# Patient Record
Sex: Male | Born: 1945 | Race: White
Health system: Southern US, Community
[De-identification: ages and names within clinical notes are randomized; demographics above are authoritative.]

## PROBLEM LIST (undated history)

## (undated) VITALS — BP 199/94 | HR 69 | Temp 97.8°F | Resp 16

## (undated) VITALS — BP 220/136 | HR 77 | Temp 98.0°F | Resp 14

## (undated) DIAGNOSIS — H3552 Pigmentary retinal dystrophy: Secondary | ICD-10-CM

## (undated) DIAGNOSIS — I10 Essential (primary) hypertension: Secondary | ICD-10-CM

## (undated) DIAGNOSIS — E039 Hypothyroidism, unspecified: Secondary | ICD-10-CM

## (undated) DIAGNOSIS — H547 Unspecified visual loss: Secondary | ICD-10-CM

## (undated) DIAGNOSIS — H9190 Unspecified hearing loss, unspecified ear: Secondary | ICD-10-CM

## (undated) DIAGNOSIS — K5792 Diverticulitis of intestine, part unspecified, without perforation or abscess without bleeding: Secondary | ICD-10-CM

## (undated) DIAGNOSIS — G4700 Insomnia, unspecified: Secondary | ICD-10-CM

## (undated) DIAGNOSIS — E785 Hyperlipidemia, unspecified: Secondary | ICD-10-CM

## (undated) DIAGNOSIS — H93233 Hyperacusis, bilateral: Secondary | ICD-10-CM

## (undated) DIAGNOSIS — M545 Low back pain, unspecified: Secondary | ICD-10-CM

## (undated) DIAGNOSIS — I2510 Atherosclerotic heart disease of native coronary artery without angina pectoris: Secondary | ICD-10-CM

## (undated) HISTORY — DX: Insomnia, unspecified: G47.00

## (undated) HISTORY — DX: Atherosclerotic heart disease of native coronary artery without angina pectoris: I25.10

## (undated) HISTORY — DX: Hyperlipidemia, unspecified: E78.5

## (undated) HISTORY — DX: Low back pain, unspecified: M54.50

## (undated) HISTORY — DX: Diverticulitis of intestine, part unspecified, without perforation or abscess without bleeding: K57.92

## (undated) HISTORY — DX: Unspecified visual loss: H54.7

## (undated) HISTORY — DX: Hypothyroidism, unspecified: E03.9

## (undated) HISTORY — PX: OTHER SURGICAL HISTORY: SHX169

## (undated) HISTORY — DX: Essential (primary) hypertension: I10

---

## 1971-07-27 HISTORY — PX: APPENDECTOMY (OPEN): SHX54

## 1977-07-26 HISTORY — PX: EYE SURGERY: SHX253

## 1986-07-26 HISTORY — PX: OTHER SURGICAL HISTORY: SHX169

## 1989-07-26 HISTORY — PX: OTHER SURGICAL HISTORY: SHX169

## 1991-07-27 DIAGNOSIS — H9319 Tinnitus, unspecified ear: Secondary | ICD-10-CM

## 1991-07-27 HISTORY — DX: Tinnitus, unspecified ear: H93.19

## 1998-11-06 ENCOUNTER — Ambulatory Visit (HOSPITAL_COMMUNITY): Admission: RE | Admit: 1998-11-06 | Discharge: 1998-11-06 | Payer: Self-pay | Admitting: Internal Medicine

## 2000-02-23 ENCOUNTER — Encounter: Admission: RE | Admit: 2000-02-23 | Discharge: 2000-02-23 | Payer: Self-pay | Admitting: Internal Medicine

## 2000-02-23 ENCOUNTER — Encounter: Payer: Self-pay | Admitting: Internal Medicine

## 2000-07-27 ENCOUNTER — Ambulatory Visit
Admit: 2000-07-27 | Disposition: A | Discharge status: Home / Self Care | Payer: Self-pay | Source: Ambulatory Visit | Admitting: Internal Medicine

## 2000-09-23 HISTORY — PX: ROTATOR CUFF REPAIR: SHX139

## 2003-04-22 ENCOUNTER — Ambulatory Visit (HOSPITAL_COMMUNITY): Admission: RE | Admit: 2003-04-22 | Discharge: 2003-04-22 | Payer: Self-pay | Admitting: Internal Medicine

## 2003-11-25 ENCOUNTER — Ambulatory Visit: Admission: RE | Admit: 2003-11-25 | Payer: Self-pay | Source: Ambulatory Visit | Admitting: Gastroenterology

## 2004-12-25 ENCOUNTER — Encounter: Admission: RE | Admit: 2004-12-25 | Discharge: 2004-12-25 | Payer: Self-pay | Admitting: Internal Medicine

## 2005-01-25 ENCOUNTER — Encounter: Admission: RE | Admit: 2005-01-25 | Discharge: 2005-01-25 | Payer: Self-pay | Admitting: Internal Medicine

## 2005-01-27 ENCOUNTER — Encounter: Admission: RE | Admit: 2005-01-27 | Discharge: 2005-01-27 | Payer: Self-pay | Admitting: Internal Medicine

## 2005-03-25 ENCOUNTER — Encounter: Admission: RE | Admit: 2005-03-25 | Discharge: 2005-03-25 | Payer: Self-pay | Admitting: Internal Medicine

## 2005-06-03 ENCOUNTER — Encounter: Admission: RE | Admit: 2005-06-03 | Discharge: 2005-06-03 | Payer: Self-pay | Admitting: Internal Medicine

## 2005-07-20 ENCOUNTER — Encounter: Admission: RE | Admit: 2005-07-20 | Discharge: 2005-07-20 | Payer: Self-pay | Admitting: Internal Medicine

## 2005-07-26 HISTORY — PX: HYDROCELE EXCISION: SHX482

## 2005-08-18 ENCOUNTER — Ambulatory Visit (HOSPITAL_BASED_OUTPATIENT_CLINIC_OR_DEPARTMENT_OTHER): Admission: RE | Admit: 2005-08-18 | Discharge: 2005-08-18 | Payer: Self-pay | Admitting: Urology

## 2005-10-21 ENCOUNTER — Encounter: Admission: RE | Admit: 2005-10-21 | Discharge: 2005-10-21 | Payer: Self-pay | Admitting: Internal Medicine

## 2005-11-05 ENCOUNTER — Ambulatory Visit (HOSPITAL_COMMUNITY): Admission: RE | Admit: 2005-11-05 | Discharge: 2005-11-05 | Payer: Self-pay | Admitting: Internal Medicine

## 2006-11-22 ENCOUNTER — Ambulatory Visit: Admission: RE | Admit: 2006-11-22 | Payer: Self-pay | Source: Ambulatory Visit | Admitting: Surgery

## 2006-12-25 HISTORY — PX: EXCISION, SEBACEOUS CYST: SHX5759

## 2006-12-25 HISTORY — PX: EXCISION, CYST: SHX3959

## 2006-12-30 IMAGING — CT CT CHEST W/ CM
1 of 2 series · 15 of 31 positions shown, 19 images · IV contrast (75CC OMNI 300)
Comparison: [REDACTED] chest x-ray 12/25/04.

CLINICAL DATA: Cough.  Chest x-ray subpleural left upper lobe density.
LIMITED CT OF PARANASAL SINUSES:
TECHNIQUE: Limited coronal CT images were obtained through the paranasal sinuses without intravenous contrast.
TECHNIQUE: Multidetector CT imaging of the chest was performed following the standard protocol during bolus administration of intravenous contrast.
Contrast:  75 cc Omnipaque 300

[Series 4: routine chest · axial · 0.70mm/px · z∈[-300,+45]mm · 15 of 77 slices shown, 19 images]
[im 4/77  mediastinal]
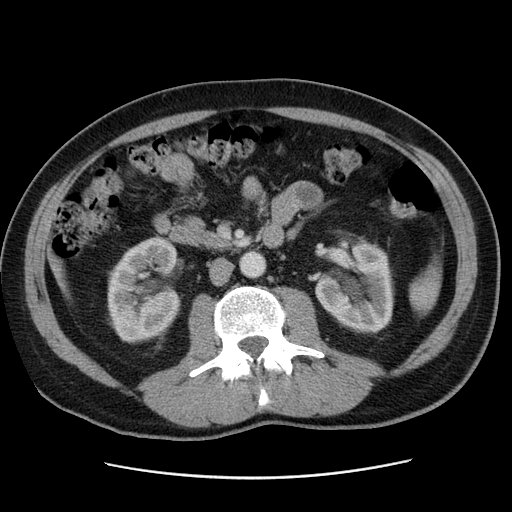
[im 4/77  lung]
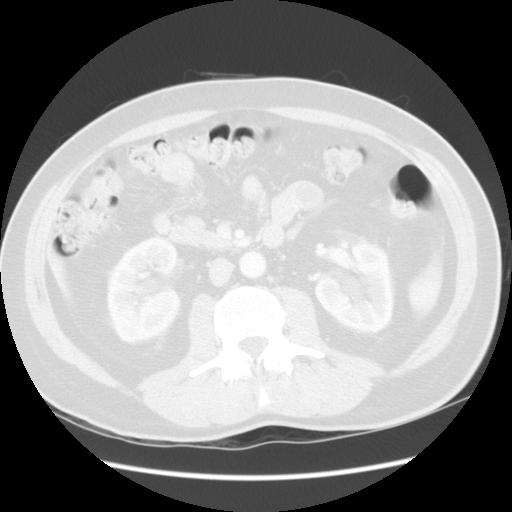
[im 11/77  lung]
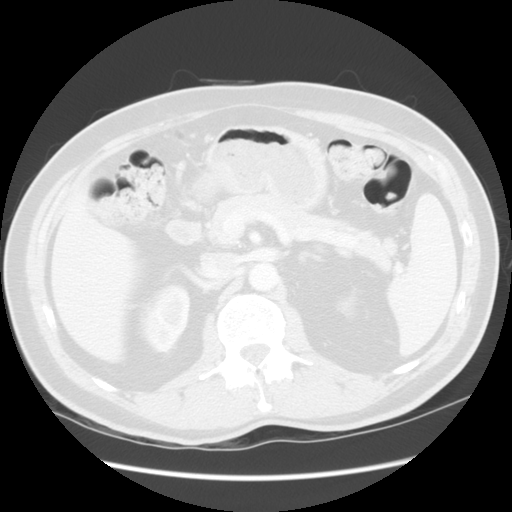
[im 15/77  lung]
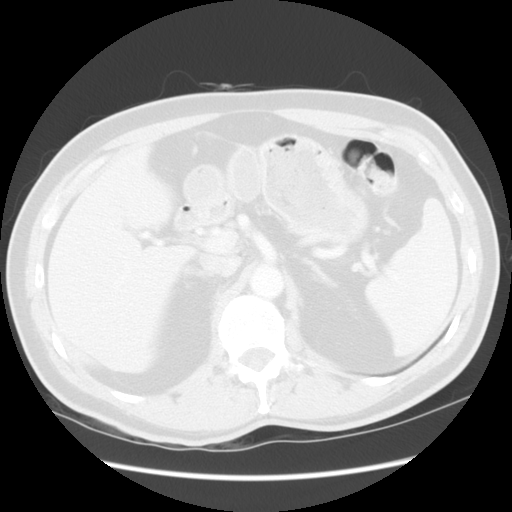
[im 20/77  lung]
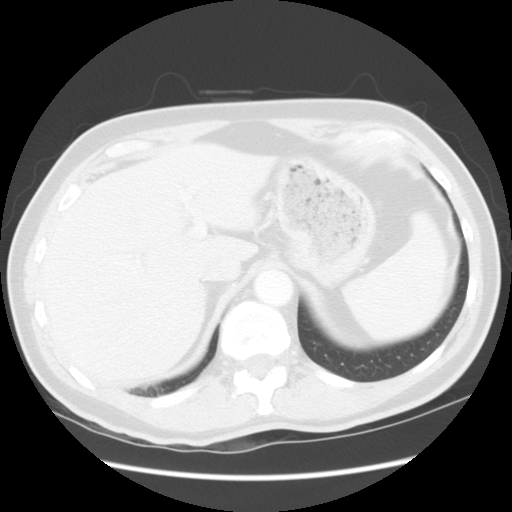
[im 22/77  mediastinal]
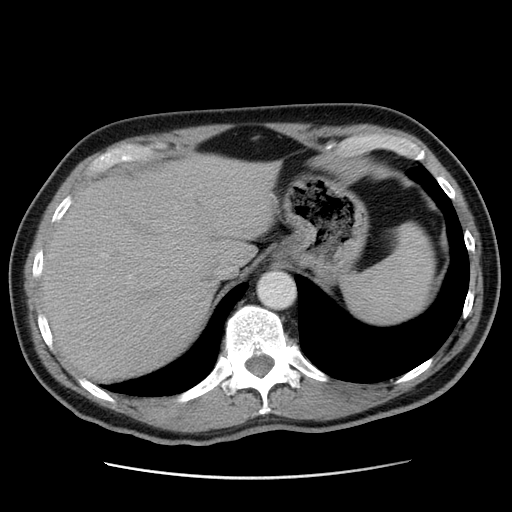
[im 22/77  lung]
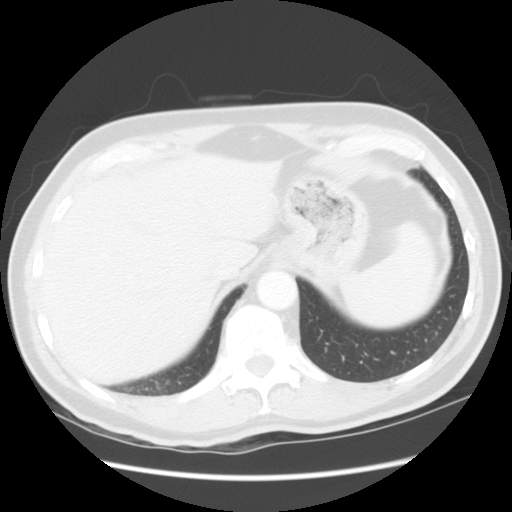
[im 29/77  lung]
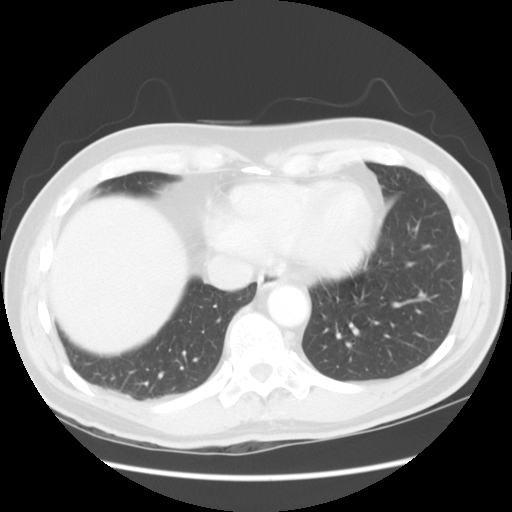
[im 33/77  lung]
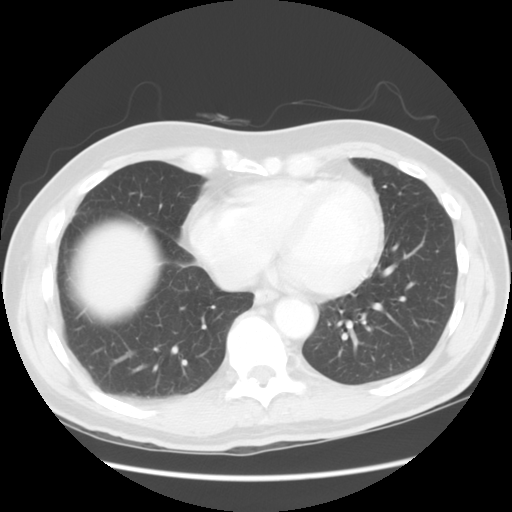
[im 39/77  lung]
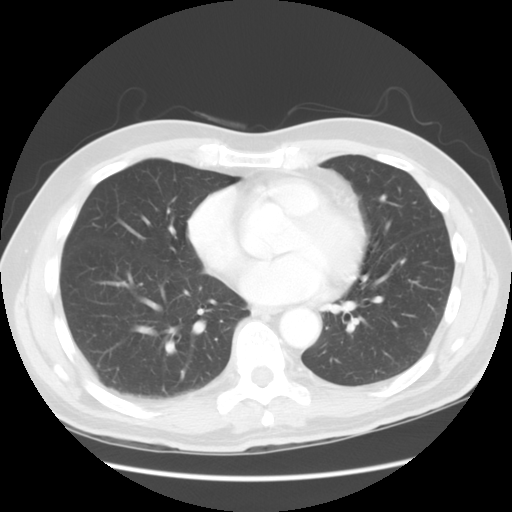
[im 41/77  mediastinal]
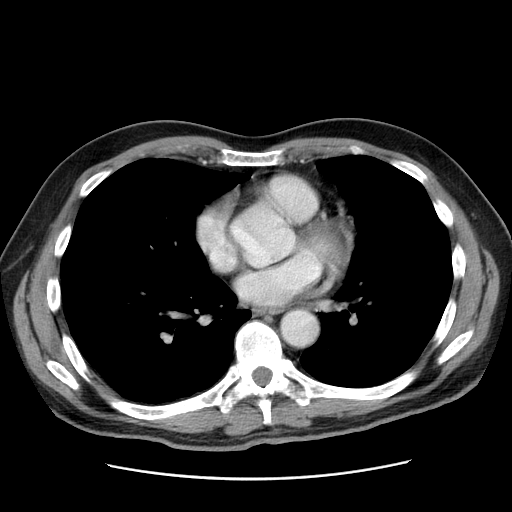
[im 41/77  lung]
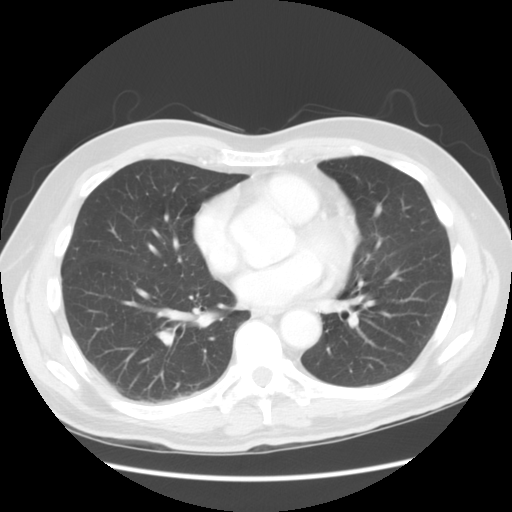
[im 44/77  lung]
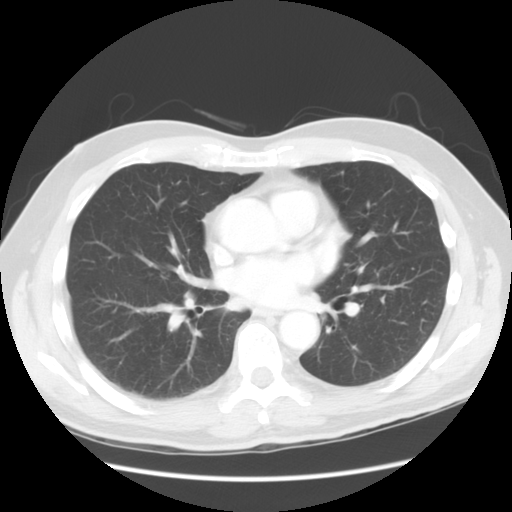
[im 51/77  lung]
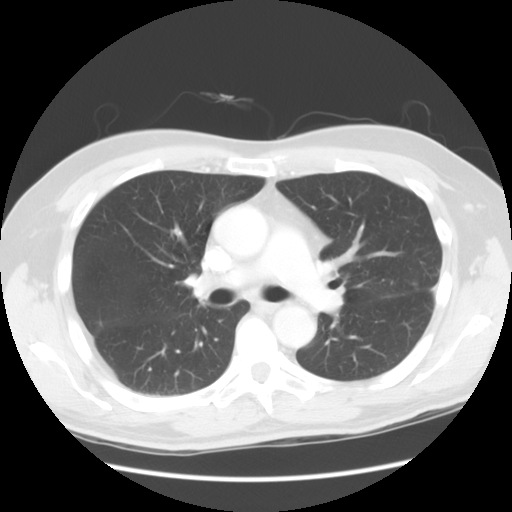
[im 55/77  lung]
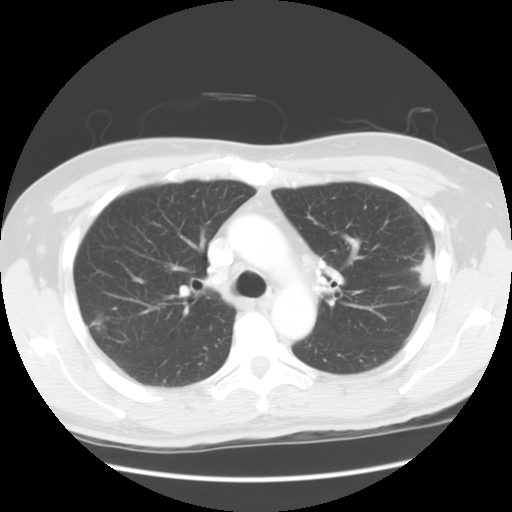
[im 62/77  mediastinal]
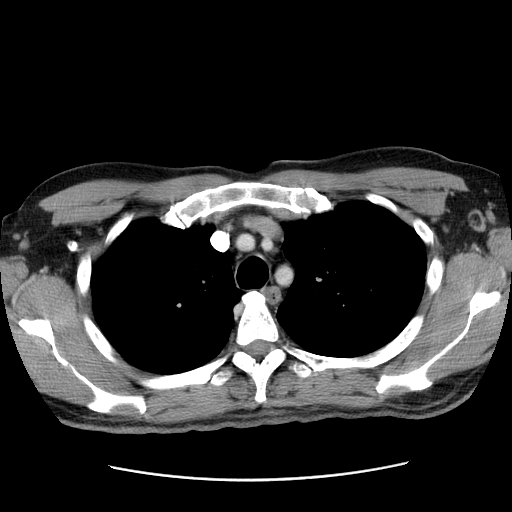
[im 62/77  lung]
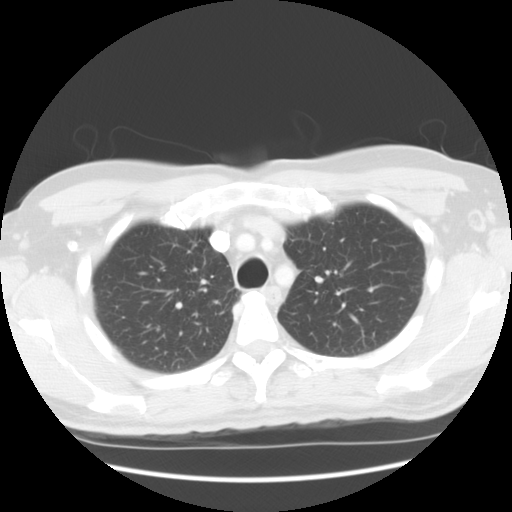
[im 66/77  lung]
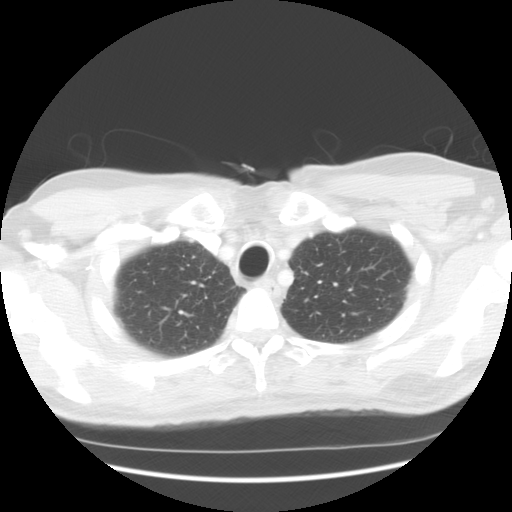
[im 73/77  lung]
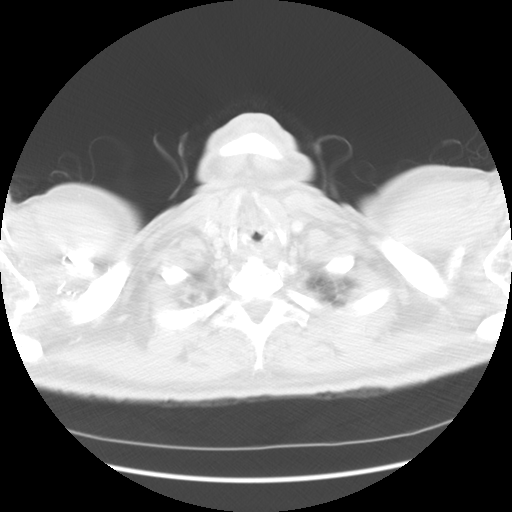

[15 of 31 positions shown; findings below may reference images not displayed]

FINDINGS: The paranasal sinuses are clear.  There is no evidence of mucosal thickening or air-fluid levels.  No significant bone abnormalities are identified.  Small right concha bullosa and slight leftward nasal septal deviation anatomic variants are seen.
IMPRESSION: 1.  Small right concha bullosa and slight leftward nasal septal deviation.
2.  Otherwise negative.
CHEST CT WITH CONTRAST:
FINDINGS: Since prior study there is approximately 2.5 cm long x 2.7 cm AP x 1.5 cm wide subpleural opacity at the left upper lobe (image 23).  Linear interstitial changes or minimal atelectasis are seen at the inferior medial aspect of this density.  Group of tiny fibronodular changes are seen at the subpleural inferior right upper lobe.  The lungs are otherwise clear.  No significant mediastinal, hilar, nor axillary mass/adenopathy is seen.  Heart size is normal.  3 mm benign calcification (probable granuloma) is seen at the right cardiophrenic angle (image 46).  Upper abdominal organs are unremarkable.
IMPRESSION: 1.  Chest x-ray finding corresponds to peripheral subpleural opacity as described.  Differential diagnosis includes both infectious inflammatory and neoplastic etiology.  ollowup chest CT in two to three months is advised as clinically indicated.
2.  Fibronodular changes ? probable postinflammatory at the right upper lobe. 
3.  Otherwise no significant abnormality.

## 2009-09-22 ENCOUNTER — Emergency Department: Admit: 2009-09-22 | Payer: Self-pay | Source: Emergency Department | Admitting: Emergency Medicine

## 2009-09-22 LAB — BASIC METABOLIC PANEL
BUN: 16 mg/dL (ref 8–20)
CO2: 28 mEq/L (ref 21–30)
Calcium: 9.3 mg/dL (ref 8.6–10.2)
Chloride: 101 mEq/L (ref 98–107)
Creatinine: 0.8 mg/dL (ref 0.6–1.5)
Glucose: 109 mg/dL — ABNORMAL HIGH (ref 70–100)
Potassium: 3.6 mEq/L (ref 3.6–5.0)
Sodium: 140 mEq/L (ref 136–146)

## 2009-09-22 LAB — CK: Creatine Kinase (CK): 125 U/L (ref 20–297)

## 2009-09-22 LAB — CBC AND DIFFERENTIAL
Baso(Absolute): 0 10*3/uL (ref 0.00–0.20)
Basophils: 0 % (ref 0–2)
Eosinophils Absolute: 0.2 10*3/uL (ref 0.00–0.70)
Eosinophils: 3 % (ref 0–5)
Hematocrit: 45.9 % (ref 42.0–52.0)
Hgb: 15.2 g/dL (ref 13.0–17.0)
Immature Granulocytes Absolute: 0.01 10*3/uL
Immature Granulocytes: 0 % (ref 0–1)
Lymphocytes Absolute: 1.6 10*3/uL (ref 0.50–4.40)
Lymphocytes: 27 % (ref 15–41)
MCH: 30.2 pg (ref 28.0–32.0)
MCHC: 33.1 g/dL (ref 32.0–36.0)
MCV: 91.3 fL (ref 80.0–100.0)
MPV: 10.3 fL (ref 9.4–12.3)
Monocytes Absolute: 0.4 10*3/uL (ref 0.00–1.20)
Monocytes: 7 % (ref 0–11)
Neutrophils Absolute: 3.7 10*3/uL
Neutrophils: 62 % (ref 52–75)
Platelets: 229 10*3/uL (ref 140–400)
RBC: 5.03 10*6/uL (ref 4.70–6.00)
RDW: 14 % (ref 12–15)
WBC: 5.87 10*3/uL (ref 3.50–10.80)

## 2009-09-22 LAB — I-STAT TROPONIN: i-STAT Troponin: 0.01 ng/mL (ref 0.00–0.09)

## 2009-09-22 LAB — GFR: EGFR: >60

## 2010-08-15 ENCOUNTER — Encounter: Payer: Self-pay | Admitting: Internal Medicine

## 2010-08-16 ENCOUNTER — Encounter: Payer: Self-pay | Admitting: Internal Medicine

## 2010-12-19 ENCOUNTER — Encounter: Payer: Self-pay | Admitting: Gastroenterology

## 2011-02-24 HISTORY — PX: COLONOSCOPY, DIAGNOSTIC (SCREENING): SHX174

## 2011-03-25 ENCOUNTER — Ambulatory Visit
Admission: RE | Admit: 2011-03-25 | Discharge: 2011-03-25 | Payer: Self-pay | Source: Ambulatory Visit | Attending: Gastroenterology | Admitting: Gastroenterology

## 2011-03-25 ENCOUNTER — Ambulatory Visit: Payer: Self-pay

## 2011-04-10 LAB — ECG 12-LEAD
Atrial Rate: 57 {beats}/min
P Axis: 50 degrees
P-R Interval: 142 ms
Q-T Interval: 432 ms
QRS Duration: 78 ms
QTC Calculation (Bezet): 420 ms
R Axis: -8 degrees
T Axis: 53 degrees
Ventricular Rate: 57 {beats}/min

## 2011-05-13 NOTE — Op Note (Signed)
DATE OF BIRTH:                        04/13/1946      ADMISSION DATE:                     11/22/2006            PATIENT LOCATION:                     [id number]            DATE OF PROCEDURE:                   11/22/2006      SURGEON:                            Frederich Chick, MD      ASSISTANT(S):                  PREOPERATIVE DIAGNOSIS:  EPIDERMAL INCLUSION CYSTS OF THE BACK (X4).            POSTOPERATIVE DIAGNOSIS:  EPIDERMAL INCLUSION CYSTS OF THE BACK (X4).            PROCEDURE:  INCISIONAL BIOPSY OF EPIDERMAL INCLUSION CYSTS OF THE BACK X4.            ANESTHESIA:  LMA general anesthesia.            INDICATIONS FOR OPERATION:  The patient is a 65 year old gentleman who for      the past several years has had slowly enlarging lumps on his back.  The      largest has achieved the size of an egg yolk.  The patient presented      himself to me and I developed with him the indications and rational for      proceeding for excisional biopsies.  We discussed its potential      complications, bleeding, infection, seroma formation and the cosmetic      debits associated with the incisions.  We also talked about arrhythmias,      myocardial infarction, stroke, deep venous thrombosis, pulmonary emboli and      so forth.  The patient consented to the operation understanding the      aforementioned.            DESCRIPTION OF PROCEDURE:  The patient was given 2 g of Ancef antibiotic      prophylaxis, brought to the operating room and general LMA anesthesia      induced.  He was rolled to the prone position with banana peels      appropriately placed.  The back was prepped and draped in a standard      fashion.  I used a skin marking pen to outline the intended to elliptical      incisions about each of the lesions including the comedos were evident.  I      began with the upper back lesion and using a #15 blade, made the incision.      Flaps were raise in a cephalad and caudad direction.  When I got beyond the      limits of the  lesion, I deepened the dissection and excised the lesion in      toto.  It was submitted to pathology as a definitive specimen.  It measured  1.3 x 1.1 cm.  It was labeled epidermal inclusion cyst of the upper back      (a).  I then proceeded to excise the largest one in an identical fashion.      It was submitted as epidermal inclusion cyst mid back right (b).  I then      proceeded to excise the third one.  It was submitted as epidermal inclusion      cyst of the right lower back (c) and then did the epidermal inclusion cyst      left mid back specimen (d).  The largest measured about 4.2 x 3.5 cm.  The      other 2 measured 2.3 x 2.1 cm and 1.1 x 1.0 cm.  All wounds were closed      with multiple interrupted subcutaneous 3-0 Vicryl, the component of which      snagged the base to obliterate dead space.  All areas were infiltrated with      about 10 mL of 0.5% Marcaine for purposes of postoperative analgesia.  The      skin was approximated with multiple interrupted 4-0 nylon vertical mattress      sutures.  Bacitracin ointment was applied to the suture line.  Dry sterile      dressings were put in place and the patient returned to the recovery area      in good condition.            ESTIMATED BLOOD LOSS:  Less than 10 mL for all the lesions.            REPLACEMENT:  None.            COMPLICATIONS:  None.            COUNTS:  Sponge and needle counts reported correct x2.                                          Electronic Signing MD: Frederich Chick, MD  ([id number])            D: 11/22/2006 by Frederich Chick, MD      T: 11/22/2006 by [id number] ([id number]) ([id number])      cc:  Frederich Chick, MD          Velna Hatchet, MD

## 2011-06-07 ENCOUNTER — Ambulatory Visit
Admission: RE | Admit: 2011-06-07 | Discharge: 2011-06-07 | Disposition: A | Discharge status: Home / Self Care | Payer: Medicare Other | Source: Ambulatory Visit | Attending: Internal Medicine | Admitting: Internal Medicine

## 2011-06-07 ENCOUNTER — Other Ambulatory Visit: Payer: Self-pay | Admitting: Internal Medicine

## 2011-06-07 DIAGNOSIS — R05 Cough: Secondary | ICD-10-CM

## 2011-07-13 NOTE — Op Note (Signed)
Introduction: [id number] Document ID: [id number] -- 65 year old male      patient presents for an outpatient Colonoscopy on 03/25/2011.            Indications: Average-risk screening for colon cancer and colonic polyps      (V76.51).            Consent: The benefits, risks, and alternatives to the procedure were      discussed and informed consent was obtained from the patient.            Preparation: EKG, pulse, pulse oximetry, and blood pressure were monitored      throughout the procedure.            Medications: Fentanyl 100 mcg IV throughout the procedure. Versed 2 mg IV      throughout the procedure.            Rectal Exam: No masses.            Procedure: The colonoscope was passed through the anus under direct      visualization and was advanced without difficulty to the cecum, confirmed      by landmarks, appendiceal orifice, and ileocecal valve. The scope was      withdrawn and the mucosa was carefully examined. The quality of the      preparation was good. The views were good. The patient's toleration of the      procedure was good.            Estimated Blood Loss: Negligible.            Findings: Two polyps, measuring 4 mm and 4 mm in size, were found in the      transverse colon.   The polyps were removed by cold biopsy polypectomy.      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 Small internal hemorrhoids were      found. There was evidence of moderately severe diverticulosis in the      descending colon and sigmoid colon.  Otherwise, the colon appeared to be      normal.            Unplanned Events: There were no unplanned events.            Summary: Two polyps were found in the transverse colon (211.3); removed by      cold biopsy polypectomy. 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      Internal hemorrhoids found (455.0). Moderately severe  diverticulosis      (562.10) found in the descending colon and sigmoid colon.            Recommendations: Follow-up on the results of the biopsy specimens. Repeat      colonoscopyin 4 years if either polyp is a tubular adenoma. If neither is      adenomatous, can defer nest exam for 7 to 10 years.            Procedure Codes: [45380]Colonoscopy with biopsy            Performed By: The procedure was performed by.      Version 1, electronically signed by Dr. Hortencia Pilar on 03/25/2011 at      12:11.      [id number]

## 2011-09-16 ENCOUNTER — Ambulatory Visit: Payer: Medicare Other | Attending: Orthopaedic Surgery

## 2011-09-16 ENCOUNTER — Other Ambulatory Visit: Payer: Self-pay | Admitting: Orthopaedic Surgery

## 2011-09-16 DIAGNOSIS — IMO0002 Reserved for concepts with insufficient information to code with codable children: Secondary | ICD-10-CM

## 2011-09-16 DIAGNOSIS — M25569 Pain in unspecified knee: Secondary | ICD-10-CM | POA: Insufficient documentation

## 2011-09-16 DIAGNOSIS — M7120 Synovial cyst of popliteal space [Baker], unspecified knee: Secondary | ICD-10-CM | POA: Insufficient documentation

## 2011-09-16 DIAGNOSIS — M25469 Effusion, unspecified knee: Secondary | ICD-10-CM | POA: Insufficient documentation

## 2011-09-24 HISTORY — PX: KNEE ARTHROSCOPY: SUR90

## 2012-06-01 ENCOUNTER — Other Ambulatory Visit: Payer: Self-pay | Admitting: Internal Medicine

## 2012-07-28 NOTE — Op Note (Unsigned)
DATE OF BIRTH:                        11/29/1945      ADMISSION DATE:                     11/25/2003            PATIENT LOCATION:                     [facility] END 03            DATE OF PROCEDURE:                   11/25/2003      SURGEON:                            Homero Fellers, MD      ASSISTANT(S):                  PREOPERATIVE DIAGNOSIS:  ROUTINE COLORECTAL CANCER SCREENING IN A PATIENT      WITH NORMAL AGE-RELATED RISK FOR COLON CANCER.            POSTOPERATIVE DIAGNOSES      1.   DIMINUTIVE DESCENDING COLON POLYP.      2.   MODERATE LEFT-SIDED DIVERTICULOSIS.      3.   MILDLY CONGESTED INTERNAL HEMORRHOIDS.            PROCEDURE:  COLONOSCOPY.            INSTRUMENT:  Olympus video colonoscope.            PREMEDICATIONS:  None.            DESCRIPTION OF PROCEDURE:  Perianal inspection was unrevealing.  Digital      exam revealed no masses.  The scope was passed transanally and further      advanced to the base of the cecum (photo documentation).  The ileocecal      valve was well seen (photo documentation).  Bowel prep was good.  The scope      was slowly withdrawn.  A 3-4 mm very subtle polyp in the distal descending      colon (photo documentation) was excised with cold biopsy forceps (A).      Moderate left-sided diverticulosis was present.  The examination was      otherwise unremarkable except for mildly congested internal hemorrhoids      visualized on withdrawal.  Retroflexion in the anorectum (photo      documentation) was otherwise unrevealing.  The scope was withdrawn.  The      patient was tolerated well without overt complication.            RECOMMENDATION:  A surveillance colonoscopy will be recommended in 5 years      if the excised polyp is a tubular adenoma and in 3 years if it has any      villous component.  If it is nonadenomatous, a next screening colonoscopy      may be deferred for up to 10 years according to current consensus      guidelines.  ___________________________________          Date Signed: __________      Homero Fellers, MD  ([id number])            D: 11/25/2003 by Homero Fellers, MD      T: 11/25/2003 by [id number] ([id number]) ([id number])      cc:  Velna Hatchet, MD          Homero Fellers, MD

## 2012-11-28 ENCOUNTER — Other Ambulatory Visit: Payer: Self-pay | Admitting: Internal Medicine

## 2013-01-15 ENCOUNTER — Other Ambulatory Visit: Payer: Self-pay | Admitting: Internal Medicine

## 2013-01-15 NOTE — Telephone Encounter (Signed)
Contact pt please, he has not established here yet and we cant keep prescribing sleep meds,     He needs to make an appt    TY    Althea Grimmer. [name], M.D.

## 2013-01-16 MED ORDER — ESZOPICLONE 2 MG PO TABS
2.0000 mg | ORAL_TABLET | Freq: Every evening | ORAL | Status: DC
Start: 2013-01-15 — End: 2013-04-19

## 2013-01-17 ENCOUNTER — Encounter: Payer: Self-pay | Admitting: Internal Medicine

## 2013-01-17 NOTE — Progress Notes (Signed)
Eszopiclone 2 mg tablet called into [facility] on file. Pt did not receive mail order rx yet. Verbal order given for 7 tablets with no refill.

## 2013-01-18 ENCOUNTER — Encounter: Payer: Self-pay | Admitting: Internal Medicine

## 2013-01-18 ENCOUNTER — Ambulatory Visit: Payer: Medicare Other | Admitting: Internal Medicine

## 2013-01-18 VITALS — BP 180/90 | HR 76 | Temp 98.0°F | Ht 74.0 in | Wt 188.0 lb

## 2013-01-18 DIAGNOSIS — G4700 Insomnia, unspecified: Secondary | ICD-10-CM

## 2013-01-18 DIAGNOSIS — IMO0001 Reserved for inherently not codable concepts without codable children: Secondary | ICD-10-CM

## 2013-01-18 DIAGNOSIS — E785 Hyperlipidemia, unspecified: Secondary | ICD-10-CM

## 2013-01-18 DIAGNOSIS — H9313 Tinnitus, bilateral: Secondary | ICD-10-CM

## 2013-01-18 NOTE — Progress Notes (Signed)
Subjective:       Patient ID: Frederick [name] is a 67 y.o. male.    HPI    This 66 year old male presents to the office for a refill of medication for sleep. He has a chronic problem with insomnia and has required Lunesta. The patient was last seen in March, 2013. Due to running out of medication, he has not been sleeping and apparently his blood pressure has elevated. The patient has a history of labile pressures but it has never been consistently high and he has not been on antihypertensives.    The following portions of the patient's history were reviewed and updated as appropriate: medical history, allergies, medication list, social history, and problem list.      Review of Systems   Constitutional: Positive for fatigue.   HENT: Positive for hearing loss and tinnitus.    Respiratory: Negative for chest tightness and shortness of breath.    Cardiovascular: Negative for palpitations.   Neurological: Negative for light-headedness and headaches.   All other systems reviewed and are negative.            Objective:    Physical Exam   Vitals reviewed.  Constitutional: He is oriented to person, place, and time. He appears well-developed and well-nourished.   HENT:   Head: Normocephalic and atraumatic.   Right Ear: Tympanic membrane normal. No decreased hearing is noted.   Left Ear: Tympanic membrane normal. No decreased hearing is noted.   Eyes: Pupils are equal, round, and reactive to light.   Cardiovascular: Normal rate, regular rhythm and normal heart sounds.    Pulmonary/Chest: Effort normal and breath sounds normal.   Abdominal: Soft. Bowel sounds are normal.   Musculoskeletal: He exhibits no edema.   Neurological: He is alert and oriented to person, place, and time.   Skin: Skin is warm and dry.   Psychiatric: He has a normal mood and affect.           Assessment:       Insomnia  Labile blood pressure  Chronic tinnitus  Hyperlipidemia      Plan:       Patient has been provided a refill of his sleeping medication  - Lunesta. After getting back to a more normal sleep pattern, he is to keep a log of blood pressure readings. He will return to the office for re-evaluation and updated lab work.    Althea Grimmer. [name], M.D.    [facility]  [address]  [place], [place] [address]    Office [phone]  Fax     [phone]

## 2013-02-22 ENCOUNTER — Ambulatory Visit: Payer: Medicare Other | Admitting: Internal Medicine

## 2013-02-22 ENCOUNTER — Encounter: Payer: Self-pay | Admitting: Internal Medicine

## 2013-02-22 VITALS — BP 150/90 | HR 76 | Ht 74.0 in | Wt 188.0 lb

## 2013-02-22 DIAGNOSIS — G4700 Insomnia, unspecified: Secondary | ICD-10-CM

## 2013-02-22 DIAGNOSIS — H9313 Tinnitus, bilateral: Secondary | ICD-10-CM

## 2013-02-22 DIAGNOSIS — Z125 Encounter for screening for malignant neoplasm of prostate: Secondary | ICD-10-CM

## 2013-02-22 DIAGNOSIS — R0989 Other specified symptoms and signs involving the circulatory and respiratory systems: Secondary | ICD-10-CM

## 2013-02-22 DIAGNOSIS — M720 Palmar fascial fibromatosis [Dupuytren]: Secondary | ICD-10-CM

## 2013-02-22 DIAGNOSIS — E785 Hyperlipidemia, unspecified: Secondary | ICD-10-CM

## 2013-02-22 DIAGNOSIS — R030 Elevated blood-pressure reading, without diagnosis of hypertension: Secondary | ICD-10-CM

## 2013-02-22 DIAGNOSIS — H93233 Hyperacusis, bilateral: Secondary | ICD-10-CM

## 2013-02-22 DIAGNOSIS — H93239 Hyperacusis, unspecified ear: Secondary | ICD-10-CM

## 2013-02-22 DIAGNOSIS — F5104 Psychophysiologic insomnia: Secondary | ICD-10-CM

## 2013-02-22 DIAGNOSIS — H9319 Tinnitus, unspecified ear: Secondary | ICD-10-CM

## 2013-02-22 NOTE — Progress Notes (Signed)
Subjective:       Patient ID: Frederick [name] is a 67 y.o. male.    HPI      This 67 year old male, well known to me, presents to the office for evaluation of multiple medical issues. He does not smoke and consumes alcohol in moderation. He has resumed efforts to work on diet, weight management, and exercise.    Patient Active Problem List   Diagnosis   . Hyperlipidemia       Lipid profile managed with atorvastatin 10 mg and attention to diet. No apparent adverse effects of the statin agent.     . Insomnia       Chronic sleep disorder, most successfully controled with use of Lunesta qhs.     . Tinnitus        Likely related to noise exposure, persistent and very disruptive, contributes to sleep issues     . Labile blood pressure        Patient brings in an extensive log of recent readings which shows a much better average that readings here. Salt and caffeine intake are limited. Denies headaches, dizziness, chest pain, sob, fluid retention     . Dupuytren's contracture of both hands        Has had prior procedures. Problem is recurrent with flexion deformities of 4th and 5th digits of both hands     . Hyperacusis of both ears       Heightened sensitivity at certain frequencies despite general hearing loss         The following portions of the patient's history were reviewed and updated as appropriate: medical history, allergies, medication list, social history, family history, and problem list.      Review of Systems   Constitutional: Positive for fatigue. Negative for fever and unexpected weight change.   HENT: Positive for hearing loss and tinnitus. Negative for ear pain.    Respiratory: Negative for chest tightness and shortness of breath.    Cardiovascular: Negative for chest pain, palpitations and leg swelling.   Genitourinary: Negative for difficulty urinating.   Neurological: Negative for dizziness, light-headedness and headaches.   All other systems reviewed and are negative.            Objective:     Physical Exam   Vitals reviewed.  Constitutional: He is oriented to person, place, and time. He appears well-developed and well-nourished.   HENT:   Head: Normocephalic and atraumatic.   Right Ear: Decreased hearing is noted.   Left Ear: Decreased hearing is noted.   Mouth/Throat: Oropharynx is clear and moist.   Eyes: EOM are normal. Pupils are equal, round, and reactive to light.   Neck: Normal range of motion. Neck supple. No thyromegaly present.   Cardiovascular: Normal rate, regular rhythm and normal heart sounds.    Pulmonary/Chest: Effort normal and breath sounds normal.   Abdominal: Soft. Bowel sounds are normal. He exhibits no mass. There is no tenderness.   Genitourinary: Testes normal and penis normal.   Musculoskeletal: Normal range of motion.        Bilateral dupuytren contractures, most notable - fourth and fifth digits   Lymphadenopathy:     He has no cervical adenopathy.   Neurological: He is alert and oriented to person, place, and time.   Skin: Skin is warm and dry.   Psychiatric: He has a normal mood and affect.           Assessment:  Hyperlipidemia  Labile blood pressure  Chronic insomnia  Tinnitus  Hyperacusis  Recurrent Dupuytren's contractures involving the digits of both hands      Plan:       An update of the patient's database will be obtained, to include: EKG, complete blood count, comprehensive metabolic panel, lipid profile, PSA, and urinalysis.  The patient is provided a list of hand surgeons in the area as it is likely that he will require surgical release of several digits. I have discussed the possibility of medication such as a low dose beta blocker to address his blood pressure readings which did not appear to be quite at target. The patient insists on continuing his efforts on diet, weight, and exercise for a longer period of time since he has had some success in the past with these lifestyle changes. He'll continue to keep a log of readings and I have suggested he return in  3-4 months for a reassessment of his clinical status. Should there be any notable changes or new concerns in the meantime, he will contact this office.    Althea Grimmer. [name], M.D.      [facility]  [address]  [place], [place] [address]    Office [phone]  Fax     [phone]

## 2013-02-24 LAB — URINALYSIS REFLEX TO MICROSCOPIC EXAM - REFLEX TO CULTURE
Bilirubin, UA: NEGATIVE
Blood, UA: NEGATIVE
Glucose Qualitative: NEGATIVE
Hyaline Casts, UA: NONE SEEN
Leukocyte Esterase, UA: NEGATIVE
NITRITE: NEGATIVE
RBC UA: NONE SEEN (ref 0–3)
Specific Gravity, UA: 1.02 (ref 1.001–1.035)
Squam Epithel, UA: NONE SEEN (ref 0–5)
Urine Bacteria: NONE SEEN
WBC: NONE SEEN (ref 0–5)
pH: 6 (ref 5.0–8.0)

## 2013-02-24 LAB — LIPID PANEL
Cholesterol / HDL Ratio: 2.5 (ref 0.0–5.0)
Cholesterol: 158 MG/DL (ref 125–200)
HDL: 62 mg/dL (ref >=40)
LDL Calculated: 82 mg/dL (ref <130)
Non HDL Cholesterol (LDL and VLDL): 96 mg/dL
Triglycerides: 69 MG/DL (ref <150)

## 2013-02-24 LAB — CBC AND DIFFERENTIAL
Atypical Lymphocytes %: 0 %
Baso(Absolute): 18 cells/uL (ref 0–200)
Basophils: 0.4 %
Eosinophils Absolute: 45 cells/uL (ref 15–500)
Eosinophils: 1 %
Hematocrit: 48.1 % (ref 38.5–50.0)
Hemoglobin: 16 g/dL (ref 13.2–17.1)
Lymphocytes Absolute: 729 cells/uL — ABNORMAL LOW (ref 850–3900)
Lymphocytes: 16.2 %
MCH: 31.7 pg (ref 27–33)
MCHC: 33.3 g/dL (ref 32–36)
MCV: 95 fL (ref 80–100)
MPV: 8 fL (ref 7.5–11.5)
Monocytes Absolute: 212 cells/uL (ref 200–950)
Monocytes: 4.7 %
Neutrophils Absolute: 3497 cells/uL (ref 1500–7800)
Neutrophils: 77.7 %
Platelets: 231 10*3/uL (ref 140–400)
RBC: 5.06 10*6/uL (ref 4.20–5.80)
RDW: 14.3 % (ref 11.0–15.0)
WBC: 4.5 10*3/uL (ref 3.8–10.8)

## 2013-02-24 LAB — COMPREHENSIVE METABOLIC PANEL
ALT: 30 U/L (ref 9–46)
AST (SGOT): 27 U/L (ref 10–35)
Albumin/Globulin Ratio: 1.8 (ref 1.0–2.5)
Albumin: 4.8 G/DL (ref 3.6–5.1)
Alkaline Phosphatase: 52 U/L (ref 40–115)
BUN: 13 MG/DL (ref 7–25)
Bilirubin, Total: 2.3 MG/DL — ABNORMAL HIGH (ref 0.2–1.2)
CO2: 22 mmol/L (ref 19–30)
Calcium: 9.8 MG/DL (ref 8.6–10.3)
Chloride: 103 mmol/L (ref 98–110)
Creatinine: 0.84 mg/dL (ref 0.70–1.25)
EGFR African American: 105 mL/min/{1.73_m2} (ref >=60)
EGFR: 91 mL/min/{1.73_m2} (ref >=60)
Globulin: 2.7 G/DL (ref 1.9–3.7)
Glucose: 89 MG/DL (ref 65–99)
Potassium: 4.6 mmol/L (ref 3.5–5.3)
Protein, Total: 7.5 G/DL (ref 6.1–8.1)
Sodium: 140 mmol/L (ref 135–146)

## 2013-02-24 LAB — PSA: Prostate Specific Antigen, Total: 1.3 ng/mL (ref 0.0–4.0)

## 2013-02-25 ENCOUNTER — Encounter: Payer: Self-pay | Admitting: Internal Medicine

## 2013-03-26 HISTORY — PX: RELEASE, DUPUYTREN'S CONTRACTURE: SHX5076

## 2013-04-10 ENCOUNTER — Ambulatory Visit: Payer: Medicare Other | Admitting: Internal Medicine

## 2013-04-10 ENCOUNTER — Encounter: Payer: Self-pay | Admitting: Internal Medicine

## 2013-04-10 VITALS — BP 170/90 | HR 75 | Temp 98.4°F | Wt 189.0 lb

## 2013-04-10 DIAGNOSIS — IMO0001 Reserved for inherently not codable concepts without codable children: Secondary | ICD-10-CM

## 2013-04-10 DIAGNOSIS — M720 Palmar fascial fibromatosis [Dupuytren]: Secondary | ICD-10-CM

## 2013-04-10 DIAGNOSIS — Z01818 Encounter for other preprocedural examination: Secondary | ICD-10-CM

## 2013-04-10 DIAGNOSIS — R030 Elevated blood-pressure reading, without diagnosis of hypertension: Secondary | ICD-10-CM

## 2013-04-10 MED ORDER — ATORVASTATIN CALCIUM 10 MG PO TABS
10.0000 mg | ORAL_TABLET | Freq: Every day | ORAL | Status: DC
Start: 2013-04-10 — End: 2013-11-07

## 2013-04-10 MED ORDER — METOPROLOL SUCCINATE ER 25 MG PO TB24
25.0000 mg | ORAL_TABLET | Freq: Every day | ORAL | Status: DC
Start: 2013-04-10 — End: 2013-05-15

## 2013-04-10 NOTE — Progress Notes (Signed)
Subjective:       Patient ID: Frederick [name] is a 67 y.o. male.    HPI                          PREOPERATIVE MEDICAL CLEARANCE         This 67 year old male, well-known to me, presents to today for evaluation in anticipation of surgical release of Dupuytren's contractures of the right hand, specifically the second, fourth, and fifth digits. This procedure is scheduled for April 20, 2013 by Dr. Patty Sermons.        The patient has no personal history of cardiopulmonary disease or diabetes, does not smoke, and consumes alcohol in moderation. He does have a history of labile blood pressure and seems to be particularly prone to elevation when in the health care environment. In the past, his home monitoring has demonstrated an acceptable average pressure. History is negative for cardiac disease or diabetes.    Patient Active Problem List   Diagnosis   . Hyperlipidemia   . Insomnia   . Tinnitus   . Labile blood pressure   . Dupuytren's contracture of both hands   . Hyperacusis of both ears       Current Outpatient Prescriptions   Medication Sig Dispense Refill   . aspirin EC 81 MG EC tablet Take 81 mg by mouth daily.       Marland Kitchen atorvastatin (LIPITOR) 10 MG tablet Take 1 tablet (10 mg total) by mouth daily.  90 tablet  1   . eszopiclone (LUNESTA) 2 MG tablet Take 1 tablet (2 mg total) by mouth nightly. Take immediately before bedtime  90 tablet  0   . metoprolol XL (TOPROL XL) 25 MG 24 hr tablet Take 1 tablet (25 mg total) by mouth daily.  30 tablet  0       The following portions of the patient's history were reviewed and updated as appropriate: medical history, allergies, medication list, social history, family history, and problem list.      Review of Systems   Constitutional: Negative for fever, activity change, fatigue and unexpected weight change.   HENT: Positive for hearing loss and tinnitus.    Respiratory: Negative for chest tightness and shortness of breath.    Cardiovascular: Negative for chest pain,  palpitations and leg swelling.   Gastrointestinal: Negative for abdominal pain.   Genitourinary: Negative for difficulty urinating.   Musculoskeletal: Positive for arthralgias.   Neurological: Negative for light-headedness and headaches.   Psychiatric/Behavioral: The patient is nervous/anxious.    All other systems reviewed and are negative.        Filed Vitals:    04/10/13 0928   BP: 170/90   Pulse: 75   Temp:  Weight 98.4 F (36.9 C)  189 lb         Objective:    Physical Exam   Vitals reviewed.  Constitutional: He is oriented to person, place, and time. He appears well-developed and well-nourished.   HENT:   Head: Normocephalic and atraumatic.   Mouth/Throat: Oropharynx is clear and moist.   Eyes: EOM are normal. Pupils are equal, round, and reactive to light.   Neck: Normal range of motion. Neck supple. No thyromegaly present.   Cardiovascular: Normal rate, regular rhythm, normal heart sounds and intact distal pulses.    Pulmonary/Chest: Effort normal and breath sounds normal.   Abdominal: Soft. Bowel sounds are normal. He exhibits no mass. There is no tenderness.  Musculoskeletal: He exhibits no edema.        Flexion contractures, fingers   Lymphadenopathy:     He has no cervical adenopathy.   Neurological: He is alert and oriented to person, place, and time.   Skin: Skin is warm and dry.   Psychiatric: He has a normal mood and affect.           Assessment:       Extensive Dupuytren's contractures involving the right hand  Labile blood pressure with "white coat hypertension"      Plan:       The patient has had a recent electrocardiogram so this test is not repeated. A complete blood count and comprehensive metabolic panel is obtained. Due to the patient's labile blood pressure and tendency for elevation in healthcare settings, I am placing him on metoprolol XL 25 mg every morning. The patient will return early next week with a log of his home blood pressure readings for [place] to reevaluate. Assuming that his  pressure is in a reasonable range, I see no obstacles to proceeding with the planned surgery utilizing the anesthetic modality of choice. He should refrain from aspirin use for at least 10 days prior.      Althea Grimmer. [name], M.D.        [facility]  [address]  [place], [place] [address]    Office [phone]  Fax     [phone]

## 2013-04-11 LAB — COMPREHENSIVE METABOLIC PANEL
ALT: 28 U/L (ref 9–46)
AST (SGOT): 26 U/L (ref 10–35)
Albumin/Globulin Ratio: 1.6 (ref 1.0–2.5)
Albumin: 4.6 G/DL (ref 3.6–5.1)
Alkaline Phosphatase: 52 U/L (ref 40–115)
BUN: 13 MG/DL (ref 7–25)
Bilirubin, Total: 1.5 MG/DL — ABNORMAL HIGH (ref 0.2–1.2)
CO2: 25 mmol/L (ref 19–30)
Calcium: 9.6 MG/DL (ref 8.6–10.3)
Chloride: 103 mmol/L (ref 98–110)
Creatinine: 0.84 mg/dL (ref 0.70–1.25)
EGFR African American: 105 mL/min/{1.73_m2} (ref >=60)
EGFR: 91 mL/min/{1.73_m2} (ref >=60)
Globulin: 2.8 G/DL (ref 1.9–3.7)
Glucose: 103 MG/DL — ABNORMAL HIGH (ref 65–99)
Potassium: 4.5 mmol/L (ref 3.5–5.3)
Protein, Total: 7.4 G/DL (ref 6.1–8.1)
Sodium: 142 mmol/L (ref 135–146)

## 2013-04-11 LAB — CBC AND DIFFERENTIAL
Atypical Lymphocytes %: 0 %
Baso(Absolute): 11 cells/uL (ref 0–200)
Basophils: 0.2 %
Eosinophils Absolute: 72 cells/uL (ref 15–500)
Eosinophils: 1.3 %
Hematocrit: 46.8 % (ref 38.5–50.0)
Hemoglobin: 15.8 g/dL (ref 13.2–17.1)
Lymphocytes Absolute: 682 cells/uL — ABNORMAL LOW (ref 850–3900)
Lymphocytes: 12.4 %
MCH: 31.9 pg (ref 27–33)
MCHC: 33.8 g/dL (ref 32–36)
MCV: 94 fL (ref 80–100)
MPV: 8 fL (ref 7.5–11.5)
Monocytes Absolute: 319 cells/uL (ref 200–950)
Monocytes: 5.8 %
Neutrophils Absolute: 4417 cells/uL (ref 1500–7800)
Neutrophils: 80.3 %
Platelets: 246 10*3/uL (ref 140–400)
RBC: 4.96 10*6/uL (ref 4.20–5.80)
RDW: 13.9 % (ref 11.0–15.0)
WBC: 5.5 10*3/uL (ref 3.8–10.8)

## 2013-04-16 ENCOUNTER — Encounter: Payer: Self-pay | Admitting: Internal Medicine

## 2013-04-16 ENCOUNTER — Ambulatory Visit: Payer: Medicare Other | Admitting: Internal Medicine

## 2013-04-16 VITALS — BP 160/94 | HR 74 | Temp 97.0°F | Ht 74.0 in | Wt 189.0 lb

## 2013-04-16 DIAGNOSIS — I10 Essential (primary) hypertension: Secondary | ICD-10-CM

## 2013-04-16 NOTE — Progress Notes (Addendum)
Subjective:       Patient ID: Frederick [name] is a 67 y.o. male.    HPI   This 67 year old male presents in follow up of elevated blood pressure. He was recently seen for a pre op evaluation for hand surgery and was noted to have a significant element of "white coat hypertension". In anticipation of the procedure, he was started on metoprolol 25 mg. His log of self check readings shows a favorable trend. No side effects.    The following portions of the patient's history were reviewed and updated as appropriate: medical history, allergies, medication list, social history, family history, and problem list.      Review of Systems   Respiratory: Negative for chest tightness and shortness of breath.    Cardiovascular: Negative for chest pain and palpitations.   Neurological: Negative for dizziness, light-headedness and headaches.   All other systems reviewed and are negative.      Filed Vitals:    04/16/13 1053   BP: 160/94         Objective:    Physical Exam   Vitals reviewed.  Constitutional: He is oriented to person, place, and time. He appears well-developed and well-nourished.   Cardiovascular: Normal rate, regular rhythm and normal heart sounds.    Pulmonary/Chest: Effort normal and breath sounds normal.   Neurological: He is alert and oriented to person, place, and time.   Skin: Skin is warm and dry.           Assessment:       Hypertension      Plan:       Patient's self check readings have been very good on 25 mg of metoprolol but he is still having a bump in pressure by coming to our office, I have suggested he take 50 mg at bedtime and he should be ok for hand surgery.    Althea Grimmer. [name], M.D.      [facility]  [address]  [place], [place] [address]    Office [phone]  Fax     [phone]

## 2013-04-19 ENCOUNTER — Other Ambulatory Visit: Payer: Self-pay

## 2013-04-19 MED ORDER — ESZOPICLONE 2 MG PO TABS
2.0000 mg | ORAL_TABLET | Freq: Every evening | ORAL | Status: DC
Start: 2013-04-19 — End: 2013-05-10

## 2013-04-19 NOTE — Telephone Encounter (Signed)
Patient appeared in waiting room and hard copy was given to patient by PF.

## 2013-05-10 ENCOUNTER — Telehealth: Payer: Self-pay | Admitting: Internal Medicine

## 2013-05-10 MED ORDER — LUNESTA 2 MG PO TABS
2.0000 mg | ORAL_TABLET | Freq: Every evening | ORAL | Status: DC
Start: 2013-05-10 — End: 2013-11-06

## 2013-05-10 NOTE — Telephone Encounter (Signed)
Pt came into the office today. He states that he will most likely have surgery on his Left hand. He is asking if he should continue the metoprolol or not.     [phone] (H)  [phone] (W)

## 2013-05-10 NOTE — Telephone Encounter (Signed)
[facility], fax # [phone]

## 2013-05-15 ENCOUNTER — Other Ambulatory Visit: Payer: Self-pay | Admitting: Internal Medicine

## 2013-05-15 MED ORDER — METOPROLOL SUCCINATE ER 25 MG PO TB24
25.0000 mg | ORAL_TABLET | Freq: Every day | ORAL | Status: DC
Start: 2013-05-15 — End: 2015-08-28

## 2013-07-11 ENCOUNTER — Encounter: Payer: Self-pay | Admitting: Internal Medicine

## 2013-08-09 ENCOUNTER — Encounter: Payer: Self-pay | Admitting: Internal Medicine

## 2013-08-09 ENCOUNTER — Ambulatory Visit: Payer: Medicare Other | Admitting: Internal Medicine

## 2013-08-09 VITALS — BP 174/101 | HR 62 | Temp 97.7°F | Ht 74.0 in | Wt 192.0 lb

## 2013-08-09 DIAGNOSIS — M720 Palmar fascial fibromatosis [Dupuytren]: Secondary | ICD-10-CM

## 2013-08-09 DIAGNOSIS — Z01818 Encounter for other preprocedural examination: Secondary | ICD-10-CM | POA: Insufficient documentation

## 2013-08-09 NOTE — Progress Notes (Signed)
I am seeing this patient today at the request of Dr. Renelda Loma for pre-operative evaluation    Procedure:Dupuytrens Excision Left little ring and middle finger.  Diagnosis:Dupuytrens contracture in left little ring and middle finger.  Date:08/20/2013    Pt has no personal history of cardiac or pulmonary disease.  Pt has no personal or family history of bleeding or VTE.  Pt is able to climb a flight of stairs without stopping or having symptoms.      DPOA/Advanced Directive: on file.    Patient Active Problem List   Diagnosis   . Hyperlipidemia   . Insomnia   . Tinnitus   . Labile blood pressure   . Dupuytren's contracture of both hands   . Hyperacusis of both ears   . Dupuytren's contracture of left hand   . Preop examination       Past Surgical History   Procedure Date   . Appendectomy 1973   . Rotator cuff repair    . Release, dupuytren's contracture      right hand       Current Outpatient Prescriptions   Medication Sig Dispense Refill   . aspirin EC 81 MG EC tablet Take 81 mg by mouth daily.       Marland Kitchen atorvastatin (LIPITOR) 10 MG tablet Take 1 tablet (10 mg total) by mouth daily.  90 tablet  1   . LUNESTA 2 MG tablet Take 1 tablet (2 mg total) by mouth nightly. Take immediately before bedtime  90 tablet  1   . metoprolol XL (TOPROL XL) 25 MG 24 hr tablet Take 1 tablet (25 mg total) by mouth daily.  90 tablet  1       No Known Allergies    History   Substance Use Topics   . Smoking status: Never Smoker    . Smokeless tobacco: Never Used   . Alcohol Use: Yes       No family history on file.    Review of Systems  CONST: No wt change, no f/c/s  EYES:  No blurry vision, double vision  HENT:  No hearing loss, tinnitus, allergies, teeth problems  CV:   No CP, palpitations, leg swelling  RESP:  No SOB, cough, wheeze  GI:   No n/v, diarrhea, constipation, abd pain, heartburn, blood in stool  GU:   No dysuria, frequency, incontinence  MSK:  No joint or muscle pain  SKIN:   No rashes or concerning moles  ENDO:  No heat/cold  intolerance, polyuria, polydipsia  HEME:  No bruising/bleeding, swollen glands  NEURO:  No HA, LH, dizziness, numbness/tingling, weakness  PSYCH:  No depressed mood, anhedonia, anxiety    Physical Exam:  BP 174/101  Pulse 62  Temp 97.7 F (36.5 C)  Ht 1.88 m (6\' 2" )  Wt 87.091 kg (192 lb)  BMI 24.64 kg/m2  Wt Readings from Last 3 Encounters:   08/09/13 87.091 kg (192 lb)   04/16/13 85.73 kg (189 lb)   04/10/13 85.73 kg (189 lb)     CONST: NAD, normal weight  HENT: B TMs normal, OP clear, normal dentition  EYES: PERRL, no pallor or scleral icterus, conjunctiva not injected  NECK: supple, no TM or nodules  LYMPH: no cervical or supraclavicular LAD  CV: RRR, no m/r/g.  No LE edema.  Pedal pulses present and equal.  RESP: CTAB, normal effort  GI: soft, NT/ND, NABS, no HSM  SKIN: warm, dry.  no visible rashes or concerning moles  MSK:  Grossly normal ROM and strength in all 4 extremities,Dupuytrens contracture on left hand involving little and ring finger.  NEURO: No CN deficits, normal gait  PSYCH: A&O x3, normal mood and affect    Assessment/Recommendations:  1. Dupuytren's contracture of left hand    2. Preop examination      Check EKG,CBC,CMP.    3.Hyperlipidemia, well controlled.    4.Labile HTN ,he will take Toprol XL 50 mg instead of 25 mg the night prior to his surgery, his BP is usually well controlled in 130s/80 s.PR in 50-60 range.    There is no evidence of unstable angina, decompensated heart failure, or severe arrythmia.  There are no medical contraindications to surgery, and I recommend proceeding as planned.    The patient may continue all of his/her medications, except: hold ASA,NSAIDs 1 week prior to surgery.    Thank you for the opportunity to assist in the care of this patient.  Please do not hesitate to contact me with any questions or concerns    Letitia Libra, MD  Internist  [facility] [place]  [address] [place], [place]  [address]  T [phone]  F  [phone]

## 2013-08-10 LAB — CBC AND DIFFERENTIAL
Atypical Lymphocytes %: 0 %
Baso(Absolute): 10 cells/uL (ref 0–200)
Basophils: 0.2 %
Eosinophils Absolute: 88 cells/uL (ref 15–500)
Eosinophils: 1.8 %
Hematocrit: 46.9 % (ref 38.5–50.0)
Hemoglobin: 15.7 g/dL (ref 13.2–17.1)
Lymphocytes Absolute: 956 cells/uL (ref 850–3900)
Lymphocytes: 19.5 %
MCH: 31.6 pg (ref 27–33)
MCHC: 33.5 g/dL (ref 32–36)
MCV: 95 fL (ref 80–100)
MPV: 9 fL (ref 7.5–11.5)
Monocytes Absolute: 294 cells/uL (ref 200–950)
Monocytes: 6 %
Neutrophils Absolute: 3553 cells/uL (ref 1500–7800)
Neutrophils: 72.5 %
Platelets: 223 10*3/uL (ref 140–400)
RBC: 4.96 10*6/uL (ref 4.20–5.80)
RDW: 14.1 % (ref 11.0–15.0)
WBC: 4.9 10*3/uL (ref 3.8–10.8)

## 2013-08-10 LAB — COMPREHENSIVE METABOLIC PANEL
ALT: 22 U/L (ref 9–46)
AST (SGOT): 25 U/L (ref 10–35)
Albumin/Globulin Ratio: 1.7 (ref 1.0–2.5)
Albumin: 4.5 G/DL (ref 3.6–5.1)
Alkaline Phosphatase: 47 U/L (ref 40–115)
BUN: 12 MG/DL (ref 7–25)
Bilirubin, Total: 1.9 MG/DL — ABNORMAL HIGH (ref 0.2–1.2)
CO2: 27 mmol/L (ref 19–30)
Calcium: 9.7 MG/DL (ref 8.6–10.3)
Chloride: 104 mmol/L (ref 98–110)
Creatinine: 0.89 mg/dL (ref 0.70–1.25)
EGFR African American: 102 mL/min/{1.73_m2} (ref >=60)
EGFR: 88 mL/min/{1.73_m2} (ref >=60)
Globulin: 2.6 G/DL (ref 1.9–3.7)
Glucose: 106 MG/DL — ABNORMAL HIGH (ref 65–99)
Potassium: 4.5 mmol/L (ref 3.5–5.3)
Protein, Total: 7.1 G/DL (ref 6.1–8.1)
Sodium: 141 mmol/L (ref 135–146)

## 2013-08-20 HISTORY — PX: RELEASE, DUPUYTREN'S CONTRACTURE: SHX5076

## 2013-09-11 ENCOUNTER — Encounter: Payer: Self-pay | Admitting: Gastroenterology

## 2013-11-06 ENCOUNTER — Other Ambulatory Visit: Payer: Self-pay | Admitting: Internal Medicine

## 2013-11-06 MED ORDER — LUNESTA 2 MG PO TABS
2.0000 mg | ORAL_TABLET | Freq: Every evening | ORAL | Status: DC
Start: 2013-11-06 — End: 2014-04-26

## 2013-11-06 NOTE — Telephone Encounter (Signed)
Did you get this ?    Frederick [name]. [name], M.D.

## 2013-11-06 NOTE — Telephone Encounter (Signed)
Patient needs refills on LUNESTA 2 MG tablet only has 7 days left. Wants it to be send to [facility]. Needs this ASAP. Patient # [phone]

## 2013-11-06 NOTE — Telephone Encounter (Signed)
Ok to do this      TY      Fred Hammes J. Larenzo Caples, M.D.

## 2013-11-07 ENCOUNTER — Other Ambulatory Visit: Payer: Self-pay | Admitting: Internal Medicine

## 2013-11-07 MED ORDER — ATORVASTATIN CALCIUM 10 MG PO TABS
10.0000 mg | ORAL_TABLET | Freq: Every day | ORAL | Status: DC
Start: 2013-11-07 — End: 2014-11-23

## 2013-11-09 ENCOUNTER — Telehealth: Payer: Self-pay | Admitting: Internal Medicine

## 2013-11-09 NOTE — Telephone Encounter (Signed)
Informed patient refill lunesta [facility] received faxed order on 11/06/13 will send it out to patient home of choice on4/17/15. Patient stated, will contact pharmacy to send over night express with confirmation # [id number].

## 2014-01-24 ENCOUNTER — Encounter: Payer: Self-pay | Admitting: Gastroenterology

## 2014-03-14 HISTORY — PX: NASAL SINUS SURGERY: SHX719

## 2014-03-20 ENCOUNTER — Ambulatory Visit (AMBULATORY_SURGERY_CENTER): Payer: Self-pay

## 2014-03-20 VITALS — Ht 69.0 in | Wt 175.0 lb

## 2014-03-20 DIAGNOSIS — Z1211 Encounter for screening for malignant neoplasm of colon: Secondary | ICD-10-CM

## 2014-03-20 MED ORDER — SUPREP BOWEL PREP KIT 17.5-3.13-1.6 GM/177ML PO SOLN: 1.0000 | Freq: Once | ORAL | Status: DC

## 2014-03-20 NOTE — Progress Notes (Signed)
No allergies to eggs or soy No diett/weight loss meds No home oxygen No past problem with anesthesia  Has email  Emmi instructions given for colonoscopy

## 2014-04-03 ENCOUNTER — Encounter: Payer: Self-pay | Admitting: Gastroenterology

## 2014-04-03 ENCOUNTER — Ambulatory Visit (AMBULATORY_SURGERY_CENTER): Payer: Medicare Other | Admitting: Gastroenterology

## 2014-04-03 VITALS — BP 124/69 | HR 66 | Temp 98.1°F | Resp 22 | Ht 69.0 in | Wt 175.0 lb

## 2014-04-03 DIAGNOSIS — K5730 Diverticulosis of large intestine without perforation or abscess without bleeding: Secondary | ICD-10-CM

## 2014-04-03 DIAGNOSIS — Z1211 Encounter for screening for malignant neoplasm of colon: Secondary | ICD-10-CM

## 2014-04-03 MED ORDER — SODIUM CHLORIDE 0.9 % IV SOLN: 500.0000 mL | INTRAVENOUS | Status: DC

## 2014-04-03 NOTE — Patient Instructions (Signed)
Repeat colonoscopy in 10 years. Try to follow high fiber diet, see handout given. Resume current medications. Thank you!  YOU HAD AN ENDOSCOPIC PROCEDURE TODAY AT THE [facility]: Refer to the procedure report that was given to you for any specific questions about what was found during the examination.  If the procedure report does not answer your questions, please call your gastroenterologist to clarify.  If you requested that your care partner not be given the details of your procedure findings, then the procedure report has been included in a sealed envelope for you to review at your convenience later.  YOU SHOULD EXPECT: Some feelings of bloating in the abdomen. Passage of more gas than usual.  Walking can help get rid of the air that was put into your GI tract during the procedure and reduce the bloating. If you had a lower endoscopy (such as a colonoscopy or flexible sigmoidoscopy) you may notice spotting of blood in your stool or on the toilet paper. If you underwent a bowel prep for your procedure, then you may not have a normal bowel movement for a few days.  DIET: Your first meal following the procedure should be a light meal and then it is ok to progress to your normal diet.  A half-sandwich or bowl of soup is an example of a good first meal.  Heavy or fried foods are harder to digest and may make you feel nauseous or bloated.  Likewise meals heavy in dairy and vegetables can cause extra gas to form and this can also increase the bloating.  Drink plenty of fluids but you should avoid alcoholic beverages for 24 hours.  ACTIVITY: Your care partner should take you home directly after the procedure.  You should plan to take it easy, moving slowly for the rest of the day.  You can resume normal activity the day after the procedure however you should NOT DRIVE or use heavy machinery for 24 hours (because of the sedation medicines used during the test).    SYMPTOMS TO REPORT  IMMEDIATELY: A gastroenterologist can be reached at any hour.  During normal business hours, 8:30 AM to 5:00 PM Monday through Friday, call [phone].  After hours and on weekends, please call the GI answering service at [phone] who will take a message and have the physician on call contact you.   Following lower endoscopy (colonoscopy or flexible sigmoidoscopy):  Excessive amounts of blood in the stool  Significant tenderness or worsening of abdominal pains  Swelling of the abdomen that is new, acute  Fever of 100F or higher  Following upper endoscopy (EGD)  Vomiting of blood or coffee ground material  New chest pain or pain under the shoulder blades  Painful or persistently difficult swallowing  New shortness of breath  Fever of 100F or higher  Black, tarry-looking stools  FOLLOW UP: If any biopsies were taken you will be contacted by phone or by letter within the next 1-3 weeks.  Call your gastroenterologist if you have not heard about the biopsies in 3 weeks.  Our staff will call the home number listed on your records the next business day following your procedure to check on you and address any questions or concerns that you may have at that time regarding the information given to you following your procedure. This is a courtesy call and so if there is no answer at the home number and we have not heard from you through the emergency physician on call,  we will assume that you have returned to your regular daily activities without incident.  SIGNATURES/CONFIDENTIALITY: You and/or your care partner have signed paperwork which will be entered into your electronic medical record.  These signatures attest to the fact that that the information above on your After Visit Summary has been reviewed and is understood.  Full responsibility of the confidentiality of this discharge information lies with you and/or your care-partner.  Diverticulosis Diverticulosis is the condition that  develops when small pouches (diverticula) form in the wall of your colon. Your colon, or large intestine, is where water is absorbed and stool is formed. The pouches form when the inside layer of your colon pushes through weak spots in the outer layers of your colon. CAUSES  No one knows exactly what causes diverticulosis. RISK FACTORS  Being older than 50. Your risk for this condition increases with age. Diverticulosis is rare in people younger than 40 years. By age 58, almost everyone has it.  Eating a low-fiber diet.  Being frequently constipated.  Being overweight.  Not getting enough exercise.  Smoking.  Taking over-the-counter pain medicines, like aspirin and ibuprofen. SYMPTOMS  Most people with diverticulosis do not have symptoms. DIAGNOSIS  Because diverticulosis often has no symptoms, health care providers often discover the condition during an exam for other colon problems. In many cases, a health care provider will diagnose diverticulosis while using a flexible scope to examine the colon (colonoscopy). TREATMENT  If you have never developed an infection related to diverticulosis, you may not need treatment. If you have had an infection before, treatment may include:  Eating more fruits, vegetables, and grains.  Taking a fiber supplement.  Taking a live bacteria supplement (probiotic).  Taking medicine to relax your colon. HOME CARE INSTRUCTIONS   Drink at least 6-8 glasses of water each day to prevent constipation.  Try not to strain when you have a bowel movement.  Keep all follow-up appointments. If you have had an infection before:  Increase the fiber in your diet as directed by your health care provider or dietitian.  Take a dietary fiber supplement if your health care provider approves.  Only take medicines as directed by your health care provider. SEEK MEDICAL CARE IF:   You have abdominal pain.  You have bloating.  You have cramps.  You have  not gone to the bathroom in 3 days. SEEK IMMEDIATE MEDICAL CARE IF:   Your pain gets worse.  Yourbloating becomes very bad.  You have a fever or chills, and your symptoms suddenly get worse.  You begin vomiting.  You have bowel movements that are bloody or black. MAKE SURE YOU:  Understand these instructions.  Will watch your condition.  Will get help right away if you are not doing well or get worse. Document Released: 04/08/2004 Document Revised: 07/17/2013 Document Reviewed: 06/06/2013 [facility] Patient Information 2015 [place], [place]. This information is not intended to replace advice given to you by your health care provider. Make sure you discuss any questions you have with your health care provider.

## 2014-04-03 NOTE — Progress Notes (Signed)
Patient denies any allergies to eggs or soy. Patient denies any problems with anesthesia/sedation. Patient denies any oxygen use at home and does not take any diet/weight loss medications.  

## 2014-04-03 NOTE — Op Note (Signed)
[facility] [address] [place], [address]   COLONOSCOPY PROCEDURE REPORT  PATIENT: Austin, Carter  MR#: [id number] BIRTHDATE: 11/15/1945 , 67  yrs. old GENDER: Male ENDOSCOPIST: Louis Meckel, MD REFERRED AO:ZHYQM Chilton Si, M.D. PROCEDURE DATE:  04/03/2014 PROCEDURE:   Colonoscopy, diagnostic First Screening Colonoscopy - Avg.  risk and is 50 yrs.  old or older - No.  Prior Negative Screening - Now for repeat screening. 10 or more years since last screening  History of Adenoma - Now for follow-up colonoscopy & has been > or = to 3 yrs.  N/A  Polyps Removed Today? No.  Recommend repeat exam, <10 yrs? No. ASA CLASS:   Class II INDICATIONS:Average risk patient for colon cancer. MEDICATIONS: MAC sedation, administered by CRNA and Propofol (Diprivan) 240 mg IV  DESCRIPTION OF PROCEDURE:   After the risks benefits and alternatives of the procedure were thoroughly explained, informed consent was obtained.  A digital rectal exam revealed no abnormalities of the rectum.   The LB [id number]  endoscope was introduced through the anus and advanced to the cecum, which was identified by both the appendix and ileocecal valve. No adverse events experienced.   The quality of the prep was excellent using Suprep  The instrument was then slowly withdrawn as the colon was fully examined.      COLON FINDINGS: There was severe diverticulosis noted in the sigmoid colon with associated muscular hypertrophy and luminal narrowing. The colon was otherwise normal.  There was no diverticulosis, inflammation, polyps or cancers unless previously stated. Retroflexed views revealed no abnormalities. The time to cecum=9 minutes 42 seconds.  Withdrawal time=6 minutes 12 seconds.  The scope was withdrawn and the procedure completed. COMPLICATIONS: There were no complications.  ENDOSCOPIC IMPRESSION: 1.   There was severe diverticulosis noted in the sigmoid colon 2.   The  colon was otherwise normal  RECOMMENDATIONS: Continue current colorectal screening recommendations for "routine risk" patients with a repeat colonoscopy in 10 years.   eSigned:  Louis Meckel, MD 04/03/2014 12:01 PM   cc:   PATIENT NAME:  Austin, Carter MR#: [id number]

## 2014-04-03 NOTE — Progress Notes (Signed)
Report to PACU, RN, vss, BBS= Clear.  

## 2014-04-04 ENCOUNTER — Telehealth: Payer: Self-pay | Admitting: *Deleted

## 2014-04-04 NOTE — Telephone Encounter (Signed)
  Follow up Call-  Call back number 04/03/2014  Post procedure Call Back phone  # [phone]  Permission to leave phone message Yes     Patient questions:  Do you have a fever, pain , or abdominal swelling? No. Pain Score  0 *  Have you tolerated food without any problems? Yes.    Have you been able to return to your normal activities? Yes.    Do you have any questions about your discharge instructions: Diet   No. Medications  No. Follow up visit  No.  Do you have questions or concerns about your Care? No.  Actions: * If pain score is 4 or above: No action needed, pain <4.

## 2014-04-18 ENCOUNTER — Ambulatory Visit: Payer: Medicare Other | Admitting: Internal Medicine

## 2014-04-18 ENCOUNTER — Encounter: Payer: Self-pay | Admitting: Internal Medicine

## 2014-04-18 ENCOUNTER — Other Ambulatory Visit (FREE_STANDING_LABORATORY_FACILITY): Payer: Medicare Other

## 2014-04-18 VITALS — BP 140/88 | HR 75 | Temp 97.8°F | Ht 74.0 in | Wt 182.0 lb

## 2014-04-18 DIAGNOSIS — Z0000 Encounter for general adult medical examination without abnormal findings: Secondary | ICD-10-CM

## 2014-04-18 DIAGNOSIS — Z23 Encounter for immunization: Secondary | ICD-10-CM

## 2014-04-18 DIAGNOSIS — Z125 Encounter for screening for malignant neoplasm of prostate: Secondary | ICD-10-CM

## 2014-04-18 DIAGNOSIS — I10 Essential (primary) hypertension: Secondary | ICD-10-CM

## 2014-04-18 LAB — COMPREHENSIVE METABOLIC PANEL
ALT: 29 U/L (ref 0–55)
AST (SGOT): 26 U/L (ref 5–34)
Albumin/Globulin Ratio: 1.4 (ref 0.9–2.2)
Albumin: 4.4 g/dL (ref 3.5–5.0)
Alkaline Phosphatase: 53 U/L (ref 38–106)
BUN: 15 mg/dL (ref 9.0–28.0)
Bilirubin, Total: 2.1 mg/dL — ABNORMAL HIGH (ref 0.1–1.2)
CO2: 27 mEq/L (ref 21–30)
Calcium: 9.6 mg/dL (ref 8.5–10.5)
Chloride: 106 mEq/L (ref 100–111)
Creatinine: 0.9 mg/dL (ref 0.5–1.5)
Globulin: 3.1 g/dL (ref 2.0–3.7)
Glucose: 94 mg/dL (ref 70–100)
Potassium: 4.3 mEq/L (ref 3.5–5.3)
Protein, Total: 7.5 g/dL (ref 6.0–8.3)
Sodium: 143 mEq/L (ref 135–146)

## 2014-04-18 LAB — URINE MICROSCOPIC

## 2014-04-18 LAB — LIPID PANEL
Cholesterol / HDL Ratio: 3
Cholesterol: 166 mg/dL (ref 0–199)
HDL: 55 mg/dL (ref >40)
LDL Calculated: 96 mg/dL (ref 0–99)
Triglycerides: 75 mg/dL (ref 34–149)
VLDL Calculated: 15 mg/dL (ref 10–40)

## 2014-04-18 LAB — CBC
Hematocrit: 50.6 % (ref 42.0–52.0)
Hgb: 16.3 g/dL (ref 13.0–17.0)
MCH: 31.5 pg (ref 28.0–32.0)
MCHC: 32.2 g/dL (ref 32.0–36.0)
MCV: 97.7 fL (ref 80.0–100.0)
MPV: 10.8 fL (ref 9.4–12.3)
Nucleated RBC: 0 /100 WBC (ref 0–1)
Platelets: 220 10*3/uL (ref 140–400)
RBC: 5.18 10*6/uL (ref 4.70–6.00)
RDW: 13 % (ref 12–15)
WBC: 5.33 10*3/uL (ref 3.50–10.80)

## 2014-04-18 LAB — URINALYSIS
Bilirubin, UA: NEGATIVE
Blood, UA: NEGATIVE
Glucose, UA: NEGATIVE
Ketones UA: 15 — AB
Leukocyte Esterase, UA: NEGATIVE
Nitrite, UA: NEGATIVE
Protein, UR: 30 — AB
Specific Gravity UA: 1.018 (ref 1.001–1.035)
Urine pH: 6 (ref 5.0–8.0)
Urobilinogen, UA: 0.2 (ref 0.2–2.0)

## 2014-04-18 LAB — GFR: EGFR: >60

## 2014-04-18 LAB — HEMOLYSIS INDEX: Hemolysis Index: 11 (ref 0–18)

## 2014-04-18 LAB — PSA: Prostate Specific Antigen, Total: 1.159 ng/mL (ref 0.000–4.000)

## 2014-04-18 NOTE — Progress Notes (Signed)
nSUBJECTIVE:  Frederick [name] is a 68 y.o. male, who presents today for a Medicare Annual Wellness visit.  Hx of Dupetrens with surgery  Hx of Tinnitus and hearing loss.   He uses lunesta for sleep. Hyperacusis.   He has significant white caot hypertension. He reports BP pf 12- tp 140 at home.   Past Medical History   Diagnosis Date   . Diverticulitis    . Tinnitus    . Insomnia    . Hyperlipidemia      Past Surgical History   Procedure Laterality Date   . Appendectomy  1973   . Rotator cuff repair     . Release, dupuytren's contracture       right hand     History reviewed. No pertinent family history.  History     Social History   . Marital Status: Divorced     Spouse Name: N/A     Number of Children: N/A   . Years of Education: N/A     Occupational History   . Not on file.     Social History Main Topics   . Smoking status: Never Smoker    . Smokeless tobacco: Never Used   . Alcohol Use: Yes   . Drug Use: Not on file   . Sexual Activity: Not on file     Other Topics Concern   . Not on file     Social History Narrative       Current Outpatient Prescriptions on File Prior to Visit   Medication Sig Dispense Refill   . aspirin EC 81 MG EC tablet Take 81 mg by mouth daily.     Marland Kitchen atorvastatin (LIPITOR) 10 MG tablet Take 1 tablet (10 mg total) by mouth daily. 90 tablet 3   . LUNESTA 2 MG tablet Take 1 tablet (2 mg total) by mouth nightly. Take immediately before bedtime 90 tablet 1   . metoprolol XL (TOPROL XL) 25 MG 24 hr tablet Take 1 tablet (25 mg total) by mouth daily. 90 tablet 1     No current facility-administered medications on file prior to visit.       Current Physicians:    Patient Care Team:  Cassell Clement, MD as PCP - General (Internal Medicine)    Depression risk factors: (Please complete the PHQ-9 Screening Flowsheet)    Depression Screening Summary:     PHQ-2 Questions  Question 1 - Little interest: Not at All  Question 2 - Feeling down:  Not at All  PHQ-2 Total Score:  0     PHQ9 Questions       Question 2 - Feeling down:     Question 3 - Trouble sleeping:      Question 4 - Feeling tired:     Question 5 - Poor appetite:     Question 6 - Feeling bad:     Question 7 - Trouble concentration:      Question 8 - Moving slowly:     Question 9 - Thought of hurting:      Total Score:     Question 10 - Difficulty functioning:      Question 11 - Threatening/attempting suicide:       Interpretation of Total Score   Total Score Depression Severity   1-4 Minimal depression   5-9 Mild depression   10-14 Moderate depression   15-19 Moderately severe depression   20-27 Severe depression     Fall Risk Screening:  Do  you have trouble keeping your balance:   n  Any falls in past year:   n  Have you fallen two or more times or one time that resulted in an injury in the last year:   n  Any falls with and injury:   nn  Time Up and Go (TUG) Test .12 seconds:   n  Chair Stand Test - Below Average Score:   n  4-Stage Balance Test Full tandem stance <10 seconds:   n  Lightheadedness or dizziness from lying to standing:   n    Functional Ability:  Do you have trouble dressing, bathing, eating, using the toilet or grooming:            Do you have trouble making food, doing housework, using phone or transportation:     Do you have trouble using your checkbook, paying bills or taking medicine:     Do you have serious difficulty walking or climbing stairs:       Are you deaf or do you have serious trouble hearing:       Are you legally blind or do you have serious trouble seeing even if you wear glasses:      Do you use seat belt when riding in a vehicle:                                 Cognitive Function:  Impaired cognitive function is not felt to be present.    OBJECTIVE:  BP 140/88 mmHg  Pulse 75  Temp(Src) 97.8 F (36.6 C) (Oral)  Ht 1.88 m (6\' 2" )  Wt 82.555 kg (182 lb)  BMI 23.36 kg/m2  Body mass index is 23.36 kg/(m^2).     Physical Exam:  BP 140/88 mmHg  Pulse 75  Temp(Src) 97.8 F (36.6 C) (Oral)  Ht 1.88 m (6\' 2" )   Wt 82.555 kg (182 lb)  BMI 23.36 kg/m2  Wt Readings from Last 3 Encounters:   04/18/14 82.555 kg (182 lb)   08/09/13 87.091 kg (192 lb)   04/16/13 85.73 kg (189 lb)     GEN: WD WNalert and appropriate in NAD  HENT: NCAT, TMs normal bilaterally, no nasal congestion, OP normal w no exudate or erythema  EYES: PERRL, EOMI, no pallor or scleral icterus, normal conjunctiva  NECK: supple, no thyromegaly or nodules appreciated  LYMPH: no cervical or supraclavicular LAD appreciated  CV: RRR, no m/r/g.  No LE edema.  2+ radial pulses present and equal.  RESP: CTAB, normal effort, no rhonchi or rales  GI: soft, nontender/ nondistended, NABS, no rebound or guarding  SKIN: warm, dry.  no visible rashes or concerning moles  MSK: Normal bulk and tone, normal strength 5/5 and sensation UE / LE  NEURO: No CN deficits noted (II-XII) PERRLA, EOMI, face symmetric, hearing intact to voice, palate raise, shoulder shrug and neck flexion intact, tongue midline. MAE.  PSYCH: normal mood and affect        Patient reports eye exam from within last 12 months.    ASSESSMENT & PLAN:    We went over appropriate screening and preventative services and referrals were made as indicated.  See patient instructions for written assessment and plan.    Recommended Preventative Services:    Cardiovascular Disease Screening Tests  Diabetes Screening  Prostate Cancer Screening  Monitor BP and return in 3 to 4 manths.    Carlota Raspberry, M.D.

## 2014-04-18 NOTE — Addendum Note (Signed)
Addended by: Domingo Cocking on: 04/18/2014 08:23 PM     Modules accepted: Orders

## 2014-04-26 ENCOUNTER — Other Ambulatory Visit: Payer: Self-pay | Admitting: Internal Medicine

## 2014-04-26 ENCOUNTER — Other Ambulatory Visit: Payer: Self-pay

## 2014-04-26 DIAGNOSIS — I10 Essential (primary) hypertension: Secondary | ICD-10-CM

## 2014-04-26 MED ORDER — LUNESTA 2 MG PO TABS
2.0000 mg | ORAL_TABLET | Freq: Every evening | ORAL | Status: DC
Start: 2014-04-26 — End: 2014-05-01

## 2014-05-01 ENCOUNTER — Other Ambulatory Visit: Payer: Self-pay | Admitting: Internal Medicine

## 2014-05-01 ENCOUNTER — Telehealth: Payer: Self-pay | Admitting: Internal Medicine

## 2014-05-01 DIAGNOSIS — G479 Sleep disorder, unspecified: Secondary | ICD-10-CM

## 2014-05-01 MED ORDER — LUNESTA 2 MG PO TABS
2.0000 mg | ORAL_TABLET | Freq: Every evening | ORAL | Status: DC
Start: 2014-05-01 — End: 2014-08-01

## 2014-05-01 NOTE — Telephone Encounter (Signed)
Called to inform patient [facility]ill [facility]

## 2014-07-26 DIAGNOSIS — S060X9A Concussion with loss of consciousness of unspecified duration, initial encounter: Secondary | ICD-10-CM

## 2014-07-26 DIAGNOSIS — S060XAA Concussion with loss of consciousness status unknown, initial encounter: Secondary | ICD-10-CM

## 2014-07-26 HISTORY — DX: Concussion with loss of consciousness status unknown, initial encounter: S06.0XAA

## 2014-07-26 HISTORY — DX: Concussion with loss of consciousness of unspecified duration, initial encounter: S06.0X9A

## 2014-08-01 ENCOUNTER — Other Ambulatory Visit: Payer: Self-pay | Admitting: Internal Medicine

## 2014-08-01 DIAGNOSIS — G479 Sleep disorder, unspecified: Secondary | ICD-10-CM

## 2014-08-01 MED ORDER — LUNESTA 2 MG PO TABS
2.0000 mg | ORAL_TABLET | Freq: Every evening | ORAL | Status: DC
Start: 2014-08-01 — End: 2014-08-09

## 2014-08-01 NOTE — Telephone Encounter (Signed)
lunesta was faxed to pharmacy

## 2014-08-05 ENCOUNTER — Telehealth (INDEPENDENT_AMBULATORY_CARE_PROVIDER_SITE_OTHER): Payer: Self-pay | Admitting: Internal Medicine

## 2014-08-05 NOTE — Telephone Encounter (Signed)
Patient called that [facility] only gave him 30tablets, need a verbal approval to get the remaining 60tablets to equal 90 day supply. [facility] # is [phone]. Patient would like a call back # on [phone]

## 2014-08-09 ENCOUNTER — Other Ambulatory Visit: Payer: Self-pay | Admitting: Internal Medicine

## 2014-08-09 DIAGNOSIS — G479 Sleep disorder, unspecified: Secondary | ICD-10-CM

## 2014-08-10 MED ORDER — LUNESTA 2 MG PO TABS
2.0000 mg | ORAL_TABLET | Freq: Every evening | ORAL | Status: DC
Start: 2014-08-10 — End: 2014-08-27

## 2014-08-27 ENCOUNTER — Encounter (INDEPENDENT_AMBULATORY_CARE_PROVIDER_SITE_OTHER): Payer: Self-pay | Admitting: Internal Medicine

## 2014-08-27 ENCOUNTER — Ambulatory Visit (INDEPENDENT_AMBULATORY_CARE_PROVIDER_SITE_OTHER): Payer: Medicare Other | Admitting: Internal Medicine

## 2014-08-27 VITALS — BP 160/88 | HR 65 | Temp 97.2°F | Ht 74.0 in

## 2014-08-27 DIAGNOSIS — G479 Sleep disorder, unspecified: Secondary | ICD-10-CM

## 2014-08-27 DIAGNOSIS — I10 Essential (primary) hypertension: Secondary | ICD-10-CM

## 2014-08-27 MED ORDER — LOSARTAN POTASSIUM 50 MG PO TABS
50.0000 mg | ORAL_TABLET | Freq: Every day | ORAL | Status: DC
Start: 2014-08-27 — End: 2015-08-20

## 2014-08-27 MED ORDER — ESZOPICLONE 2 MG PO TABS
2.0000 mg | ORAL_TABLET | Freq: Every evening | ORAL | Status: DC
Start: 2014-08-27 — End: 2014-08-27

## 2014-08-27 MED ORDER — ESZOPICLONE 2 MG PO TABS
2.0000 mg | ORAL_TABLET | Freq: Every evening | ORAL | Status: DC
Start: 2014-08-27 — End: 2014-11-23

## 2014-08-27 NOTE — Progress Notes (Signed)
Chief Complaint   Patient presents with   . Blood Pressure Check   . Medication Refill       HPI    Frederick [name] is a 69 y.o. male here for evaluation and treatment of the following concerns. The pt has hypertension and is accepting the need for treatment. He has been trying to keep his bp down woth lifestyle measures.     He has had trouble getting genric lunesta and is concerned he wooo not sleep.     Active Problems  Patient Active Problem List    Diagnosis Date Noted   . Dupuytren's contracture of left hand 08/09/2013   . Preop examination 08/09/2013   . Labile blood pressure 02/22/2013   . Dupuytren's contracture of both hands 02/22/2013   . Hyperacusis of both ears 02/22/2013   . Hyperlipidemia 01/18/2013   . Insomnia 01/18/2013   . Tinnitus 01/18/2013       The patient's past medical history, surgical history, medications and allergies were reviewed.    No Known Allergies    Medications  Current Outpatient Prescriptions   Medication Sig Dispense Refill   . aspirin EC 81 MG EC tablet Take 81 mg by mouth daily.     Marland Kitchen atorvastatin (LIPITOR) 10 MG tablet Take 1 tablet (10 mg total) by mouth daily. 90 tablet 3   . eszopiclone (LUNESTA) 2 MG tablet Take 1 tablet (2 mg total) by mouth nightly. Take immediately before bedtime 30 tablet 1   . losartan (COZAAR) 50 MG tablet Take 1 tablet (50 mg total) by mouth daily. 30 tablet 11   . metoprolol XL (TOPROL XL) 25 MG 24 hr tablet Take 1 tablet (25 mg total) by mouth daily. 90 tablet 1     No current facility-administered medications for this visit.       Review of Systems  CONST: No weight change, no fevers/chills/sweats, fatigue, muscle aches, appetite loss  EYES: No blurry vision, double vision, loss of peripheral vision  EARS: No hearing loss, tinnitus, pain or discharge.   NOSE: No congestion, runny nose or bloody nose  MOUTH: No sore throat, bleeding gums, difficulty swallowing  NECK: No swollen glands, stiffness or pain  CV: No CP, palpitations, leg swelling,  changes in exercise tolerance, PND, orthopnea  RESP: No SOB, cough, wheeze, asthma  GI: No n/v, diarrhea, constipation, abd pain, heartburn, blood in stool, early satiety  MALE GU: No dysuria, frequency, nocturia, testicular changes, penile discharge, erectile dysfunction  MSK:  No joint or muscle pain, swelling or weakness  SKIN:   No rashes lumps, sores, concerning moles, or changes in hair or nails.  ENDO:  No heat/cold intolerance, polyuria, polydipsia, polyphagia  HEME:  No bruising/bleeding, swollen glands  NEURO:  No HA, LH, dizziness, LOC, numbness/tingling, weakness  PSYCH:  No depressed mood, anhedonia, anxiety    Physical Exam  BP 160/88 mmHg  Pulse 65  Temp(Src) 97.2 F (36.2 C) (Tympanic)  Ht 1.88 m (6\' 2" )  Wt Readings from Last 3 Encounters:   04/18/14 82.555 kg (182 lb)   08/09/13 87.091 kg (192 lb)   04/16/13 85.73 kg (189 lb)     GEN: WD WN male alert and appropriate in NAD  HENT: NCAT, TMs nl bilat, no nasal congestion, OP normal w no exudate or erythema  EYES: PERRL, EOMI, no pallor or scleral icterus, normal conjunctiva  NECK: supple, no thyromegaly or nodules appreciated  LYMPH: no cervical or supraclavicular LAD appreciated  CV: RRR,  no m/r/g.  No LE edema.  2+ radial pulses present and equal.  RESP: CTAB, normal effort, no rhonchi or rales  GI: soft, nontender/ nondistended, NABS, no rebound or guarding  SKIN: warm, dry.  no visible rashes or concerning moles  MSK: Normal bulk and tone, normal strength 5/5 and sensation UE / LE  NEURO: PERRLA, EOMI, face symmetric, hearing intact to voice, palate raise, shoulder shrug and neck flexion intact, tongue midline. MAE.  PSYCH: normal mood and affect    Assessment/Plan    1. Essential hypertension  Start medication risks discussed  - losartan (COZAAR) 50 MG tablet; Take 1 tablet (50 mg total) by mouth daily.  Dispense: 30 tablet; Refill: 11    2. Sleep disturbance  Will expidite meds and give a short term supply  - eszopiclone (LUNESTA) 2 MG  tablet; Take 1 tablet (2 mg total) by mouth nightly. Take immediately before bedtime  Dispense: 30 tablet; Refill: 1            Carlota Raspberry, M.D.

## 2014-11-08 ENCOUNTER — Encounter (INDEPENDENT_AMBULATORY_CARE_PROVIDER_SITE_OTHER): Payer: Self-pay | Admitting: Internal Medicine

## 2014-11-23 ENCOUNTER — Other Ambulatory Visit (INDEPENDENT_AMBULATORY_CARE_PROVIDER_SITE_OTHER): Payer: Self-pay | Admitting: Internal Medicine

## 2014-11-23 DIAGNOSIS — E785 Hyperlipidemia, unspecified: Secondary | ICD-10-CM

## 2014-11-23 DIAGNOSIS — G479 Sleep disorder, unspecified: Secondary | ICD-10-CM

## 2014-11-24 MED ORDER — ESZOPICLONE 2 MG PO TABS
2.0000 mg | ORAL_TABLET | Freq: Every evening | ORAL | Status: DC
Start: 2014-11-24 — End: 2015-05-14

## 2014-11-24 MED ORDER — ATORVASTATIN CALCIUM 10 MG PO TABS
10.0000 mg | ORAL_TABLET | Freq: Every day | ORAL | Status: DC
Start: 2014-11-24 — End: 2015-11-21

## 2015-02-06 ENCOUNTER — Emergency Department (HOSPITAL_COMMUNITY)
Admission: EM | Admit: 2015-02-06 | Discharge: 2015-02-06 | Disposition: A | Discharge status: Home / Self Care | Payer: No Typology Code available for payment source | Attending: Emergency Medicine | Admitting: Emergency Medicine

## 2015-02-06 ENCOUNTER — Encounter (HOSPITAL_COMMUNITY): Payer: Self-pay | Admitting: Emergency Medicine

## 2015-02-06 ENCOUNTER — Emergency Department (HOSPITAL_COMMUNITY): Payer: No Typology Code available for payment source

## 2015-02-06 DIAGNOSIS — Z79899 Other long term (current) drug therapy: Secondary | ICD-10-CM | POA: Diagnosis not present

## 2015-02-06 DIAGNOSIS — S0990XA Unspecified injury of head, initial encounter: Secondary | ICD-10-CM | POA: Insufficient documentation

## 2015-02-06 DIAGNOSIS — E039 Hypothyroidism, unspecified: Secondary | ICD-10-CM | POA: Diagnosis not present

## 2015-02-06 DIAGNOSIS — S2232XA Fracture of one rib, left side, initial encounter for closed fracture: Secondary | ICD-10-CM

## 2015-02-06 DIAGNOSIS — H540 Blindness, both eyes: Secondary | ICD-10-CM | POA: Insufficient documentation

## 2015-02-06 DIAGNOSIS — I10 Essential (primary) hypertension: Secondary | ICD-10-CM | POA: Insufficient documentation

## 2015-02-06 DIAGNOSIS — S20212A Contusion of left front wall of thorax, initial encounter: Secondary | ICD-10-CM | POA: Insufficient documentation

## 2015-02-06 DIAGNOSIS — S2242XA Multiple fractures of ribs, left side, initial encounter for closed fracture: Secondary | ICD-10-CM | POA: Diagnosis not present

## 2015-02-06 DIAGNOSIS — Y9389 Activity, other specified: Secondary | ICD-10-CM | POA: Insufficient documentation

## 2015-02-06 DIAGNOSIS — Y998 Other external cause status: Secondary | ICD-10-CM | POA: Insufficient documentation

## 2015-02-06 DIAGNOSIS — S299XXA Unspecified injury of thorax, initial encounter: Secondary | ICD-10-CM | POA: Diagnosis present

## 2015-02-06 DIAGNOSIS — Y92410 Unspecified street and highway as the place of occurrence of the external cause: Secondary | ICD-10-CM | POA: Diagnosis not present

## 2015-02-06 DIAGNOSIS — S199XXA Unspecified injury of neck, initial encounter: Secondary | ICD-10-CM | POA: Insufficient documentation

## 2015-02-06 MED ORDER — HYDROCODONE-ACETAMINOPHEN 5-325 MG PO TABS: 2.0000 | ORAL_TABLET | ORAL | Status: DC | PRN

## 2015-02-06 MED ORDER — ACETAMINOPHEN 500 MG PO TABS
1000.0000 mg | ORAL_TABLET | Freq: Once | ORAL | Status: AC
  Administered 2015-02-06: 1000 mg via ORAL
  Filled 2015-02-06: qty 2

## 2015-02-06 MED ORDER — NAPROXEN 500 MG PO TABS: 500.0000 mg | ORAL_TABLET | Freq: Two times a day (BID) | ORAL | Status: DC

## 2015-02-06 NOTE — Discharge Instructions (Signed)
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
## 2015-04-09 ENCOUNTER — Ambulatory Visit
Admission: RE | Admit: 2015-04-09 | Discharge: 2015-04-09 | Disposition: A | Discharge status: Home / Self Care | Payer: Medicare Other | Source: Ambulatory Visit | Attending: Internal Medicine | Admitting: Internal Medicine

## 2015-04-09 ENCOUNTER — Other Ambulatory Visit: Payer: Self-pay | Admitting: Internal Medicine

## 2015-04-09 DIAGNOSIS — S2239XD Fracture of one rib, unspecified side, subsequent encounter for fracture with routine healing: Secondary | ICD-10-CM

## 2015-04-09 DIAGNOSIS — R52 Pain, unspecified: Secondary | ICD-10-CM

## 2015-04-30 ENCOUNTER — Other Ambulatory Visit: Payer: Self-pay | Admitting: Internal Medicine

## 2015-04-30 DIAGNOSIS — J019 Acute sinusitis, unspecified: Secondary | ICD-10-CM

## 2015-05-05 ENCOUNTER — Ambulatory Visit
Admission: RE | Admit: 2015-05-05 | Discharge: 2015-05-05 | Disposition: A | Discharge status: Home / Self Care | Payer: Medicare Other | Source: Ambulatory Visit | Attending: Internal Medicine | Admitting: Internal Medicine

## 2015-05-05 DIAGNOSIS — J019 Acute sinusitis, unspecified: Secondary | ICD-10-CM

## 2015-05-14 ENCOUNTER — Other Ambulatory Visit (INDEPENDENT_AMBULATORY_CARE_PROVIDER_SITE_OTHER): Payer: Self-pay | Admitting: Internal Medicine

## 2015-05-14 DIAGNOSIS — G479 Sleep disorder, unspecified: Secondary | ICD-10-CM

## 2015-05-15 MED ORDER — ESZOPICLONE 2 MG PO TABS
2.0000 mg | ORAL_TABLET | Freq: Every evening | ORAL | Status: DC
Start: 2015-05-15 — End: 2015-08-25

## 2015-08-20 ENCOUNTER — Other Ambulatory Visit (INDEPENDENT_AMBULATORY_CARE_PROVIDER_SITE_OTHER): Payer: Self-pay | Admitting: Internal Medicine

## 2015-08-20 DIAGNOSIS — I10 Essential (primary) hypertension: Secondary | ICD-10-CM

## 2015-08-20 MED ORDER — LOSARTAN POTASSIUM 50 MG PO TABS
50.0000 mg | ORAL_TABLET | Freq: Every day | ORAL | Status: AC
Start: 2015-08-20 — End: 2016-08-19

## 2015-08-25 ENCOUNTER — Other Ambulatory Visit (INDEPENDENT_AMBULATORY_CARE_PROVIDER_SITE_OTHER): Payer: Self-pay | Admitting: Internal Medicine

## 2015-08-25 DIAGNOSIS — G479 Sleep disorder, unspecified: Secondary | ICD-10-CM

## 2015-08-25 MED ORDER — ESZOPICLONE 2 MG PO TABS
2.0000 mg | ORAL_TABLET | Freq: Every evening | ORAL | Status: DC
Start: 2015-08-25 — End: 2015-11-21

## 2015-08-25 NOTE — Telephone Encounter (Signed)
Patient aware, need to set up a appt in order to  Continue with Therapy.

## 2015-08-27 ENCOUNTER — Encounter (INDEPENDENT_AMBULATORY_CARE_PROVIDER_SITE_OTHER): Payer: Self-pay

## 2015-08-28 ENCOUNTER — Encounter (INDEPENDENT_AMBULATORY_CARE_PROVIDER_SITE_OTHER): Payer: Self-pay | Admitting: Internal Medicine

## 2015-08-28 ENCOUNTER — Ambulatory Visit (INDEPENDENT_AMBULATORY_CARE_PROVIDER_SITE_OTHER): Payer: Medicare Other | Admitting: Internal Medicine

## 2015-08-28 VITALS — BP 167/94 | HR 73 | Temp 97.5°F | Ht 73.0 in | Wt 187.0 lb

## 2015-08-28 DIAGNOSIS — N401 Enlarged prostate with lower urinary tract symptoms: Secondary | ICD-10-CM

## 2015-08-28 DIAGNOSIS — Z23 Encounter for immunization: Secondary | ICD-10-CM

## 2015-08-28 DIAGNOSIS — E7800 Pure hypercholesterolemia, unspecified: Secondary | ICD-10-CM

## 2015-08-28 DIAGNOSIS — Z0000 Encounter for general adult medical examination without abnormal findings: Secondary | ICD-10-CM

## 2015-08-28 DIAGNOSIS — I10 Essential (primary) hypertension: Secondary | ICD-10-CM

## 2015-08-28 LAB — CBC AND DIFFERENTIAL
Basophils Absolute Automated: 0.01 10*3/uL (ref 0.00–0.20)
Basophils Automated: 0 %
Eosinophils Absolute Automated: 0.07 10*3/uL (ref 0.00–0.70)
Eosinophils Automated: 1 %
Hematocrit: 49.5 % (ref 42.0–52.0)
Hgb: 16.1 g/dL (ref 13.0–17.0)
Immature Granulocytes Absolute: 0.01 10*3/uL
Immature Granulocytes: 0 %
Lymphocytes Absolute Automated: 1.22 10*3/uL (ref 0.50–4.40)
Lymphocytes Automated: 21 %
MCH: 32.3 pg — ABNORMAL HIGH (ref 28.0–32.0)
MCHC: 32.5 g/dL (ref 32.0–36.0)
MCV: 99.2 fL (ref 80.0–100.0)
MPV: 10.8 fL (ref 9.4–12.3)
Monocytes Absolute Automated: 0.29 10*3/uL (ref 0.00–1.20)
Monocytes: 5 %
Neutrophils Absolute: 4.32 10*3/uL (ref 1.80–8.10)
Neutrophils: 73 %
Nucleated RBC: 0 /100 WBC (ref 0–1)
Platelets: 235 10*3/uL (ref 140–400)
RBC: 4.99 10*6/uL (ref 4.70–6.00)
RDW: 14 % (ref 12–15)
WBC: 5.92 10*3/uL (ref 3.50–10.80)

## 2015-08-28 LAB — COMPREHENSIVE METABOLIC PANEL
ALT: 31 U/L (ref 0–55)
AST (SGOT): 30 U/L (ref 5–34)
Albumin/Globulin Ratio: 1.5 (ref 0.9–2.2)
Albumin: 4.3 g/dL (ref 3.5–5.0)
Alkaline Phosphatase: 51 U/L (ref 38–106)
BUN: 15 mg/dL (ref 9.0–28.0)
Bilirubin, Total: 1.8 mg/dL — ABNORMAL HIGH (ref 0.1–1.2)
CO2: 31 mEq/L — ABNORMAL HIGH (ref 21–30)
Calcium: 10 mg/dL (ref 7.9–10.2)
Chloride: 106 mEq/L (ref 100–111)
Creatinine: 0.8 mg/dL (ref 0.5–1.5)
Globulin: 2.8 g/dL (ref 2.0–3.7)
Glucose: 106 mg/dL — ABNORMAL HIGH (ref 70–100)
Potassium: 4.6 mEq/L (ref 3.5–5.3)
Protein, Total: 7.1 g/dL (ref 6.0–8.3)
Sodium: 141 mEq/L (ref 135–146)

## 2015-08-28 LAB — URINALYSIS
Bilirubin, UA: NEGATIVE
Glucose, UA: NEGATIVE
Ketones UA: NEGATIVE
Leukocyte Esterase, UA: NEGATIVE
Nitrite, UA: NEGATIVE
Protein, UR: 30 — AB
Specific Gravity UA: 1.02 (ref 1.001–1.035)
Urine pH: 5.5 (ref 5.0–8.0)
Urobilinogen, UA: 0.2 (ref 0.2–2.0)

## 2015-08-28 LAB — PSA: Prostate Specific Antigen, Total: 1.059 ng/mL (ref 0.000–4.000)

## 2015-08-28 LAB — LIPID PANEL
Cholesterol / HDL Ratio: 3.1
Cholesterol: 168 mg/dL (ref 0–199)
HDL: 54 mg/dL (ref 40–9999)
LDL Calculated: 97 mg/dL (ref 0–99)
Triglycerides: 85 mg/dL (ref 34–149)
VLDL Calculated: 17 mg/dL (ref 10–40)

## 2015-08-28 LAB — GFR: EGFR: >60

## 2015-08-28 LAB — HEMOLYSIS INDEX: Hemolysis Index: 6 (ref 0–18)

## 2015-08-28 LAB — URINE MICROSCOPIC

## 2015-08-28 NOTE — Progress Notes (Signed)
Have you seen any new specialists/physicians since you were last here?  NO    Limb alert protocol reviewed?  Yes or No      YES

## 2015-08-28 NOTE — Progress Notes (Signed)
Frederick [name] is a 70 y.o. male who presents today for a Medicare Annual Wellness Visit.   Borderline BP  Rt shoulder.had an acute injury minor   Exercise 3 to 4  Health Risk Assessment     During the past month, how would you rate your general health?: Good    Which of the following tasks can you do without assistance - drive or take the bus alone; shop for groceries or clothes; prepare your own meals; do your own housework/laundry; handle your own finances/pay bills; eat, bathe or get around your home?: All    Which of the following problems have you been bothered by in the past month - dizzy when standing up; problems using the phone; feeling tired or fatigued; moderate or severe body pain?:     None  Do you exercise for about 20 minutes 3 or more days per week?: yes    During the past month was someone available to help if you needed and wanted help?  For example, if you felt nervous, lonely, got sick and had to stay in bed, needed someone to talk to, needed help with daily chores or needed help just taking care of yourself.:   Yes  Do you always wear a seat belt?:    Yes  Do you have any trouble taking medications the way you have been told to take them?:    No  Have you been given any information that can help you with keeping track of your medications?:    None needed  Do you have trouble paying for your medications?:    No  Have you been given any information that can help you with hazards in your house, such as scatter rugs, furniture, etc?:    None needed  Do you feel unsteady when standing or walking?:    No  Do you worry about falling?:    No  Have you fallen two or more times in the past year?:    No  Did you suffer any injuries from your falls in the past year?:    No    Patient Care Team:  Cassell Clement, MD as PCP - General (Internal Medicine)    Past Medical History   Diagnosis Date   . Diverticulitis    . Tinnitus    . Insomnia    . Hyperlipidemia      Past Surgical History   Procedure  Laterality Date   . Appendectomy  1973   . Rotator cuff repair     . Release, dupuytren's contracture       right hand     No Known Allergies   Current Outpatient Prescriptions   Medication Sig Dispense Refill   . aspirin EC 81 MG EC tablet Take 81 mg by mouth daily.     Marland Kitchen atorvastatin (LIPITOR) 10 MG tablet Take 1 tablet (10 mg total) by mouth daily. 90 tablet 3   . eszopiclone (LUNESTA) 2 MG tablet Take 1 tablet (2 mg total) by mouth nightly. Take immediately before bedtime 90 tablet 0   . losartan (COZAAR) 50 MG tablet Take 1 tablet (50 mg total) by mouth daily. 30 tablet 11     No current facility-administered medications for this visit.      Social History   Substance Use Topics   . Smoking status: Never Smoker    . Smokeless tobacco: Never Used   . Alcohol Use: Yes      History reviewed. No  pertinent family history.     Physical Exam:  BP 167/94 mmHg  Pulse 73  Temp(Src) 97.5 F (36.4 C) (Oral)  Ht 1.854 m (6\' 1" )  Wt 84.823 kg (187 lb)  BMI 24.68 kg/m2  Wt Readings from Last 3 Encounters:   08/28/15 84.823 kg (187 lb)   04/18/14 82.555 kg (182 lb)   08/09/13 87.091 kg (192 lb)     GEN: WD WNalert and appropriate in NAD  HENT: NCAT, TMs normal bilaterally, no nasal congestion, OP normal w no exudate or erythema  EYES: PERRL, EOMI, no pallor or scleral icterus, normal conjunctiva  NECK: supple, no thyromegaly or nodules appreciated  LYMPH: no cervical or supraclavicular LAD appreciated  CV: RRR, no m/r/g.  No LE edema.  2+ radial pulses present and equal.  RESP: CTAB, normal effort, no rhonchi or rales  GI: soft, nontender/ nondistended, NABS, no rebound or guarding  SKIN: warm, dry.  no visible rashes or concerning moles  MSK: Normal bulk and tone, normal strength 5/5 and sensation UE / LE  NEURO: No CN deficits noted (II-XII) PERRLA, EOMI, face symmetric, hearing intact to voice, palate raise, shoulder shrug and neck flexion intact, tongue midline. MAE.  PSYCH: normal mood and affect        Depression  Screening  negative  See related Activity or Flowsheet    Functional Ability/Level of Safety    1. Was the patient's timed Up & Go test unsteady or longer than 30 seconds? No    2. Does the patient need help with the phone, transportation, shopping, preparing meals, housework, laundry, medications or managing money?  No    3. Does the patient's home have rugs in the hallway, lack grab bars in the bathroom, lack handrails on the stairs or have poor lighting?  No    4. Have you noticed any hearing difficulties?  No     Assessment    BP 167/94 mmHg  Pulse 73  Temp(Src) 97.5 F (36.4 C) (Oral)  Ht 1.854 m (6\' 1" )  Wt 84.823 kg (187 lb)  BMI 24.68 kg/m2     Vision Screening (required for IPPE only): Patient states eye exam performed elsewhere within past 12 months  Screening EKG (IPPE only): normal EKG, normal sinus rhythm, unchanged from previous tracings    Evaluation of Cognitive Function    Mood/affect: Appropriate  Appearance: neatly groomed, appropriately and adequately nourished  Family member/caregiver input: Not present    Counseling and Referral of Preventive Services    Cardiovascular Disease Screening Tests  Colorectal Cancer Screening  Diabetes Screening    List any risk factors and conditions for which interventions are recommended or underway (based on history, exam and screening): None.    Appropriate personalized health advice furnished: Community-based lifestyle interventions to reduce health risks and promote self-management and wellness    Discussion of Advance Directives: Has an Advanced Directive. A copy has not been provided. Requested to provide.    1. Pure hypercholesterolemia  Continue to treat lioiids  - Comprehensive metabolic panel  - CBC and differential  - Lipid panel  - Urinalysis  - PSA    2. Essential hypertension  Continue losartan  - Comprehensive metabolic panel  - CBC and differential  - Lipid panel  - Urinalysis  - PSA    3. Benign non-nodular prostatic hyperplasia with lower  urinary tract symptoms  Risk and benefit of PSA testing discuswed.   - Urinalysis  - PSA    4. Need  for pneumococcal vaccination  done  - Pneumococcal polysaccharide vaccine 23-valent greater than or equal to 2yo subcutaneous/IM          Cassell Clement, MD

## 2015-09-03 ENCOUNTER — Encounter (INDEPENDENT_AMBULATORY_CARE_PROVIDER_SITE_OTHER): Payer: Self-pay

## 2015-11-21 ENCOUNTER — Other Ambulatory Visit (INDEPENDENT_AMBULATORY_CARE_PROVIDER_SITE_OTHER): Payer: Self-pay | Admitting: Internal Medicine

## 2015-11-21 DIAGNOSIS — G479 Sleep disorder, unspecified: Secondary | ICD-10-CM

## 2015-11-21 MED ORDER — ATORVASTATIN CALCIUM 10 MG PO TABS
10.0000 mg | ORAL_TABLET | Freq: Every day | ORAL | Status: DC
Start: 2015-11-21 — End: 2016-11-25

## 2015-11-21 MED ORDER — ESZOPICLONE 2 MG PO TABS
2.0000 mg | ORAL_TABLET | Freq: Every evening | ORAL | Status: DC
Start: 2015-11-21 — End: 2015-12-05

## 2015-12-05 ENCOUNTER — Encounter (INDEPENDENT_AMBULATORY_CARE_PROVIDER_SITE_OTHER): Payer: Self-pay | Admitting: Internal Medicine

## 2015-12-05 ENCOUNTER — Other Ambulatory Visit (INDEPENDENT_AMBULATORY_CARE_PROVIDER_SITE_OTHER): Payer: Self-pay | Admitting: Internal Medicine

## 2015-12-05 DIAGNOSIS — G479 Sleep disorder, unspecified: Secondary | ICD-10-CM

## 2015-12-05 MED ORDER — ESZOPICLONE 2 MG PO TABS
2.0000 mg | ORAL_TABLET | Freq: Every evening | ORAL | Status: DC
Start: 2015-12-05 — End: 2016-04-07

## 2015-12-05 NOTE — Telephone Encounter (Signed)
Patient received only 30 tabs from 90 tabs ordered because insurance purpose. Pre Authorization done, so pharmacy request a new prescription for 90 tabs.patient aware.

## 2015-12-05 NOTE — Telephone Encounter (Signed)
Faxed Lunesta to [facility].

## 2016-04-07 ENCOUNTER — Encounter (INDEPENDENT_AMBULATORY_CARE_PROVIDER_SITE_OTHER): Payer: Self-pay | Admitting: Internal Medicine

## 2016-04-07 ENCOUNTER — Ambulatory Visit (INDEPENDENT_AMBULATORY_CARE_PROVIDER_SITE_OTHER): Payer: Medicare Other | Admitting: Internal Medicine

## 2016-04-07 VITALS — BP 214/89 | HR 67 | Temp 97.6°F | Ht 73.0 in | Wt 188.0 lb

## 2016-04-07 DIAGNOSIS — Z1211 Encounter for screening for malignant neoplasm of colon: Secondary | ICD-10-CM

## 2016-04-07 DIAGNOSIS — G479 Sleep disorder, unspecified: Secondary | ICD-10-CM

## 2016-04-07 MED ORDER — ESZOPICLONE 3 MG PO TABS
3.0000 mg | ORAL_TABLET | Freq: Every evening | ORAL | 0 refills | Status: DC
Start: 2016-04-07 — End: 2016-05-17
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
## 2016-05-17 ENCOUNTER — Other Ambulatory Visit (INDEPENDENT_AMBULATORY_CARE_PROVIDER_SITE_OTHER): Payer: Self-pay | Admitting: Internal Medicine

## 2016-05-17 DIAGNOSIS — G479 Sleep disorder, unspecified: Secondary | ICD-10-CM

## 2016-05-17 MED ORDER — ESZOPICLONE 3 MG PO TABS
3.0000 mg | ORAL_TABLET | Freq: Every evening | ORAL | 1 refills | Status: DC
Start: 2016-05-17 — End: 2016-06-22

## 2016-06-16 ENCOUNTER — Telehealth (INDEPENDENT_AMBULATORY_CARE_PROVIDER_SITE_OTHER): Payer: Self-pay | Admitting: Internal Medicine

## 2016-06-16 NOTE — Telephone Encounter (Signed)
Rx refill Eszopiclone 3 MG tablet, however, pt is requesting for 2 MG instead of 3 MG. Please send to:      [id number] - [place], [place] - [address] [phone] (Phone)  [phone] (Fax)

## 2016-06-22 ENCOUNTER — Other Ambulatory Visit (INDEPENDENT_AMBULATORY_CARE_PROVIDER_SITE_OTHER): Payer: Self-pay | Admitting: Internal Medicine

## 2016-06-22 DIAGNOSIS — G479 Sleep disorder, unspecified: Secondary | ICD-10-CM

## 2016-06-22 MED ORDER — ESZOPICLONE 3 MG PO TABS
3.0000 mg | ORAL_TABLET | Freq: Every evening | ORAL | 1 refills | Status: DC
Start: 2016-06-22 — End: 2016-09-21

## 2016-06-22 NOTE — Telephone Encounter (Signed)
Spoken with Pharmacist to give a verbally order on Eszopiclone 2mg  po daily at bed time  As per patient request with 90 days supply and one refill. Patient aware is ready to pick up.

## 2016-06-22 NOTE — Telephone Encounter (Signed)
Called patient regarding changes medication Es zopiclone for 2mg  po instead 3mg  . Left an voice mail message.

## 2016-08-17 ENCOUNTER — Ambulatory Visit: Payer: Medicare Other

## 2016-08-17 NOTE — Pre-Procedure Instructions (Signed)
No testing/clearances requested by surgeon or reqd by guidelines

## 2016-08-18 ENCOUNTER — Ambulatory Visit: Admission: RE | Admit: 2016-08-18 | Payer: Medicare Other | Source: Ambulatory Visit | Admitting: Gastroenterology

## 2016-08-18 ENCOUNTER — Encounter: Admission: RE | Payer: Self-pay | Source: Ambulatory Visit

## 2016-08-18 SURGERY — DONT USE, USE 1094-COLONOSCOPY, DIAGNOSTIC (SCREENING)
Anesthesia: Monitor Anesthesia Care | Site: Abdomen

## 2016-08-24 ENCOUNTER — Ambulatory Visit: Payer: Medicare Other | Admitting: Gastroenterology

## 2016-08-24 ENCOUNTER — Ambulatory Visit
Admission: RE | Admit: 2016-08-24 | Discharge: 2016-08-24 | Disposition: A | Discharge status: Home / Self Care | Payer: Medicare Other | Source: Ambulatory Visit | Attending: Gastroenterology | Admitting: Gastroenterology

## 2016-08-24 ENCOUNTER — Encounter
Admission: RE | Disposition: A | Discharge status: Home / Self Care | Payer: Self-pay | Source: Ambulatory Visit | Attending: Gastroenterology

## 2016-08-24 ENCOUNTER — Ambulatory Visit: Payer: Medicare Other | Admitting: Anesthesiology

## 2016-08-24 DIAGNOSIS — E785 Hyperlipidemia, unspecified: Secondary | ICD-10-CM | POA: Insufficient documentation

## 2016-08-24 DIAGNOSIS — Z1211 Encounter for screening for malignant neoplasm of colon: Secondary | ICD-10-CM | POA: Insufficient documentation

## 2016-08-24 DIAGNOSIS — K649 Unspecified hemorrhoids: Secondary | ICD-10-CM | POA: Insufficient documentation

## 2016-08-24 DIAGNOSIS — I10 Essential (primary) hypertension: Secondary | ICD-10-CM | POA: Insufficient documentation

## 2016-08-24 DIAGNOSIS — K5730 Diverticulosis of large intestine without perforation or abscess without bleeding: Secondary | ICD-10-CM | POA: Insufficient documentation

## 2016-08-24 DIAGNOSIS — Z86010 Personal history of colonic polyps: Secondary | ICD-10-CM | POA: Insufficient documentation

## 2016-08-24 DIAGNOSIS — Z7982 Long term (current) use of aspirin: Secondary | ICD-10-CM | POA: Insufficient documentation

## 2016-08-24 HISTORY — DX: Hyperacusis, bilateral: H93.233

## 2016-08-24 HISTORY — DX: Essential (primary) hypertension: I10

## 2016-08-24 HISTORY — PX: COLONOSCOPY, DIAGNOSTIC (SCREENING): SHX174

## 2016-08-24 HISTORY — DX: Unspecified hearing loss, unspecified ear: H91.90

## 2016-08-24 SURGERY — DONT USE, USE 1094-COLONOSCOPY, DIAGNOSTIC (SCREENING)
Anesthesia: Anesthesia General | Site: Anus

## 2016-08-24 MED ORDER — LACTATED RINGERS IV SOLN
INTRAVENOUS | Status: DC
Start: 2016-08-24 — End: 2016-08-24

## 2016-08-24 MED ORDER — PROPOFOL INFUSION 10 MG/ML
INTRAVENOUS | Status: DC | PRN
Start: 2016-08-24 — End: 2016-08-24
  Administered 2016-08-24: 200 ug/kg/min via INTRAVENOUS

## 2016-08-24 MED ORDER — PROPOFOL 10 MG/ML IV EMUL (WRAP)
INTRAVENOUS | Status: DC | PRN
Start: 2016-08-24 — End: 2016-08-24
  Administered 2016-08-24: 100 mg via INTRAVENOUS

## 2016-08-24 SURGICAL SUPPLY — 33 items
BASIN EMESIS ROSE (Patient Supply) ×3 IMPLANT
CANISTER SCT 1500CC SAFELINER LF NS SMRG (Suction) ×2
CANISTER SUCTION 1500 CC LINE (Suction) ×1
CANISTER SUCTION 1500 CC SEMIRIGID 1 (Suction) ×1 IMPLANT
CANISTER SUCTION 1500 CC SEMIRIGID 1 ELBOW FILTER SELF ALIGN LID (Suction) ×1 IMPLANT
CAP BIOPSY (Procedure Accessories) ×1
DEVICE ENDOSCOPIC RAPID EXCHANGE BIOPSY (Procedure Accessories) ×1 IMPLANT
DEVICE ENDOSCOPIC RAPID EXCHANGE BIOPSY CAP OLYMPUS (Procedure Accessories) ×1 IMPLANT
DEVICE ESCP STRL RX BX CAP DISP OLMPS (Procedure Accessories) ×2
GAUZE SPONGE 4X4 NS (Dressing) ×1
GLOVE BIOGEL ULTTOUCH M SZ 7.5 (Glove) ×1
GLOVE SRG PLISPRN 7.5 BGL PI ULTRATOUCH (Glove) ×2
GLOVE SURGICAL 7 1/2 BIOGEL PI (Glove) ×1 IMPLANT
GLOVE SURGICAL 7 1/2 BIOGEL PI ULTRATOUCH M POWDER FREE BEAD CUFF (Glove) ×1 IMPLANT
GOWN CP ELSTC WRIST REG/LG BL (Gown) ×1
GOWN ISL PP PE REG LG LF FULL BCK NK TIE (Gown) ×2 IMPLANT
GOWN ISOLATION REGULAR LARGE FULL BACK NECK TIE ELASTIC CUFF (Gown) ×1 IMPLANT
JELLY LUB EZ LF STRL H2O SOL NGRS TRNLU (Irrigation Solutions) ×2 IMPLANT
JELLY LUBE STRL FLPTOP 4OZ (Irrigation Solutions) ×1
SPONGE GAUZE L4 IN X W4 IN 16 PLY (Dressing) ×1 IMPLANT
SPONGE GAUZE L4 IN X W4 IN 16 PLY MAXIMUM ABSORBENT USP TYPE VII (Dressing) ×1 IMPLANT
SPONGE GAUZE L4 IN X W4 IN 4 PLY HIGH (Sponge) ×1 IMPLANT
SPONGE GAUZE L4 IN X W4 IN 4 PLY NONWOVEN LINT FREE CURITY RAYON (Sponge) ×1 IMPLANT
SPONGE GAUZE VERSA 4PLY 4X4 (Sponge) ×1
SPONGE GZE CTTN CRTY 4X4IN LF NS 16 PLY (Dressing) ×2
SPONGE GZE RYN PLSTR CRTY 4X4IN LF NS 4 (Sponge) ×2
STERILE WATER 1000ML (Solution) ×1
SYRINGE 50 ML GRADUATE NONPYROGENIC DEHP (Syringes, Needles) ×1 IMPLANT
SYRINGE 50 ML GRADUATE NONPYROGENIC DEHP FREE PVC FREE BD MEDICAL (Syringes, Needles) ×1 IMPLANT
SYRINGE MED 50ML LF STRL GRAD N-PYRG (Syringes, Needles) ×2
SYRINGE SLIP-TIP 60CC (Syringes, Needles) ×1
WATER STERILE PVC FREE DEHP FREE 1000 ML (Solution) ×1 IMPLANT
WATER STRL 1000ML PIC LF PVC FR DEHP-FR (Solution) ×2

## 2016-08-24 NOTE — Anesthesia Postprocedure Evaluation (Signed)
Anesthesia Post Evaluation    Patient: Frederick [name]    Procedure(s) with comments:  COLONOSCOPY - COLONOSCOPY    Anesthesia type: General TIVA    Last Vitals:   Vitals:    08/24/16 1200   BP: 183/90   Pulse: 68   Resp: 20   Temp: 36.1 C (97 F)   SpO2: 100%         Patient Location: [facility]    Post Pain: Patient not complaining of pain, continue current therapy    Mental Status: awake and alert    Respiratory Function: tolerating nasal cannula    Cardiovascular: stable    Nausea/Vomiting: patient not complaining of nausea or vomiting    Hydration Status: adequate    Post Assessment: no apparent anesthetic complications, no reportable events and no evidence of recall          Anesthesia Qualified Clinical Data Registry 2018    PACU Reintubation  Did the Patient have general anesthesia with intubation: No        PONV Adult  Is the patient aged 33 or older: Yes  Did the patient receive recieve a general anesthestic: No          PONV Pediatric  Is the patient aged 65-17? No            PACU Transfer Checklist Protocol  Was the patient transferred to the PACU at the conclusion of surgery? Yes  Was a checklist or transfer protocol used? Yes    ICU Transfer Checklist Protocol  Was the patient transferred to the ICU at the conclusion of surgery? No      Post-op Pain Assessment Prior to Anesthesia Care End  Age >=18 and assessed for pain in PACU: Yes  Pacu pain score <7/10: Yes      Perioperative Mortality  Perioperative mortality prior to Anesthesia end time: No    Perioperative Cardiac Arrest  Did the patient have an unanticipated intraoperative cardiac arrest between anesthesia start time and anesthesia end time? No    Unplanned Admission to ICU  Did the patient have an unplanned admission to the ICU (not initially anticipated at anesthesia start time)? No      Signed by: Leilani Able, 08/24/2016 2:49 PM

## 2016-08-24 NOTE — Anesthesia Preprocedure Evaluation (Signed)
Anesthesia Evaluation    AIRWAY    Mallampati: II    TM distance: >3 FB  Neck ROM: full  Mouth Opening:full   CARDIOVASCULAR    cardiovascular exam normal, regular and normal       DENTAL    no notable dental hx     PULMONARY    pulmonary exam normal and clear to auscultation     OTHER FINDINGS              Relevant Problems   No active problems are marked relevant to this note.               Anesthesia Plan    ASA 2     general                     intravenous induction   Detailed anesthesia plan: general IV      Post Op: other  Post op pain management: per surgeon    informed consent obtained    ECG reviewed  pertinent labs reviewed            ===============================================================  Inpatient Anesthesia Evaluation    Patient Name: Frederick [name]  Surgeon: Lestine Mount, MD  Patient Age / Sex: 71 y.o. / male    Medical History:     Past Medical History:   Diagnosis Date   . Abnormal vision    . Diverticulitis    . Hearing loss    . Hyperacusis of both ears    . Hyperlipidemia     controlled on meds   . Hypertension     home readings range from 120s/70s to 160s; per pt has white coat syndrome (BP at 08/28/15 PMD visit 167/94)   . 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 Tinnitus        Past Surgical History:   Procedure Laterality Date   . APPENDECTOMY  1973   . COLONOSCOPY  02/2011   . EXCISION, CYST  12/2006    back   . KNEE ARTHROSCOPY Left 09/2011   . 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 RELEASE, 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   . ROTATOR CUFF REPAIR Left 09/2000    and shaving of bone spur         Allergies:   No Known Allergies      Medications:     Current Facility-Administered Medications   Medication Dose Route Frequency Last Rate Last Dose   . lactated ringers infusion   Intravenous Continuous 20 mL/hr at 08/24/16 1226                Prior to Admission medications    Medication Sig Start Date End Date Taking? Authorizing Provider   aspirin EC 81 MG EC tablet Take 81 mg  by mouth daily.   Yes [provider]   atorvastatin (LIPITOR) 10 MG tablet Take 1 tablet (10 mg total) by mouth daily.  Patient taking differently: Take 10 mg by mouth every evening.     11/21/15  Yes Cassell Clement, MD   Eszopiclone 3 MG tablet Take 1 tablet (3 mg total) by mouth nightly.Take immediately before bedtime  Patient taking differently: Take 2 mg by mouth nightly.Take immediately before bedtime     06/22/16 06/22/17 Yes Cassell Clement, MD   losartan (COZAAR) 50 MG tablet Take 50 mg by mouth daily.   Yes [provider]     Vitals  Temp:  [36.1 C (97 F)] 36.1 C (97 F)  Heart Rate:  [68] 68  Resp Rate:  [20] 20  BP: (183)/(90) 183/90    Wt Readings from Last 3 Encounters:   08/24/16 85.3 kg (188 lb)   04/07/16 85.3 kg (188 lb)   08/28/15 84.8 kg (187 lb)     BMI (Estimated body mass index is 24.13 kg/m as calculated from the following:    Height as of this encounter: 1.88 m (6' 2.02").    Weight as of this encounter: 85.3 kg (188 lb).)  Temp Readings from Last 3 Encounters:   08/24/16 36.1 C (97 F) (Tympanic)   04/07/16 36.4 C (97.6 F) (Oral)   08/28/15 36.4 C (97.5 F) (Oral)     BP Readings from Last 3 Encounters:   08/24/16 183/90   04/07/16 (!) 214/89   08/28/15 (!) 167/94     Pulse Readings from Last 3 Encounters:   08/24/16 68   04/07/16 67   08/28/15 73           Labs:   CBC:  Lab Results   Component Value Date    WBC 5.92 08/28/2015    HGB 16.1 08/28/2015    HCT 49.5 08/28/2015    PLT 235 08/28/2015       Chemistries:  Lab Results   Component Value Date    NA 141 08/28/2015    K 4.6 08/28/2015    CL 106 08/28/2015    CO2 31 (H) 08/28/2015    BUN 15.0 08/28/2015    CREAT 0.8 08/28/2015    GLU 106 (H) 08/28/2015    CA 10.0 08/28/2015    AST 30 08/28/2015       Coags:  No results found for: PT, PTT, INR  _____________________      Signed by: Leilani Able  08/24/16   2:21 PM    =============================================================        Signed by: Leilani Able 08/24/16 2:21 PM

## 2016-08-24 NOTE — Transfer of Care (Signed)
Anesthesia Transfer of Care Note    Patient: Frederick [name]    Procedures performed: Procedure(s) with comments:  COLONOSCOPY - COLONOSCOPY    Anesthesia type: General TIVA    Patient location:Phase I PACU    Last vitals:   Seer PACU RN record    Post pain: Patient not complaining of pain, continue current therapy      Mental Status:sedated    Respiratory Function: tolerating room air    Cardiovascular: stable    Nausea/Vomiting: patient not complaining of nausea or vomiting    Hydration Status: adequate    Post assessment: no apparent anesthetic complications and no reportable events    Signed by: Leilani Able  08/24/16 2:48 PM

## 2016-08-24 NOTE — H&P (Signed)
GI PRE PROCEDURE NOTE    Proceduralist Comments:   Review of Systems and Past Medical / Surgical History performed: Yes     Indications: polyp surveillance    Previous Adverse Reaction to Anesthesia or Sedation (if yes, describe): No    Physical Exam / Laboratory Data (If applicable)   Airway Classification: Class II    General: Alert and cooperative  Lungs: Lungs clear to auscultation  Cardiac: RRR, normal S1S2.    Abdomen: Soft, non tender. Normal active bowel sounds  Other:     No labs drawn    American Society of Anesthesiologists (ASA) Physical Status Classification:   ASA 2 - Patient with mild systemic disease with no functional limitations  Anesthesia ASA Score: 2          Planned Sedation:   Deep sedation with anesthesia    Attestation:   SHANE BADEAUX has been reassessed immediately prior to the procedure and is an appropriate candidate for the planned sedation and procedure. Risks, benefits and alternatives to the planned procedure and sedation have been explained to the patient or guardian:  yes        Signed by: Lestine Mount

## 2016-08-25 ENCOUNTER — Encounter: Payer: Self-pay | Admitting: Gastroenterology

## 2016-09-01 ENCOUNTER — Encounter (INDEPENDENT_AMBULATORY_CARE_PROVIDER_SITE_OTHER): Payer: Self-pay | Admitting: Internal Medicine

## 2016-09-20 ENCOUNTER — Telehealth (INDEPENDENT_AMBULATORY_CARE_PROVIDER_SITE_OTHER): Payer: Self-pay | Admitting: Internal Medicine

## 2016-09-20 NOTE — Telephone Encounter (Signed)
Patient need refills on Eszopiclone 2mg .  Pharmacy is [id number] - [place], [place] - [address]

## 2016-09-21 ENCOUNTER — Other Ambulatory Visit (INDEPENDENT_AMBULATORY_CARE_PROVIDER_SITE_OTHER): Payer: Self-pay | Admitting: Internal Medicine

## 2016-09-21 DIAGNOSIS — G479 Sleep disorder, unspecified: Secondary | ICD-10-CM

## 2016-09-22 MED ORDER — ESZOPICLONE 3 MG PO TABS
3.0000 mg | ORAL_TABLET | Freq: Every evening | ORAL | 1 refills | Status: DC
Start: 2016-09-22 — End: 2017-01-20

## 2016-09-23 ENCOUNTER — Ambulatory Visit: Payer: Medicare Other | Attending: Hand Surgery

## 2016-09-23 DIAGNOSIS — M720 Palmar fascial fibromatosis [Dupuytren]: Secondary | ICD-10-CM | POA: Insufficient documentation

## 2016-09-29 ENCOUNTER — Encounter (INDEPENDENT_AMBULATORY_CARE_PROVIDER_SITE_OTHER): Payer: Self-pay

## 2016-10-05 ENCOUNTER — Encounter: Payer: Self-pay | Admitting: Radiation Oncology

## 2016-10-05 ENCOUNTER — Ambulatory Visit: Payer: Self-pay | Admitting: Radiation Oncology

## 2016-10-05 DIAGNOSIS — M720 Palmar fascial fibromatosis [Dupuytren]: Secondary | ICD-10-CM

## 2016-10-05 NOTE — Progress Notes (Addendum)
10/05/16 0940   Radiation Therapy Patient Care - Skin    Site bilateral hands   Comfort Alteration   Karnofsky Performance Score 90%-can perform normal activity, minor signs of disease   Fatigue 1   Pain Score 0  (increases 1/10 when making fists)   Skin Alteration   Tumor Necrosis none   Bleeding none   Infection none   Mucous Membrane Alteration   Drainage 0-absent   Drainage Odor 0-absent   Emotional Alteration   Coping 0-effective   Vital signs   Temp 97.8 F (36.6 C)   Temp Source Oral   Heart Rate 69   Resp Rate 16   BP (!) 199/94     Grip Test    Pt is right hand dominant          RIGHT HAND   LEFT HAND   69.1 84.1   79.4 82.7   80.7 84.9

## 2016-10-07 ENCOUNTER — Ambulatory Visit: Payer: Self-pay | Admitting: Urology

## 2016-10-10 NOTE — Consults (Signed)
Dear Dr. Renelda Loma,     I had the pleasure of meeting your patient today, Frederick [name], for  recommendations regarding the role of radiation therapy of his progressive  Dupuytren disease bilaterally.  Frederick [name] reports that his Dupuytren  disease began in the early 2000s with cording in bilateral hands,  for which he underwent multiple surgeries, including needle  aponeurotomies on April 05, 2006; January 02, 2008 ; and August 04, 2010 in bilateral hands at the [facility] in [place], [place]  [phone]) by Dr. Roderic Scarce.   He also reports additional surgeries  performed by you including excisions of contractured areas, on April 23, 2013 and August 16, 2014.     He reports that over the past year, the left middle/ring/small fingers  began contracting along with the right index/ring/small fingers for   which you suggested consideration of radiation therapy as further surgery  was not strongly recommended due to an increased risk of damaging the   nerve and artery.  You thus referred him for radiation therapy with the  goal of prevention of progression, with plan for consideration of possible surgical  excision after radiation therapy.      At today's visit Frederick [name] states that overall he feels well outside of  his pain symptoms described above.  He specifically endorses itching and  burning sensations in bilateral hands, tension feelings in bilateral  hands, pressure like sensation and tightness in bilateral hands, pain  during motion in bilateral hands, and significant contracture affecting  function in bilateral hands.  He denies pain at rest.     REVIEW OF SYSTEMS:  He reports asymptomatic nodules in bilateral arches overlying  each foot. No history of diabetes, liver disease, nicotine use.    Otherwise, per HPI.     PAST MEDICAL HISTORY:  High blood pressure, high cholesterol, tinnitus, bilateral Dupuytren  disease, bilateral Ledderhose disease.      PAST SURGICAL HISTORY:  RIGHT HAND:  1.   Needle aponeurotomy April 05, 2006 (operative report not  available).   2.  Needle aponeurotomy January 02, 2008 (operative report available).  3.  Dupuytren contracture in right ring finger /right index finger /right  little finger on April 20, 2013 by Dr. Renelda Loma.     LEFT HAND:    1.  Needle aponeurotomy April 05, 2006 by Dr. Roderic Scarce (operative report  not available).    2.  Needle aponeurotomy January 02, 2008 by Dr. Renelda Loma (operative report not  available).   3.  Needle aponeurotomy August 04, 2010 by Dr. Roderic Scarce (operative report not  available.   4.  Extension of Dupuytren contracture of the left little finger and left  ring finger August 20, 2013 by Dr. Renelda Loma.     SURGICAL HISTORY:  Right knee meniscus surgery, multiple abdominal surgeries.     RADIATION HISTORY:  Frederick [name] has never been treated with ionizing radiation treatments  before.  He does not have a pacemaker in place.  He does not have any  autoimmune disease such as lupus, scleroderma, ulcerative colitis or   Crohn's disease.  He has never been treated with chemotherapy before.     FAMILY HISTORY:  No known history of Dupuytren disease or Ledderhose  disease.     SOCIAL HISTORY:  He drinks a few glasses of alcohol per night  He is  married with 2 children.  His children do not have a history of Dupuytren or  Ledderhose disease.  ALLERGIES:  No known drug allergies.     PHYSICAL EXAMINATION:    GENERAL:  No acute distress, alert and oriented x3.  Pleasantly conversant.    RIGHT HAND:    LITTLE FINGER: Right hand little finger 90-degree contracture at the PIP  Joint.   RING FINGER: multiple nodules overlying the area between the MCP and  PIP;  45-degree contracture at MCP joint, 90-degree contracture at the PIP  joint.   MIDDLE FINGER: Twenty degree contracture at MCP joint.  INDEX FINGER: Postsurgical changes, 30-degree contracture at the DIP  joint, firm nodule palpated between MCP and PIP joint.  THUMB: Cording leading to  interphalangeal joint.  Intrathenar cording  present, nodules palpated over MCP joint.     LEFT HAND:    LITTLE FINGER: Ninety degree contracture, proximal interphalangeal joint.  RING FINGER: 75 degree contracture at PIP joint. Nodularity palpated   between MCP and PIP joints.    MIDDLE FINGER:  Seventy five degree contracture at the PIP joint.    INDEX FINGER:  Spared.    THUMB:  Spared.      RIGHT FOOT:  2 to 3 soft nodules overlying the central arch.     LEFT FOOT:  Single 1.5 cm nodule overlying the central arch.       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  He is seen today for  discussion of the potential role of radiation therapy in his care.     Over the course of 50 minutes, I had a detailed discussion with Mr.  [name] and his wife regarding general principles and management of  Dupuytren disease involving radiation therapy.  We reviewed that there is  extensive data supporting the use of radiation in the early prevention of disease  progression, to reduce the rate of progression to symptomatic contracture,  however the role of radiation in the presence of severe  contracture is not well-defined.  The patient does have active disease  which has shown subjective growth over the past few months, however  Given the severe contractures, we do not recommend radiation in his   current state of disease given that he is unlikely to benefit.      If the patient were able to undergo additional surgery to release his contractures, there may  be potential benefit for radiation after contracture release.      He has a history of multiple surgeries with resultant scar tissue which may reduce  the effectiveness of adjuvant radiation therapy.     Mr. and Mrs. Stenseth  understand our recommendations and are interested in  revisiting the possibility of additional surgery, they will reach out to [facility] for these recommendations.      Mr. and Mrs. [name] asked a number of excellent questions, all of which  were answered to their apparent satisfaction today.     Dr. Renelda Loma, thank you for the privilege of allowing [place] to provide  consultation with this charming man.  If you have any further questions,  please do not hesitate to contact my clinic.    ATTENDING ADDENDUM:    I agree with the assessment and plan as outlined by the resident physician.  I was present for visit and verified and performed a history and physical exam.  We discussed the rationale, risks, alternative, benefits and potential short and long term toxicites of radiation therapy in detail.  We reviewed the simulation and treatment planning process as well as the need for referral to other appropriate services/providers for facilitative or supportive measures during or after the course of therapy.      CCC:    Gopal K. Tresea Mall, MD  Johnanna Schneiders and Medical Director  Department of Radiation Oncology  [facility]  [facility]  [address]  [place], [place] [address]    P: [phone]  F: [phone]  E: [email or web address]

## 2016-11-25 ENCOUNTER — Other Ambulatory Visit (INDEPENDENT_AMBULATORY_CARE_PROVIDER_SITE_OTHER): Payer: Self-pay | Admitting: Internal Medicine

## 2016-11-25 MED ORDER — ATORVASTATIN CALCIUM 10 MG PO TABS
10.0000 mg | ORAL_TABLET | Freq: Every day | ORAL | 3 refills | Status: DC
Start: 2016-11-25 — End: 2017-11-07

## 2016-12-17 ENCOUNTER — Other Ambulatory Visit (INDEPENDENT_AMBULATORY_CARE_PROVIDER_SITE_OTHER): Payer: Self-pay | Admitting: Internal Medicine

## 2016-12-17 NOTE — Telephone Encounter (Signed)
Name, strength, directions of requested refill(s):  losartan (COZAAR) 50 MG tablet  Eszopiclone 2 MG tablet    Pharmacy to send refill to or patient to pick up rx from office (mark requested pharmacy in [place]):  FOR LOSARTAN:    [facility] - [place], [place] - [address] AT [facility] to [facility]  [address] [place] [address]  Phone: [phone] Fax: [phone]    [id number] [place], [place] - [phone] AT [facility]  [address] [place] [address]  Phone: [phone] Fax: [phone]    [id number] - [place], [place] - [address]  [address] [place] [address]  Phone: [phone] Fax: [phone]  Marland Kitchen ESZOPICLONE 2 MG    Please mark "X" next to the preferred call back number:    Mobile:   Telephone Information:   Mobile [phone]    X   Home: [phone]    Work:           Next visit: Visit date not found

## 2016-12-17 NOTE — Telephone Encounter (Signed)
Pt was trying to refill meds on mychart and relized it was the wrong dosage for eszopiclone. It says the prescript is eszopiclone 3 mg and the pt has been taking 2 mg    Pt also could not see the other medicine he takes losartan 50 mg     Please call @ [phone]

## 2016-12-20 MED ORDER — ESZOPICLONE 2 MG PO TABS
2.0000 mg | ORAL_TABLET | Freq: Every evening | ORAL | 1 refills | Status: DC
Start: 2016-12-20 — End: 2016-12-21

## 2016-12-21 ENCOUNTER — Other Ambulatory Visit (INDEPENDENT_AMBULATORY_CARE_PROVIDER_SITE_OTHER): Payer: Self-pay | Admitting: Internal Medicine

## 2016-12-21 MED ORDER — ESZOPICLONE 2 MG PO TABS
2.0000 mg | ORAL_TABLET | Freq: Every evening | ORAL | 1 refills | Status: DC
Start: 2016-12-21 — End: 2017-06-24

## 2016-12-21 MED ORDER — LOSARTAN POTASSIUM 50 MG PO TABS
50.0000 mg | ORAL_TABLET | Freq: Every day | ORAL | 3 refills | Status: DC
Start: 2016-12-21 — End: 2016-12-22

## 2016-12-22 ENCOUNTER — Other Ambulatory Visit (INDEPENDENT_AMBULATORY_CARE_PROVIDER_SITE_OTHER): Payer: Self-pay | Admitting: Internal Medicine

## 2016-12-22 ENCOUNTER — Telehealth (INDEPENDENT_AMBULATORY_CARE_PROVIDER_SITE_OTHER): Payer: Self-pay | Admitting: Internal Medicine

## 2016-12-22 NOTE — Telephone Encounter (Signed)
Spoken with patient Geneticist, molecular bc/bs [id number] # [id number] [phone]  )For [place] PA. Frederick [name] has been approval from 12/22/16- 12/22/2017. Patient aware and refills faxed to pharmacy.

## 2016-12-22 NOTE — Telephone Encounter (Signed)
Patient wants to know about medication PA. Patient # [phone]

## 2016-12-24 ENCOUNTER — Ambulatory Visit: Payer: Medicare Other | Attending: Hand Surgery

## 2016-12-24 DIAGNOSIS — M720 Palmar fascial fibromatosis [Dupuytren]: Secondary | ICD-10-CM | POA: Insufficient documentation

## 2016-12-24 DIAGNOSIS — Z510 Encounter for antineoplastic radiation therapy: Secondary | ICD-10-CM | POA: Insufficient documentation

## 2017-01-20 ENCOUNTER — Institutional Professional Consult (permissible substitution): Payer: Self-pay | Admitting: Radiation Oncology

## 2017-01-20 DIAGNOSIS — M720 Palmar fascial fibromatosis [Dupuytren]: Secondary | ICD-10-CM

## 2017-01-20 NOTE — Progress Notes (Deleted)
Subjective:       Patient ID: Frederick [name] is a 71 y.o. male.    HPI    {Common ambulatory SmartLinks:19316}    Review of Systems        Objective:    Physical Exam        Assessment:       ***      Plan:      Procedures  No orders of the defined types were placed in this encounter.      ***

## 2017-01-20 NOTE — H&P (Signed)
Patient Type: O     ATTENDING PHYSICIAN: Lodema Pilot, MD     Dear Dr. Renelda Loma,      By courtesy of your referral, we had the pleasure of meeting with [name] today regarding the role of radiation therapy in the management of his progressive bilateral Dupuytren disease.     HISTORY OF PRESENT ILLNESS:  [name] is a 71 year old gentleman with a history of bilateral Dupuytren disease.  He was seen in consult in our clinic initially on October 05, 2016.  Please refer to that note for additional information.  Since our last visit, [name] was seen by Dr. Marca Ancona in [place].      On Dec 08, 2016, he underwent partial palmar fasciotomy of the left middle, ring, and little fingers by Dr. Manuella Ghazi.      On Dec 09, 2016.  He underwent a partial palmar fasciotomy of the right index, ring, and little fingers by Dr. Manuella Ghazi.      Since his surgery,  [name] reports improvement in all treated fingers except the left little finger, which has remained contracted at 90 degrees.  He says he has not developed any new recontracture since the procedure and presents to our clinic for radiation therapy.     REVIEW OF SYSTEMS:  The patient denies any fevers, chills, nausea, vomiting.  Denies any pain. He is right-hand dominant.  Denies Peyronie's disease or pain with erections.  Denies other review of systems negative.     PAST MEDICAL HISTORY: Reviewed and unchanged.    Bilateral Dupuytren disease, bilateral Ledderhose disease, high blood pressure, hypertension, hyperlipidemia, tinnitus.     PAST SURGICAL HISTORY: Reviewed and unchanged. Additionally,  Needle aponeurotomy right index, ring, and little finger for Dupuytren's contracture on Dec 09, 2016.  Needle aponeurotomy left middle, ring, and little finger for Dupuytren contracture on Dec 08, 2016.     RADIATION HISTORY: Reviewed and unchanged.    No prior radiation treatments.  No contraindication to radiation therapy.     FAMILY HISTORY:  Reviewed and unchanged.  No known  history of Dupuytren disease or Ledderhose disease.     MEDICATIONS: Reviewed and unchanged.     ALLERGIES: No known drug allergies.     PHYSICAL EXAMINATION:  GENERAL:  Alert and oriented x3, no acute distress,  pleasant and conversant.  EXTREMITIES:    Right hand:  Little finger: 30 degrees of contracture at the PIP joint, previously 90 degrees. Garrods pads.  Ring finger:  Multiple nodules overlying the area between the MCP and PIP 8 degrees of contracture previously 45 degrees.    Middle finger:  A 5-degree contracture previously 20 degrees at the MCP joint. Garrods pads.  Index finger:  Ten degrees of contracture at the DIP joint, previously 30 degrees, a firm nodule palpated in the MCP and PIP joint. Garrods pads.   Thumb: Cording leading to interphalangeal joint. Nodules palpated over MCP joint.    Intrathenar cording present.       Left hand:    Little finger: 90-degree contracture at the PIP joint, previously 90 degrees.    Ring finger:  10 degrees of contracture at the PIP joint, previously 75 degrees.  Nodularity palpated between the MCP and PIP joints.    Middle finger: 13 degree contracture at the PIP joint, previously 75 degrees.    Index finger:  Spared.  Thumb: Spared. Intrathenar cording present.       Right foot:  2 to 3  soft nodules over and the central arch.  Unchanged.  Nontender.      Left foot:  A single large 1.5 cm nodule overlying the central arch,  unchanged from prior examination, nontender.     ASSESSMENT AND PLAN:  [name] is a 71 year old gentleman with a longstanding history of severe Dupuytren contraction, bilateral hands, s/p multiple needle aponeurotomies and contracture resections, who recently underwent needle aponeurotomy on left hand (middle, ring, and little fingers) and right hand (index, ring, and little fingers) with significant improvement in flexion contracture in multiple fingers, presenting for postoperative radiation therapy.      We again reviewed the patient's  medical history.  He has no contraindications to radiation therapy.  Over the course of 30 minutes, we had a detailed discussion re-discussing the role of radiation therapy in the management of Dupuytren and Ledderhose disease.  We reviewed the extensive data supporting the use of radiation and we reviewed the role and rationale of radiation therapy to reduce the rate of progression to symptomatic contracture.  At this time did not recommend radiation therapy to his bilateral Ledderhose disease given that there has been no disease progression and he is relatively asymptomatic.      We discussed the potential acute and long-term side effects of radiation treatment including but not limited to skin changes, erythema, desquamation, joint stiffness.  Time was provided for the patient and his wife to ask questions.  All questions were answered to their apparent satisfaction.  The patient would like to proceed with radiation therapy to his bilateral hands.   The patient signed a written informed consent and proceed with a planning session today.      We anticipate starting treatment on Monday, July 16.  We plan to deliver 5 daily treatments of radiation therapy over Monday through Friday over 1 week, followed by 10 to 14-week break, with completion of his last 5 treatments delivered daily over a week.     Dr. Renelda Loma, thank you again for the privilege of being involved in the care of this charming gentleman.  If you have any questions or concerns, please do not stated hesitate to contact us.      The patient was seen by and discussed with attending, Lodema Pilot.        ATTENDING ADDENDUM:    I agree with the assessment and plan as outlined by the resident physician.  I was present for visit and verified and performed a history and physical exam.  We discussed the rationale, risks, alternative, benefits and potential short and long term toxicites of radiation therapy in detail.  We reviewed the simulation and treatment  planning process as well as the need for referral to other appropriate services/providers for facilitative or supportive measures during or after the course of therapy.      CCC:50/93min    Gopal K. Tresea Mall, MD  Johnanna Schneiders and Medical Director  Department of Radiation Oncology  [facility]  [facility]  [address]  [place], [place] [address]    P: [phone]  F: [phone]  E: [email or web address]               D:  01/20/2017 16:55 PM by Dr. Lars Pinks, DO [phone])  T:  01/20/2017 18:20 PM by NTS      (Conf: [id number]) (Doc ID: [id number])

## 2017-01-20 NOTE — Progress Notes (Unsigned)
Grip Test    Pt is right hand dominant          RIGHT HAND   LEFT HAND   69.6 72   73.7 73   76 70.1

## 2017-01-24 ENCOUNTER — Ambulatory Visit: Payer: Medicare Other | Attending: Hand Surgery

## 2017-01-24 DIAGNOSIS — M720 Palmar fascial fibromatosis [Dupuytren]: Secondary | ICD-10-CM | POA: Insufficient documentation

## 2017-01-24 DIAGNOSIS — Z510 Encounter for antineoplastic radiation therapy: Secondary | ICD-10-CM | POA: Insufficient documentation

## 2017-02-07 ENCOUNTER — Other Ambulatory Visit: Payer: Self-pay

## 2017-02-07 NOTE — Patient Instructions (Signed)
Patient Information  Dupuytren's Contracture/Ledderhose disease    Dupuytren's (du-pwe-TRANZ) contracture is a hand deformity that usually develops slowly over a period of several years.  Dupuytren's contracture affects a layer of tissue that lies under the skin of your palm. Knots of tissue form under the skin and eventually form a thick cord that can pull one or more of your fingers into a bent position. A related condition sometimes occurs in the soles of the feet, where it is called Ledderhose disease, after the [place] doctor who first described it. Radiation is a non -surgical option for treatment of Dupuytren's disease in its early stage.       Consultation  You will be scheduled for an initial consultation appointment with a radiation oncologist in the Department of Radiation Oncology. During your visit, the radiation oncologist will complete a thorough physical assessment and determine if radiation therapy is an appropriate treatment for you.      Treatment Planning   A planning session called a simulation will be scheduled for you after consultation.  During the simulation, your hands will be positioned in a mold and the physician will place marks on the skin to determine exactly where the radiation will be delivered.  The usual course of treatment is five brief daily radiation treatments in one week with the process repeated again in 10-12 weeks. Risks and side effects are minimal.     Supportive Care during Treatment  During your treatment and for six weeks after therapy avoid major stress factors for your hands or feet.  -No mechanical stress, such as carrying heavy weights  -No Physical stress. Avoid exposure to extreme heat or cold.  -No chemical stress. Avoid alcohol, chemicals, irritating liquids    Use a topical lotion or ointment daily to moisturize the skin and to maintain elasticity.    Some paraben-free moisturizers include:    Aloe       Cetaphil  Aquaphor      Cocoa Butter  Aveeno      Shea  Butter  Calendula cream     Miaderm    Follow up  A follow up appointment should be scheduled approximately three months and again at six months after completion of your radiation therapy. Your nurse will provide you with after care instructions on your last day of treatment.        .

## 2017-02-10 ENCOUNTER — Ambulatory Visit: Payer: Self-pay | Admitting: Radiation Oncology

## 2017-02-10 DIAGNOSIS — M720 Palmar fascial fibromatosis [Dupuytren]: Secondary | ICD-10-CM

## 2017-02-10 NOTE — Progress Notes (Signed)
02/10/17 1427   Radiation Therapy Patient Care - Skin    Fraction  4   Daily Dose 300   Total Dose 1200   Prescribed Dose 3000   Keloid no   Planned Boost  No   Concurrent Therapy no   Site hands   Comfort Alteration   Karnofsky Performance Score 90%-can perform normal activity, minor signs of disease   Fatigue 1   Pain Score 0   Skin Alteration   Radiation Dermatitis 0-none   Tumor Necrosis N/A   Bleeding none   Infection none   Skin Care Aquaphor   Mucous Membrane Alteration   Drainage 0-absent   Drainage Odor 0-absent   Emotional Alteration   Coping 0-effective     Grip Test    Pt is right hand dominant          RIGHT HAND   LEFT HAND   70.2 70.5   71.3 68.1   66.4 63.7                 [facility] RADIATION ONCOLOGY WEEKLY TREATMENT NOTE    Date Time:  2:36 PM 02/10/2017   Patient Name: Frederick [name], Frederick [name]  Diagnosis:   1. Dupuytren's contracture of both hands         SUBJECTIVE:   Frederick [name] is a 71 y.o. male who is seen today for on-treatment clinical management during radiation therapy.     Pertinent subjective/objective findings are documented in the above flowsheet      Objective Findings:   There were no vitals taken for this visit.       Gen: Well, NAD  R Hand: unchanged  L Hand: unchanged  R Foot: stable mid arch 1.5 cm nodule  L Foot: stable mid arch 1.5 cm nodule  Wt Readings from Last 10 Encounters:   08/24/16 85.3 kg (188 lb)   04/07/16 85.3 kg (188 lb)   08/28/15 84.8 kg (187 lb)   04/18/14 82.6 kg (182 lb)   08/09/13 87.1 kg (192 lb)   04/16/13 85.7 kg (189 lb)   04/10/13 85.7 kg (189 lb)   02/22/13 85.3 kg (188 lb)   01/18/13 85.3 kg (188 lb)     Last Weight:            Medications:           Encounter Medications:   . aspirin EC 81 MG EC tablet   . atorvastatin (LIPITOR) 10 MG tablet   . eszopiclone (LUNESTA) 2 MG tablet   . losartan (COZAAR) 50 MG tablet              Risk & Benefits of the new medication(s) were explained to the patient (and family) who verbalized understanding & agreed to the  treatment plan. Patient (family) encouraged to contact me/clinical staff with any questions/concerns.     LABS AND RADIOLOGY:     CBC with Diff.  Lab Results   Component Value Date/Time    WBC 5.92 08/28/2015 10:00 AM    WBC NONE SEEN 02/22/2013 10:04 AM    HGB 16.1 08/28/2015 10:00 AM    HGB 15.7 08/09/2013 11:26 AM    HCT 49.5 08/28/2015 10:00 AM    PLT 235 08/28/2015 10:00 AM    PLT 223 08/09/2013 11:26 AM    MCV 99.2 08/28/2015 10:00 AM    RDW 14 08/28/2015 10:00 AM    NEUTRO 73 08/28/2015 10:00 AM    LYMPHOCYTESA 21 08/28/2015 10:00 AM  EOSINOPHILSA 1 08/28/2015 10:00 AM    IMMATUREGRAN 0 08/28/2015 10:00 AM    NEUTROABS 4.32 08/28/2015 10:00 AM    ABSOLUTEIMMA 0.01 08/28/2015 10:00 AM       Chemistries   Lab Results   Component Value Date/Time    NA 141 08/28/2015 10:00 AM    K 4.6 08/28/2015 10:00 AM    CL 106 08/28/2015 10:00 AM    CL 104 08/09/2013 11:26 AM    CO2 31 (H) 08/28/2015 10:00 AM    BUN 15.0 08/28/2015 10:00 AM    CREAT 0.8 08/28/2015 10:00 AM    CREAT 0.89 08/09/2013 11:26 AM    GLU 106 (H) 08/28/2015 10:00 AM    CA 10.0 08/28/2015 10:00 AM    BILITOTAL 1.8 (H) 08/28/2015 10:00 AM    AST 30 08/28/2015 10:00 AM    ALT 31 08/28/2015 10:00 AM    ALKPHOS 51 08/28/2015 10:00 AM             ASSESSMENT:     Tolerating treatment.    Plan:  1.Continue Radiation  2. F/u in 12 weeks      Arlee Muslim, MD  Department of Radiation Oncology  [facility]  [phone]

## 2017-02-10 NOTE — Progress Notes (Addendum)
02/10/17 1427   Radiation Therapy Patient Care - Skin    Fraction  4   Daily Dose 300   Total Dose 1200   Prescribed Dose 3000   Keloid no   Planned Boost  No   Concurrent Therapy no   Site hands   Comfort Alteration   Karnofsky Performance Score 90%-can perform normal activity, minor signs of disease   Fatigue 1   Pain Score 0   Skin Alteration   Radiation Dermatitis 0-none   Tumor Necrosis N/A   Bleeding none   Infection none   Skin Care Aquaphor   Mucous Membrane Alteration   Drainage 0-absent   Drainage Odor 0-absent   Emotional Alteration   Coping 0-effective     Grip Test    Pt is right hand dominant          RIGHT HAND   LEFT HAND   70.2 70.5   71.3 68.1   66.4 63.7

## 2017-03-29 ENCOUNTER — Encounter (INDEPENDENT_AMBULATORY_CARE_PROVIDER_SITE_OTHER): Payer: Self-pay

## 2017-04-25 ENCOUNTER — Ambulatory Visit: Payer: Medicare Other | Attending: Hand Surgery

## 2017-04-25 DIAGNOSIS — Z510 Encounter for antineoplastic radiation therapy: Secondary | ICD-10-CM | POA: Insufficient documentation

## 2017-04-25 DIAGNOSIS — M720 Palmar fascial fibromatosis [Dupuytren]: Secondary | ICD-10-CM | POA: Insufficient documentation

## 2017-05-03 DIAGNOSIS — M720 Palmar fascial fibromatosis [Dupuytren]: Secondary | ICD-10-CM

## 2017-05-03 NOTE — Patient Instructions (Signed)
Department of Radiation Oncology  End of Radiation Treatment Instructions    You have completed 10 radiation treatments to your hands beginning on 02/07/2017 and ending on 05/06/2017.  You were treated by Dr. Lodema Pilot.  Your follow up appointment is in three months and again at six months.  Please call us at [phone] if you have any questions or concerns or to schedule your appointments.     We will send a summary of your radiation treatment to all of the physicians you have told us are involved in your care.  Please make a follow-up appointment with your referring physician  and any others as appropriate.      Avoid mechanical, physical, and chemical stress for four to six weeks after radiation therapy.  Do not carry heavy weights, expose the site to extreme heat or cold, and avoid alcohol, chemicals and irritating liquids.      Use topical lotions or ointments daily to moisturize the skin and to maintain elasticity of the skin.  Some recommended paraben-free moisturizers:  Aloe       Cetaphil      Aquaphor      Cocoa Butter  Aveeno      Shea Butter  Calendula       Miaderm    Test your performance with mechanical exercises.    -Spread your fingers and test your finger span.   -Extend your hand leaning against the wall   -Test your hand with the table top test    Examine your hands and/or feet every three months.  Count nodules and cords and record any noticeable changes on the Patient Self Assessment Form.  Bring the forms to your follow up visits at three and six months after completion of treatment.          Patient Self  Assessment Form     Since radiation therapy, have you noticed any of the following symptoms?   Which signs? Right Hand       When? And Where?  Improved since radiotherapy? Worse since radiotherapy? If worsening: Inside (I), Outside (O) or border (B) of radiation field   Itching/burning sensation? [N] [Y]      Increased tension in your palm? [N] [Y]      Increased pressure at Grip? [N]  [Y]      Pain at rest? [N] [Y]      Pain at strain? [N] [Y]      Skin changes? [N] [Y]      Nodules you can feel? [N] [Y]   How many?      Cords you can feel? [N] [Y]  How many?      Abnormally contracted fingers? [N] [Y]  Which ones?      Any other complaints? [N] [Y]      Which signs? Left Hand       When? And Where?  Improved since radiotherapy? Worse since radiotherapy? If worsening: Inside (I), Outside (O) or border (B) of radiation field   Itching/burning sensation? [N] [Y]      Increased tension in your palm? [N] [Y]      Increased pressure at Grip? [N] [Y]      Pain at rest? [N] [Y]      Pain at strain? [N] [Y]      Skin changes? [N] [Y]      Nodules you can feel? [N] [Y]  How many?      Cords you can feel? [N] [Y]  How many?  Abnormally contracted fingers? [N] [Y]  Which ones?      Any other complaints? [N] [Y]

## 2017-05-05 ENCOUNTER — Other Ambulatory Visit: Payer: Self-pay | Admitting: Radiation Oncology

## 2017-05-05 ENCOUNTER — Ambulatory Visit: Payer: Self-pay | Admitting: Radiation Oncology

## 2017-05-05 DIAGNOSIS — M720 Palmar fascial fibromatosis [Dupuytren]: Secondary | ICD-10-CM

## 2017-05-05 NOTE — Progress Notes (Addendum)
05/05/17 1419   Radiation Therapy Patient Care - Skin    Fraction  9   Daily Dose 300   Total Dose 2700   Prescribed Dose 3000   Keloid no   Planned Boost  No   Concurrent Therapy no   Site Hand, bilateral   Comfort Alteration   Karnofsky Performance Score 90%-can perform normal activity, minor signs of disease   Fatigue 1   Pain Score 0  (Sensation of tightness bilat hands.)   Skin Alteration   Radiation Dermatitis 0-none   Tumor Necrosis N/A   Bleeding none   Infection none   Skin Care Aquaphor   Mucous Membrane Alteration   Drainage 0-absent   Drainage Odor 0-absent   Emotional Alteration   Coping 0-effective     Grip Test    Pt is Right hand dominant          RIGHT HAND   LEFT HAND   83.9 73.6   79.2 71.2   78.1 69.8

## 2017-05-05 NOTE — Progress Notes (Signed)
05/05/17 1419   Radiation Therapy Patient Care - Skin    Fraction  9   Daily Dose 300   Total Dose 2700   Prescribed Dose 3000   Keloid no   Planned Boost  No   Concurrent Therapy no   Site Hand, bilateral   Comfort Alteration   Karnofsky Performance Score 90%-can perform normal activity, minor signs of disease   Fatigue 1   Pain Score 0  (Sensation of tightness bilat hands.)   Skin Alteration   Radiation Dermatitis 0-none   Tumor Necrosis N/A   Bleeding none   Infection none   Skin Care Aquaphor   Mucous Membrane Alteration   Drainage 0-absent   Drainage Odor 0-absent   Emotional Alteration   Coping 0-effective     Grip Test    Pt is Right hand dominant          RIGHT HAND   LEFT HAND   83.9 73.6   79.2 71.2   78.1 69.8                   [facility] RADIATION ONCOLOGY WEEKLY TREATMENT NOTE    Date Time:  2:38 PM 05/05/2017   Patient Name: Frederick [name], Frederick [name]  Diagnosis:   1. Dupuytren's contracture of both hands         SUBJECTIVE:   Frederick [name] is a 71 y.o. male who is seen today for on-treatment clinical management during radiation therapy.     Pertinent subjective/objective findings are documented in the above flowsheet      Objective Findings:   There were no vitals taken for this visit.  Right hand:  Little finger: 40 degrees of contracture at the PIP joint, previously 90 degrees. Garrods pads.  Ring finger:  Multiple nodules overlying the area between the MCP and PIP 8 degrees of contracture previously 50 degrees.    Middle finger:  A 5-degree contracture previously 20 degrees at the MCP joint. Garrods pads.  Index finger:  Ten degrees of contracture at the DIP joint, previously 30 degrees, a firm nodule palpated in the MCP and PIP joint. Garrods pads.   Thumb: Cording leading to interphalangeal joint. Nodules palpated over MCP joint.    Intrathenar cording present.       Left hand:    Little finger: 90-degree contracture at the PIP joint, previously 90 degrees.    Ring finger:  10 degrees of contracture at  the PIP joint, previously 75 degrees.  Nodularity palpated between the MCP and PIP joints.    Middle finger: 13 degree contracture at the PIP joint, previously 75 degrees.    Index finger:  Spared.  Thumb: Spared. Intrathenar cording present.       Right foot:  2 to 3 soft nodules over and the central arch.  Unchanged.  Nontender.      Left foot:  A single large 1.5 cm nodule overlying the central arch,  unchanged from prior examination, nontender.  New 0.5cm nodule          Wt Readings from Last 10 Encounters:   08/24/16 85.3 kg (188 lb)   04/07/16 85.3 kg (188 lb)   08/28/15 84.8 kg (187 lb)   04/18/14 82.6 kg (182 lb)   08/09/13 87.1 kg (192 lb)   04/16/13 85.7 kg (189 lb)   04/10/13 85.7 kg (189 lb)   02/22/13 85.3 kg (188 lb)   01/18/13 85.3 kg (188 lb)  Medications:              Encounter Medications:   . atorvastatin (LIPITOR) 10 MG tablet   . eszopiclone (LUNESTA) 2 MG tablet   . losartan (COZAAR) 50 MG tablet   . [DISCONTINUED] aspirin EC 81 MG EC tablet              Risk & Benefits of the new medication(s) were explained to the patient (and family) who verbalized understanding & agreed to the treatment plan. Patient (family) encouraged to contact me/clinical staff with any questions/concerns.     LABS AND RADIOLOGY:     CBC with Diff.  Lab Results   Component Value Date/Time    WBC 5.92 08/28/2015 10:00 AM    WBC NONE SEEN 02/22/2013 10:04 AM    HGB 16.1 08/28/2015 10:00 AM    HGB 15.7 08/09/2013 11:26 AM    HCT 49.5 08/28/2015 10:00 AM    PLT 235 08/28/2015 10:00 AM    PLT 223 08/09/2013 11:26 AM    MCV 99.2 08/28/2015 10:00 AM    RDW 14 08/28/2015 10:00 AM    NEUTRO 73 08/28/2015 10:00 AM    LYMPHOCYTESA 21 08/28/2015 10:00 AM    EOSINOPHILSA 1 08/28/2015 10:00 AM    IMMATUREGRAN 0 08/28/2015 10:00 AM    NEUTROABS 4.32 08/28/2015 10:00 AM    ABSOLUTEIMMA 0.01 08/28/2015 10:00 AM       Chemistries   Lab Results   Component Value Date/Time    NA 141 08/28/2015 10:00 AM    K 4.6 08/28/2015 10:00 AM     CL 106 08/28/2015 10:00 AM    CL 104 08/09/2013 11:26 AM    CO2 31 (H) 08/28/2015 10:00 AM    BUN 15.0 08/28/2015 10:00 AM    CREAT 0.8 08/28/2015 10:00 AM    CREAT 0.89 08/09/2013 11:26 AM    GLU 106 (H) 08/28/2015 10:00 AM    CA 10.0 08/28/2015 10:00 AM    BILITOTAL 1.8 (H) 08/28/2015 10:00 AM    AST 30 08/28/2015 10:00 AM    ALT 31 08/28/2015 10:00 AM    ALKPHOS 51 08/28/2015 10:00 AM             ASSESSMENT:     Tolerating treatment.    Plan:  1.Continue Radiation  2. F/u in 6 months  3. Increased contracture of the R Hand (outside of RT field) and one new nodule in L foot.    Frederick Muslim, MD  Department of Radiation Oncology  [facility]  [phone]

## 2017-05-06 ENCOUNTER — Encounter: Payer: Self-pay | Admitting: Radiation Oncology

## 2017-05-12 ENCOUNTER — Other Ambulatory Visit: Payer: Self-pay | Admitting: Radiation Oncology

## 2017-05-12 DIAGNOSIS — M720 Palmar fascial fibromatosis [Dupuytren]: Secondary | ICD-10-CM

## 2017-05-16 NOTE — Progress Notes (Signed)
Dear Dr Manuella Ghazi,    [name] recently completed a course of radiation therapy in the management of bilateral dupuytrens disease. His prior treatments include partial palmar fasciectomy of his bilateral hands by Dr Manuella Ghazi on 16-18May2018.  His radiation was delivered per the following guidelines.    TREATMENT DATES: 02/07/2017 - 02/11/2017 and 10/8/208 - 05/06/2017    TREATMENT AREA 1: Right Hand (Palmar Surface)  ENERGY: 6 MeV electrons.  FIELD ARRANGEMENT: En-Face Electrons  FIELD NUMBERS: 1  DOSE: 300 cGy per fraction x10 total fractions (split in 2, 5 fraction courses). Prescribed to 90% IDL.  TOTAL DOSE: 3000 cGy.    TREATMENT AREA2: Left Hand (Palmar Surface)  ENERGY: 6 MeV electrons.  FIELD ARRANGEMENT: En-Face Electrons  FIELD NUMBERS: 2  DOSE: 300 cGy per fraction x10 total fractions (split in 2, 5 fraction courses). Prescribed to 90% IDL.  TOTAL DOSE: 3000 cGy.    ASSESSMENT AND PLAN:  [name] tolerated his overall course of therapy well without complaints/concerns.    At the completion of therapy he was asked to see me back in 3 months.    Thank you for involving [place] in the care of this patient. Please call with any questions.  Case s/d/w Dr Tresea Mall who agrees with above.    Margrett Rud, MD  PGY-4, Radiation Oncology

## 2017-05-24 ENCOUNTER — Ambulatory Visit
Admission: RE | Admit: 2017-05-24 | Discharge: 2017-05-24 | Disposition: A | Discharge status: Home / Self Care | Payer: Medicare Other | Source: Ambulatory Visit | Attending: Radiation Oncology | Admitting: Radiation Oncology

## 2017-05-24 NOTE — OT Progress Note (Signed)
Occupational Therapy Note      Arrived for OT evaluation. Looking at patient's hands it was determined an OT assessment was not appropriate .       Luna Glasgow OTR/L # [phone]  [facility]

## 2017-06-10 ENCOUNTER — Telehealth (INDEPENDENT_AMBULATORY_CARE_PROVIDER_SITE_OTHER): Payer: Self-pay | Admitting: Internal Medicine

## 2017-06-10 NOTE — Telephone Encounter (Signed)
Patients wife would like a call back guidance on her husbands blood pressure.  Please call her at [phone].    Thank you!

## 2017-06-24 ENCOUNTER — Other Ambulatory Visit (INDEPENDENT_AMBULATORY_CARE_PROVIDER_SITE_OTHER): Payer: Self-pay | Admitting: Internal Medicine

## 2017-06-24 MED ORDER — ESZOPICLONE 2 MG PO TABS
2.0000 mg | ORAL_TABLET | Freq: Every evening | ORAL | 1 refills | Status: DC
Start: 2017-06-24 — End: 2017-12-22

## 2017-06-24 NOTE — Telephone Encounter (Signed)
Faxed lunesta to mail order pharmacy.

## 2017-06-24 NOTE — Telephone Encounter (Signed)
Name, strength, directions of requested refill(s):    eszopiclone (LUNESTA) 2 MG tablet  Pharmacy to send refill to or patient to pick up rx from office (mark requested pharmacy in BOLD):      [facility] - [place], [place] - [address] AT [facility]  [address] [place] [address]  Phone: [phone] Fax: [phone]    [id number] [place], [place] - [phone] [address] AT [facility]  [address] [place] [address]  Phone: [phone] Fax: [phone]    [id number] - [place], [place] - [address]  [address] [address]  Phone: [phone] Fax: [phone]        Please mark "X" next to the preferred call back number:    Mobile:   Telephone Information:   Mobile [phone]       Home: [phone]    Work:         PT IS OUT OF MEDICATION PLEASE SEND ASAP AND CALL PT TO INFORM IT WAS SENT.   Next visit: 07/05/2017

## 2017-07-05 ENCOUNTER — Ambulatory Visit (INDEPENDENT_AMBULATORY_CARE_PROVIDER_SITE_OTHER): Payer: Medicare Other | Admitting: Internal Medicine

## 2017-07-05 ENCOUNTER — Encounter (INDEPENDENT_AMBULATORY_CARE_PROVIDER_SITE_OTHER): Payer: Self-pay | Admitting: Internal Medicine

## 2017-07-05 VITALS — BP 217/108 | HR 89 | Temp 98.0°F | Ht 73.0 in | Wt 186.0 lb

## 2017-07-05 DIAGNOSIS — I159 Secondary hypertension, unspecified: Secondary | ICD-10-CM

## 2017-07-05 MED ORDER — CLONIDINE HCL 0.1 MG PO TABS
ORAL_TABLET | ORAL | 11 refills | Status: DC
Start: 2017-07-05 — End: 2019-12-06

## 2017-07-05 MED ORDER — LOSARTAN POTASSIUM 100 MG PO TABS
100.0000 mg | ORAL_TABLET | Freq: Every day | ORAL | 3 refills | Status: DC
Start: 2017-07-05 — End: 2018-08-10

## 2017-07-05 NOTE — Progress Notes (Signed)
Have you seen any specialists/other providers since your last visit with [place]?    No    Arm preference verified?   Yes    The patient is due for shingles vaccine and [facility], Medicare annual.

## 2017-07-05 NOTE — Progress Notes (Signed)
Chief Complaint   Patient presents with   . Medication Refill   . Hypertension       HPI    Frederick [name] is a 71 y.o. male here for evaluation and treatment of the following concerns  Pt has exreme whie cao hypertension. He has systolics of 130 to 150 range at home.     The patient's past medical history, surgical history, family history, medications and allergies were reviewed.    No Known Allergies    Medications  Current Outpatient Prescriptions   Medication Sig Dispense Refill   . atorvastatin (LIPITOR) 10 MG tablet Take 1 tablet (10 mg total) by mouth daily. 90 tablet 3   . eszopiclone (LUNESTA) 2 MG tablet Take 1 tablet (2 mg total) by mouth nightly.Take immediately before bedtime 90 tablet 1   . losartan (COZAAR) 50 MG tablet TAKE 1 TABLET DAILY 90 tablet 3   . cloNIDine (CATAPRES) 0.1 MG tablet One po  Prn for systolic over 180 30 tablet 11   . losartan (COZAAR) 100 MG tablet Take 1 tablet (100 mg total) by mouth daily. 90 tablet 3     No current facility-administered medications for this visit.        Review of Systems  CONST: No weight change, no fevers/chills/sweats, fatigue, muscle aches, appetite loss  EARS: No hearing loss, tinnitus, pain or discharge.   NOSE: No congestion, runny nose or bloody nose  NECK: No swollen glands, stiffness or pain  CV: No CP, palpitations, leg swelling, changes in exercise tolerance, PND, orthopnea  RESP: No SOB, cough, wheeze, asthma  GI: No n/v, diarrhea, constipation, abd pain, heartburn, blood in stool, early satiety  MSK:  No joint or muscle pain, swelling or weakness  SKIN:   No rashes lumps, sores, concerning moles, or changes in hair or nails.  HEME:  No bruising/bleeding, swollen glands  NEURO:  No HA, LH, dizziness, LOC, numbness/tingling, weakness  PSYCH:  No depressed mood, anhedonia, anxiety    Physical Exam:  BP (!) 217/108   Pulse 89   Temp 98 F (36.7 C) (Oral)   Ht 1.854 m (6\' 1" )   Wt 84.4 kg (186 lb)   BMI 24.54 kg/m   GEN: WD WNalert and  appropriate in NAD  HENT: NCAT, TMs normal bilaterally, no nasal congestion, OP normal w no exudate or erythema  EYES: PERRL, EOMI, no pallor or scleral icterus, normal conjunctiva  LYMPH: no cervical or supraclavicular LAD appreciated  CV: RRR, no m/r/g.  No LE edema.  2+ radial pulses present and equal.  RESP: CTAB, normal effort, no rhonchi or rales  GI: soft, nontender/ nondistended, NABS, no rebound or guarding  SKIN: warm, dry.  no visible rashes or concerning moles  MSK: Normal bulk and tone, normal strength 5/5 and sensation UE / LE  NEURO: No CN deficits noted (II-XII) PERRLA, EOMI, face symmetric, hearing intact to voice, palate raise, shoulder shrug and neck flexion intact, tongue midline. MAE.  PSYCH: normal mood and affect    Assessment:  1. Secondary hypertension  - losartan (COZAAR) 100 MG tablet; Take 1 tablet (100 mg total) by mouth daily.  Dispense: 90 tablet; Refill: 3  - cloNIDine (CATAPRES) 0.1 MG tablet; One po  Prn for systolic over 180  Dispense: 30 tablet; Refill: 11        Plan:  Will increase losartan to 100 and give clonidine for medical events.       Carlota Raspberry, M.D.

## 2017-07-26 ENCOUNTER — Ambulatory Visit: Payer: Medicare Other | Attending: Hand Surgery

## 2017-07-26 DIAGNOSIS — M720 Palmar fascial fibromatosis [Dupuytren]: Secondary | ICD-10-CM | POA: Insufficient documentation

## 2017-08-04 ENCOUNTER — Ambulatory Visit: Payer: Self-pay

## 2017-08-04 ENCOUNTER — Ambulatory Visit: Payer: Self-pay | Admitting: Radiation Oncology

## 2017-08-04 DIAGNOSIS — M720 Palmar fascial fibromatosis [Dupuytren]: Secondary | ICD-10-CM

## 2017-08-04 NOTE — Progress Notes (Signed)
08/04/17 1100   Radiation Therapy Patient Care - Skin    Site Hands, bilateral    Comfort Alteration   Karnofsky Performance Score 90%-can perform normal activity, minor signs of disease   Fatigue 1   Pain Score 0   Skin Alteration   Radiation Dermatitis 0-none   Tumor Necrosis N/A   Bleeding none   Infection none   Skin Care Aquaphor   Mucous Membrane Alteration   Drainage 0-absent   Drainage Odor 0-absent   Emotional Alteration   Coping 0-effective     Pt verbalized Left hand responding better to RT, than Right hand. PT informs that right had has some new " bumps" in the index, ring, and little fingers.  Pt also informs that he will experience some numbness in some fingertips when the weather is cold.    Grip Test    Pt is Right hand dominant          RIGHT HAND   LEFT HAND   75.9 69.2   77.8 67.9   74.4 65.0                   RADIATION ONCOLOGY FOLLOW-UP NOTE    Date Time:  11:16 AM 08/04/2017   Patient Name: Frederick [name]      SUBJECTIVE:   Frederick [name] is a 72 y.o. male who is seen in follow-up evaluation after completion of radiation therapy in the management of DD.  Mr Rosenfield recently completed a course of radiation therapy in the management of bilateral Dupuytrens disease. His prior treatments include partial palmar fasciectomy of his bilateral hands by Dr Manuella Ghazi on 16-18May2018.  His radiation was delivered per the following guidelines.    TREATMENT DATES: 02/07/2017 - 02/11/2017 and 10/8/208 - 05/06/2017    TREATMENT AREA 1: Right Hand (Palmar Surface)  ENERGY: 6 MeV electrons.  FIELD ARRANGEMENT: En-Face Electrons  FIELD NUMBERS: 1  DOSE: 300 cGy per fraction x10 total fractions (split in 2, 5 fraction courses). Prescribed to 90% IDL.  TOTAL DOSE: 3000 cGy.    TREATMENT AREA2: Left Hand (Palmar Surface)  ENERGY: 6 MeV electrons.  FIELD ARRANGEMENT: En-Face Electrons  FIELD NUMBERS: 2  DOSE: 300 cGy per fraction x10 total fractions (split in 2, 5 fraction courses). Prescribed to 90% IDL.  TOTAL  DOSE: 3000 cGy.    Pertinent subjective/objective findings are documented in the above flowsheet      Objective Findings:   There were no vitals taken for this visit.  EXTREMITIES:    Right hand:  Little finger: 55 degrees of contracture at the PIP joint, previously 90 degrees. Garrods pads.  Ring finger:  Multiple nodules overlying the area between the MCP and PIP 8 degrees of contracture previously 55 degrees.    Middle finger:  A 5-degree contracture previously 20 degrees at the MCP joint. Garrods pads.  Index finger:  Ten degrees of contracture at the DIP joint, previously 30 degrees, a firm nodule palpated in the MCP and PIP joint. Garrods pads.   Thumb: Cording leading to interphalangeal joint. Nodules palpated over MCP joint.    Intrathenar cording present.       Left hand:    Little finger: 90-degree contracture at the PIP joint, previously 90 degrees.    Ring finger:  10 degrees of contracture at the PIP joint, previously 75 degrees.  Nodularity palpated between the MCP and PIP joints.    Middle finger: 13 degree contracture at the PIP joint, previously 75  degrees.    Index finger:  Spared.  Thumb: Spared. Intrathenar cording present.       Right foot:  2 to 3 soft nodules over and the central arch.  Unchanged.  Nontender.      Left foot:  A single large 1.5 cm nodule overlying the central arch, new nodule distal to eminence of big toe.    No pain with erections       Wt Readings from Last 10 Encounters:   07/05/17 84.4 kg (186 lb)   08/24/16 85.3 kg (188 lb)   04/07/16 85.3 kg (188 lb)   08/28/15 84.8 kg (187 lb)   04/18/14 82.6 kg (182 lb)   08/09/13 87.1 kg (192 lb)   04/16/13 85.7 kg (189 lb)   04/10/13 85.7 kg (189 lb)   02/22/13 85.3 kg (188 lb)   01/18/13 85.3 kg (188 lb)     Last Weight:              Medications:       24 Hour Narcotic Requirement:  Gabapentin Dosing:    Encounter Medications:   . atorvastatin (LIPITOR) 10 MG tablet   . cloNIDine (CATAPRES) 0.1 MG tablet   . eszopiclone (LUNESTA) 2  MG tablet   . losartan (COZAAR) 100 MG tablet   . losartan (COZAAR) 50 MG tablet                LABS AND RADIOLOGY:     CBC with Diff.  Lab Results   Component Value Date/Time    WBC 5.92 08/28/2015 10:00 AM    WBC NONE SEEN 02/22/2013 10:04 AM    HGB 16.1 08/28/2015 10:00 AM    HGB 15.7 08/09/2013 11:26 AM    HCT 49.5 08/28/2015 10:00 AM    PLT 235 08/28/2015 10:00 AM    PLT 223 08/09/2013 11:26 AM    MCV 99.2 08/28/2015 10:00 AM    RDW 14 08/28/2015 10:00 AM    NEUTRO 73 08/28/2015 10:00 AM    LYMPHOCYTESA 21 08/28/2015 10:00 AM    EOSINOPHILSA 1 08/28/2015 10:00 AM    IMMATUREGRAN 0 08/28/2015 10:00 AM    NEUTROABS 4.32 08/28/2015 10:00 AM    ABSOLUTEIMMA 0.01 08/28/2015 10:00 AM       Chemistries   Lab Results   Component Value Date/Time    NA 141 08/28/2015 10:00 AM    K 4.6 08/28/2015 10:00 AM    CL 106 08/28/2015 10:00 AM    CL 104 08/09/2013 11:26 AM    CO2 31 (H) 08/28/2015 10:00 AM    BUN 15.0 08/28/2015 10:00 AM    CREAT 0.8 08/28/2015 10:00 AM    CREAT 0.89 08/09/2013 11:26 AM    GLU 106 (H) 08/28/2015 10:00 AM    CA 10.0 08/28/2015 10:00 AM    BILITOTAL 1.8 (H) 08/28/2015 10:00 AM    AST 30 08/28/2015 10:00 AM    ALT 31 08/28/2015 10:00 AM    ALKPHOS 51 08/28/2015 10:00 AM             ASSESSMENT:     Doing well post radiation therapy in the management of     Plan:  1.Continue close follow-up with other providers, Dr. Manuella Ghazi.  Disease in hands is relatively stable in terms of nodules, but has recontracture of some areas post surgery.  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  2. Progression of unirradiated L foot.  3. RTC in  6-12 months.            Arlee Muslim, MD  Chairman  Department of Radiation Oncology  [facility]  [facility]  Phone: [phone]  Fax: [phone]

## 2017-08-04 NOTE — Progress Notes (Addendum)
08/04/17 1100   Radiation Therapy Patient Care - Skin    Site Hands, bilateral    Comfort Alteration   Karnofsky Performance Score 90%-can perform normal activity, minor signs of disease   Fatigue 1   Pain Score 0   Skin Alteration   Radiation Dermatitis 0-none   Tumor Necrosis N/A   Bleeding none   Infection none   Skin Care Aquaphor   Mucous Membrane Alteration   Drainage 0-absent   Drainage Odor 0-absent   Emotional Alteration   Coping 0-effective     Pt verbalized Left hand responding better to RT, than Right hand. PT informs that right had has some new " bumps" in the index, ring, and little fingers.  Pt also informs that he will experience some numbness in some fingertips when the weather is cold.    Grip Test    Pt is Right hand dominant          RIGHT HAND   LEFT HAND   75.9 69.2   77.8 67.9   74.4 65.0

## 2017-11-07 ENCOUNTER — Other Ambulatory Visit (INDEPENDENT_AMBULATORY_CARE_PROVIDER_SITE_OTHER): Payer: Self-pay | Admitting: Internal Medicine

## 2017-12-19 ENCOUNTER — Other Ambulatory Visit (INDEPENDENT_AMBULATORY_CARE_PROVIDER_SITE_OTHER): Payer: Self-pay | Admitting: Internal Medicine

## 2017-12-22 ENCOUNTER — Other Ambulatory Visit (INDEPENDENT_AMBULATORY_CARE_PROVIDER_SITE_OTHER): Payer: Self-pay | Admitting: Internal Medicine

## 2017-12-22 MED ORDER — ESZOPICLONE 2 MG PO TABS
2.0000 mg | ORAL_TABLET | Freq: Every evening | ORAL | 1 refills | Status: DC
Start: 2017-12-22 — End: 2017-12-23

## 2017-12-23 ENCOUNTER — Other Ambulatory Visit (INDEPENDENT_AMBULATORY_CARE_PROVIDER_SITE_OTHER): Payer: Self-pay | Admitting: Internal Medicine

## 2017-12-23 ENCOUNTER — Telehealth (INDEPENDENT_AMBULATORY_CARE_PROVIDER_SITE_OTHER): Payer: Self-pay | Admitting: Internal Medicine

## 2017-12-23 NOTE — Telephone Encounter (Signed)
Patient needs PA done for eszopiclone (LUNESTA) 2 MG tablet.  He is down to 2 pills left and needs this asap

## 2017-12-23 NOTE — Telephone Encounter (Signed)
Pre authorization for Lunesta 2 mg was approve for one year, Patient aware and [facility] local pharmacy notified as per patient request.

## 2018-01-17 ENCOUNTER — Encounter (INDEPENDENT_AMBULATORY_CARE_PROVIDER_SITE_OTHER): Payer: Self-pay

## 2018-01-23 ENCOUNTER — Ambulatory Visit
Admission: RE | Admit: 2018-01-23 | Discharge: 2018-01-23 | Disposition: A | Discharge status: Home / Self Care | Payer: Medicare Other | Source: Ambulatory Visit | Attending: Hand Surgery | Admitting: Hand Surgery

## 2018-01-23 DIAGNOSIS — M720 Palmar fascial fibromatosis [Dupuytren]: Secondary | ICD-10-CM | POA: Insufficient documentation

## 2018-02-20 ENCOUNTER — Ambulatory Visit: Payer: Self-pay | Admitting: Radiation Oncology

## 2018-02-20 DIAGNOSIS — M720 Palmar fascial fibromatosis [Dupuytren]: Secondary | ICD-10-CM

## 2018-02-20 NOTE — Progress Notes (Signed)
02/20/18 1122   Radiation Therapy Patient Care - Skin    Site hands, bilaterral   Comfort Alteration   Karnofsky Performance Score 90%-can perform normal activity, minor signs of disease   Fatigue 1   Pain Score 0   Skin Alteration   Radiation Dermatitis 0-none   Tumor Necrosis N/A   Bleeding none   Infection none   Skin Care Aquaphor   Mucous Membrane Alteration   Drainage 0-absent   Drainage Odor 0-absent   Emotional Alteration   Coping 0-effective   Vital signs   Temp 98 F (36.7 C)   Temp Source Oral   Heart Rate 77   Resp Rate 14   BP (!) 220/136       Grip Test    Pt is right hand dominant          RIGHT HAND   LEFT HAND   78.9 79.0   85.7 78.7   79.2 72.7       BP retaken @ 11:29h result 184/107.      Karis Juba RN,BSN  02/20/2018  11:27h

## 2018-02-20 NOTE — Progress Notes (Signed)
02/20/18 1122   Radiation Therapy Patient Care - Skin    Site hands, bilaterral   Comfort Alteration   Karnofsky Performance Score 90%-can perform normal activity, minor signs of disease   Fatigue 1   Pain Score 0   Skin Alteration   Radiation Dermatitis 0-none   Tumor Necrosis N/A   Bleeding none   Infection none   Skin Care Aquaphor   Mucous Membrane Alteration   Drainage 0-absent   Drainage Odor 0-absent   Emotional Alteration   Coping 0-effective   Vital signs   Temp 98 F (36.7 C)   Temp Source Oral   Heart Rate 77   Resp Rate 14   BP (!) 220/136       Grip Test    Pt is right hand dominant          RIGHT HAND   LEFT HAND   78.9 79.0   85.7 78.7   79.2 72.7       BP retaken @ 11:29h result 184/107.      Karis Juba RN,BSN  02/20/2018       11:27h                          RADIATION ONCOLOGY FOLLOW-UP NOTE    Date Time:  11:42 AM 02/20/2018   Patient Name: Frederick [name]      SUBJECTIVE:   Frederick [name] is a 72 y.o. male who is seen in follow-up evaluation after completion of radiation therapy in the management of DD.  Frederick [name] recently completed a course of radiation therapy in the management of bilateral Dupuytrens disease. His prior treatments include partial palmar fasciectomy of his bilateral hands by Dr Manuella Ghazi on 16-18May2018. His radiation was delivered per the following guidelines.    TREATMENT DATES: 02/07/2017 - 02/11/2017 and 10/8/208 - 05/06/2017    TREATMENT AREA 1: Right Hand (Palmar Surface)  ENERGY: 6 MeV electrons.  FIELD ARRANGEMENT: En-Face Electrons  FIELD NUMBERS: 1  DOSE: 300 cGy per fraction x10 total fractions (split in 2, 5 fraction courses). Prescribed to 90% IDL.  TOTAL DOSE: 3000 cGy.    TREATMENT AREA2: Left Hand (Palmar Surface)  ENERGY: 6 MeV electrons.  FIELD ARRANGEMENT: En-Face Electrons  FIELD NUMBERS: 2  DOSE: 300 cGy per fraction x10 total fractions (split in 2, 5 fraction courses). Prescribed to 90% IDL.  TOTAL DOSE: 3000 cGy.    Had B/L  aponeurotomy by Dr. Manuella Ghazi in 12/2016.     He feels like his left hand has had been stable over the past 6 months. Grip strength above has increased in L hand.    He notes in the R hand, he has noticed a new nodule along his R index finger and increased contracture in his R pinky finger.         Objective Findings:   BP (!) 220/136   Pulse 77   Temp 98 F (36.7 C) (Oral)   Resp 14     EXTREMITIES:   Right hand:  Little finger: 55 degrees of contracture at the PIP joint,  Now 90-degree contracture. Garrods pads.  Ring finger: Multiple nodules overlying the area between the MCP and PIP 8 degrees of contracture previously, now 60 degree contracture at MCP, increased. New nodularity between PIP and DIP joints.  Middle finger: A 5-degree contracture previously, 20 degrees now at the MCP joint. Garrods pads.  Index finger: 40 degrees of contracture at the DIP  joint, previously 10 degrees, a firm nodule palpated in the MCP and PIP joint. Garrods pads.  Thumb: Cording leading tointerphalangeal joint. Nodules palpated over MCP joint.   Intrathenar cording present.     Left hand: LARGELY UNCHANGED  Little finger: 90-degree contracture at the PIP joint.   Ring finger: 10 degrees of contracture at the PIP joint. Nodularity palpated between the MCP and PIP joints. Middle finger: 13 degree contracture at the PIP joint.   Index finger: Spared.  Thumb: Spared. Interthenar cording present.     Right foot: 3 coalesced soft nodules over and the central arch. Unchanged.     Left foot: A single large 1.5 cm nodule overlying the central arch, nodule distal to eminence of big toe, unchanged.    No pain with erections     Wt Readings from Last 10 Encounters:   07/05/17 84.4 kg (186 lb)   08/24/16 85.3 kg (188 lb)   04/07/16 85.3 kg (188 lb)   08/28/15 84.8 kg (187 lb)   04/18/14 82.6 kg (182 lb)   08/09/13 87.1 kg (192 lb)   04/16/13 85.7 kg (189 lb)   04/10/13 85.7 kg (189 lb)   02/22/13 85.3 kg (188 lb)   01/18/13  85.3 kg (188 lb)            Medications:       24 Hour Narcotic Requirement: None  Gabapentin Dosing: None    Encounter Medications:   . atorvastatin (LIPITOR) 10 MG tablet   . cloNIDine (CATAPRES) 0.1 MG tablet   . eszopiclone (LUNESTA) 2 MG tablet   . losartan (COZAAR) 100 MG tablet   . losartan (COZAAR) 50 MG tablet                LABS AND RADIOLOGY:     CBC with Diff.  Lab Results   Component Value Date/Time    WBC 5.92 08/28/2015 10:00 AM    WBC NONE SEEN 02/22/2013 10:04 AM    HGB 16.1 08/28/2015 10:00 AM    HGB 15.7 08/09/2013 11:26 AM    HCT 49.5 08/28/2015 10:00 AM    PLT 235 08/28/2015 10:00 AM    PLT 223 08/09/2013 11:26 AM    MCV 99.2 08/28/2015 10:00 AM    RDW 14 08/28/2015 10:00 AM    NEUTRO 73 08/28/2015 10:00 AM    LYMPHOCYTESA 21 08/28/2015 10:00 AM    EOSINOPHILSA 1 08/28/2015 10:00 AM    IMMATUREGRAN 0 08/28/2015 10:00 AM    NEUTROABS 4.32 08/28/2015 10:00 AM    ABSOLUTEIMMA 0.01 08/28/2015 10:00 AM       Chemistries   Lab Results   Component Value Date/Time    NA 141 08/28/2015 10:00 AM    K 4.6 08/28/2015 10:00 AM    CL 106 08/28/2015 10:00 AM    CL 104 08/09/2013 11:26 AM    CO2 31 (H) 08/28/2015 10:00 AM    BUN 15.0 08/28/2015 10:00 AM    CREAT 0.8 08/28/2015 10:00 AM    CREAT 0.89 08/09/2013 11:26 AM    GLU 106 (H) 08/28/2015 10:00 AM    CA 10.0 08/28/2015 10:00 AM    BILITOTAL 1.8 (H) 08/28/2015 10:00 AM    AST 30 08/28/2015 10:00 AM    ALT 31 08/28/2015 10:00 AM    ALKPHOS 51 08/28/2015 10:00 AM             ASSESSMENT:   Doing well post radiation therapy in the management of his B/L  Dupuytren's disease s/p 30 Gy/10 fx. Seems to have progression in his right hand with increased contracture and new nodules. We discussed at last visit that he will likely  progress given Tubiana stage II disease. We encouraged continued follow-up with Dr. Manuella Ghazi.     Plan:  1.Continue close follow-up with other providers, Dr. Manuella Ghazi.  Disease in L hand is relatively stable, but progressed in the R hand in terms of  nodules and recontracture.  See Dr. Manuella Ghazi to discuss other options for R Hand  2. No progression of unirradiated B/L feet.  3. RTC in 12 months.          Frederick Brick, MD         ATTENDING ADDENDUM:    I agree with the assessment and plan as outlined by the resident physician.  I was present for visit and verified and performed a history and physical exam.  We discussed the rationale, risks, alternative, benefits and potential short and long term toxicites of radiation therapy in detail.  We reviewed the simulation and treatment planning process as well as the need for referral to other appropriate services/providers for facilitative or supportive measures during or after the course of therapy.      CCC: 30/35min           Frederick Barnier K. Tresea Mall, MD, MBA  [facility]  Chairman and [facility]  [address]  [place], [place] [address]    T [phone] F [phone]  Executive Assistant: Debbe Odea  T [phone] E [email or web address]

## 2018-06-14 ENCOUNTER — Encounter (INDEPENDENT_AMBULATORY_CARE_PROVIDER_SITE_OTHER): Payer: Self-pay

## 2018-07-12 ENCOUNTER — Encounter (INDEPENDENT_AMBULATORY_CARE_PROVIDER_SITE_OTHER): Payer: Self-pay

## 2018-08-02 ENCOUNTER — Encounter (INDEPENDENT_AMBULATORY_CARE_PROVIDER_SITE_OTHER): Payer: Self-pay

## 2018-08-03 ENCOUNTER — Other Ambulatory Visit (INDEPENDENT_AMBULATORY_CARE_PROVIDER_SITE_OTHER): Payer: Self-pay | Admitting: Internal Medicine

## 2018-08-03 DIAGNOSIS — I159 Secondary hypertension, unspecified: Secondary | ICD-10-CM

## 2018-08-10 ENCOUNTER — Telehealth (INDEPENDENT_AMBULATORY_CARE_PROVIDER_SITE_OTHER): Payer: Self-pay | Admitting: Internal Medicine

## 2018-08-10 DIAGNOSIS — I159 Secondary hypertension, unspecified: Secondary | ICD-10-CM

## 2018-08-10 MED ORDER — LOSARTAN POTASSIUM 100 MG PO TABS
100.0000 mg | ORAL_TABLET | Freq: Every day | ORAL | 3 refills | Status: DC
Start: 2018-08-10 — End: 2019-03-13

## 2018-08-10 MED ORDER — LOSARTAN POTASSIUM 100 MG PO TABS
100.0000 mg | ORAL_TABLET | Freq: Every day | ORAL | 3 refills | Status: DC
Start: 2018-08-10 — End: 2018-08-10

## 2018-08-10 NOTE — Telephone Encounter (Signed)
Called to inform losartan has beet to mail order and 7 days supply to local pharmacy. Left a voice message.

## 2018-08-10 NOTE — Telephone Encounter (Signed)
Pt calling requesting refills for   losartan (COZAAR) 100 MG tablet  Pt states he is down to only 4 tablets left. [facility]

## 2018-08-18 ENCOUNTER — Encounter (INDEPENDENT_AMBULATORY_CARE_PROVIDER_SITE_OTHER): Payer: Self-pay | Admitting: Internal Medicine

## 2018-08-18 ENCOUNTER — Ambulatory Visit (INDEPENDENT_AMBULATORY_CARE_PROVIDER_SITE_OTHER): Payer: Medicare Other | Admitting: Internal Medicine

## 2018-08-18 VITALS — BP 225/107 | HR 60 | Temp 97.6°F | Ht 73.0 in | Wt 194.0 lb

## 2018-08-18 DIAGNOSIS — R351 Nocturia: Secondary | ICD-10-CM

## 2018-08-18 DIAGNOSIS — M545 Low back pain, unspecified: Secondary | ICD-10-CM

## 2018-08-18 DIAGNOSIS — G8929 Other chronic pain: Secondary | ICD-10-CM

## 2018-08-18 DIAGNOSIS — I10 Essential (primary) hypertension: Secondary | ICD-10-CM

## 2018-08-18 DIAGNOSIS — G479 Sleep disorder, unspecified: Secondary | ICD-10-CM

## 2018-08-18 DIAGNOSIS — Z1159 Encounter for screening for other viral diseases: Secondary | ICD-10-CM

## 2018-08-18 DIAGNOSIS — N401 Enlarged prostate with lower urinary tract symptoms: Secondary | ICD-10-CM

## 2018-08-18 LAB — COMPREHENSIVE METABOLIC PANEL
ALT: 25 U/L (ref 0–55)
AST (SGOT): 26 U/L (ref 5–34)
Albumin/Globulin Ratio: 1.3 (ref 0.9–2.2)
Albumin: 4.4 g/dL (ref 3.5–5.0)
Alkaline Phosphatase: 52 U/L (ref 38–106)
BUN: 17 mg/dL (ref 9.0–28.0)
Bilirubin, Total: 2.6 mg/dL — ABNORMAL HIGH (ref 0.2–1.2)
CO2: 24 mEq/L (ref 21–29)
Calcium: 10 mg/dL (ref 7.9–10.2)
Chloride: 103 mEq/L (ref 100–111)
Creatinine: 0.9 mg/dL (ref 0.5–1.5)
Globulin: 3.5 g/dL (ref 2.0–3.7)
Glucose: 96 mg/dL (ref 70–100)
Potassium: 3.7 mEq/L (ref 3.5–5.1)
Protein, Total: 7.9 g/dL (ref 6.0–8.3)
Sodium: 141 mEq/L (ref 136–145)

## 2018-08-18 LAB — URINALYSIS
Bilirubin, UA: NEGATIVE
Blood, UA: NEGATIVE
Glucose, UA: NEGATIVE
Ketones UA: 40 — AB
Leukocyte Esterase, UA: NEGATIVE
Nitrite, UA: NEGATIVE
Protein, UR: 100 — AB
Specific Gravity UA: 1.023 (ref 1.001–1.035)
Urine pH: 5.5 (ref 5.0–8.0)
Urobilinogen, UA: 0.2 (ref 0.2–2.0)

## 2018-08-18 LAB — CBC AND DIFFERENTIAL
Absolute NRBC: 0 10*3/uL (ref 0.00–0.00)
Basophils Absolute Automated: 0.02 10*3/uL (ref 0.00–0.08)
Basophils Automated: 0.4 %
Eosinophils Absolute Automated: 0.03 10*3/uL (ref 0.00–0.44)
Eosinophils Automated: 0.5 %
Hematocrit: 51.4 % — ABNORMAL HIGH (ref 37.6–49.6)
Hgb: 16.4 g/dL (ref 12.5–17.1)
Immature Granulocytes Absolute: 0.02 10*3/uL (ref 0.00–0.07)
Immature Granulocytes: 0.4 %
Lymphocytes Absolute Automated: 0.99 10*3/uL (ref 0.42–3.22)
Lymphocytes Automated: 17.9 %
MCH: 30.8 pg (ref 25.1–33.5)
MCHC: 31.9 g/dL (ref 31.5–35.8)
MCV: 96.6 fL — ABNORMAL HIGH (ref 78.0–96.0)
MPV: 9.9 fL (ref 8.9–12.5)
Monocytes Absolute Automated: 0.36 10*3/uL (ref 0.21–0.85)
Monocytes: 6.5 %
Neutrophils Absolute: 4.12 10*3/uL (ref 1.10–6.33)
Neutrophils: 74.3 %
Nucleated RBC: 0 /100 WBC (ref 0.0–0.0)
Platelets: 262 10*3/uL (ref 142–346)
RBC: 5.32 10*6/uL (ref 4.20–5.90)
RDW: 13 % (ref 11–15)
WBC: 5.54 10*3/uL (ref 3.10–9.50)

## 2018-08-18 LAB — GFR: EGFR: >60

## 2018-08-18 LAB — URINE MICROSCOPIC: Urine Bacteria: NONE SEEN /hpf

## 2018-08-18 LAB — LIPID PANEL
Cholesterol / HDL Ratio: 2.6
Cholesterol: 176 mg/dL (ref 0–199)
HDL: 68 mg/dL (ref 40–9999)
LDL Calculated: 92 mg/dL (ref 0–99)
Triglycerides: 80 mg/dL (ref 34–149)
VLDL Calculated: 16 mg/dL (ref 10–40)

## 2018-08-18 LAB — PSA: Prostate Specific Antigen, Total: 1.271 ng/mL (ref 0.000–4.000)

## 2018-08-18 LAB — HEMOLYSIS INDEX: Hemolysis Index: 50 — ABNORMAL HIGH (ref 0–18)

## 2018-08-18 LAB — HEPATITIS C ANTIBODY: Hepatitis C, AB: NONREACTIVE

## 2018-08-18 MED ORDER — ESZOPICLONE 2 MG PO TABS
ORAL_TABLET | ORAL | 1 refills | Status: DC
Start: 2018-08-18 — End: 2018-12-13

## 2018-08-18 NOTE — Progress Notes (Signed)
Frederick [name] is a 73 y.o. male who presents today for a Medicare Annual Wellness Visit.   Exercise 4 /week.   No tobacco  Sleep  Tinnitus  Sleep 4 to 6 hours.   ENT    Morning stiffness  Health Risk Assessment     During the past month, how would you rate your general health?: Good Excellent  Which of the following tasks can you do without assistance - drive or take the bus alone; shop for groceries or clothes; prepare your own meals; do your own housework/laundry; handle your own finances/pay bills; eat, bathe or get around your home?: All Drive or take the bus alone, Shop for groceries or clothes, Prepare your own meals, Eat, bathe, dress or get around your home, Handle your own finances/pay bills, Do your own housework/laundry  Which of the following problems have you been bothered by in the past month - dizzy when standing up; problems using the phone; feeling tired or fatigued; moderate or severe body pain?:   None of these None  Do you exercise for about 20 minutes 3 or more days per week?: yes Yes  During the past month was someone available to help if you needed and wanted help?  For example, if you felt nervous, lonely, got sick and had to stay in bed, needed someone to talk to, needed help with daily chores or needed help just taking care of yourself.: Yes Yes  Do you always wear a seat belt?:  Yes Yes  Do you have any trouble taking medications the way you have been told to take them?:  Yes No  Have you been given any information that can help you with keeping track of your medications?:  Yes None needed  Do you have trouble paying for your medications?:  No No  Have you been given any information that can help you with hazards in your house, such as scatter rugs, furniture, etc?:  No None needed  Do you feel unsteady when standing or walking?:  No No  Do you worry about falling?:  No No  Have you fallen two or more times in the past year?:  No No  Did you suffer any injuries from your falls in the  past year?:  No No    Patient Care Team:  Cassell Clement, MD as PCP - General (Internal Medicine)    Past Medical History:   Diagnosis Date    Diverticulitis     Hearing loss     wears hearing aids, about 20% bilaterally     Hyperacusis of both ears     participating in audiotherapy     Hyperlipidemia     controlled on meds    Hypertension     home readings range from 120s/70s to 160s; per pt has white coat syndrome (BP at 08/28/15 PMD visit 167/94)    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    Tinnitus      Past Surgical History:   Procedure Laterality Date    APPENDECTOMY  1973    COLONOSCOPY  02/2011    COLONOSCOPY N/A 08/24/2016    Procedure: COLONOSCOPY;  Surgeon: Lestine Mount, MD;  Location: [facility];  Service: Gastroenterology;  Laterality: N/A;  COLONOSCOPY    EXCISION, CYST  12/2006    back    KNEE ARTHROSCOPY Left 09/2011    RELEASE, 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    RELEASE, 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    ROTATOR CUFF REPAIR Left  09/2000    and shaving of bone spur     No Known Allergies   Current Outpatient Medications   Medication Sig Dispense Refill    atorvastatin (LIPITOR) 10 MG tablet TAKE 1 TABLET DAILY 90 tablet 3    eszopiclone (LUNESTA) 2 MG tablet TAKE 1 TABLET NIGHTLY      IMMEDIATELY BEFORE BEDTIME 90 tablet 1    losartan (COZAAR) 100 MG tablet Take 1 tablet (100 mg total) by mouth daily 7 tablet 3    cloNIDine (CATAPRES) 0.1 MG tablet One po  Prn for systolic over 180 30 tablet 11     No current facility-administered medications for this visit.       Social History     Tobacco Use    Smoking status: Never Smoker    Smokeless tobacco: Never Used   Substance Use Topics    Alcohol use: Yes     Alcohol/week: 14.0 standard drinks     Types: 14 Glasses of wine per week    Drug use: No      History reviewed. No pertinent family history.     Physical Exam:  BP (!) 225/107 (BP Site: Left arm, Patient Position: Sitting, Cuff Size: Medium)    Pulse 60    Temp 97.6 F  (36.4 C) (Oral)    Ht 1.854 m (6\' 1" )    Wt 88 kg (194 lb)    SpO2 99%    BMI 25.60 kg/m   Wt Readings from Last 3 Encounters:   08/18/18 88 kg (194 lb)   07/05/17 84.4 kg (186 lb)   08/24/16 85.3 kg (188 lb)     GEN: WD WNalert and appropriate in NAD  HENT: NCAT, TMs normal bilaterally, no nasal congestion, OP normal w no exudate or erythema  EYES: PERRL, EOMI, no pallor or scleral icterus, normal conjunctiva  NECK: supple, no thyromegaly or nodules appreciated  LYMPH: no cervical or supraclavicular LAD appreciated  CV: RRR, no m/r/g.  No LE edema.  2+ radial pulses present and equal.  RESP: CTAB, normal effort, no rhonchi or rales  GI: soft, nontender/ nondistended, NABS, no rebound or guarding  SKIN: warm, dry.  no visible rashes or concerning moles  MSK: Normal bulk and tone, normal strength 5/5 and sensation UE / LE  NEURO: No CN deficits noted (II-XII) PERRLA, EOMI, face symmetric, hearing intact to voice, palate raise, shoulder shrug and neck flexion intact, tongue midline. MAE.  PSYCH: normal mood and affect        Depression Screening  negative  See related Activity or Flowsheet    Functional Ability/Level of Safety    1. Was the patient's timed Up & Go test unsteady or longer than 30 seconds? No    2. Does the patient need help with the phone, transportation, shopping, preparing meals, housework, laundry, medications or managing money?  No    3. Does the patient's home have rugs in the hallway, lack grab bars in the bathroom, lack handrails on the stairs or have poor lighting?  No    4. Have you noticed any hearing difficulties?  Uses hearing aids has tinnitus.      Assessment    BP (!) 225/107 (BP Site: Left arm, Patient Position: Sitting, Cuff Size: Medium)    Pulse 60    Temp 97.6 F (36.4 C) (Oral)    Ht 1.854 m (6\' 1" )    Wt 88 kg (194 lb)    SpO2 99%  BMI 25.60 kg/m      Vision Screening (required for IPPE only): Patient states eye exam performed elsewhere within past 12 months  Screening EKG  (IPPE only): normal EKG, normal sinus rhythm, unchanged from previous tracings    Evaluation of Cognitive Function    Mood/affect: Appropriate  Appearance: neatly groomed, appropriately and adequately nourished  Family member/caregiver input: Not present    Counseling and Referral of Preventive Services    Cardiovascular Disease Screening Tests  Colorectal Cancer Screening  Diabetes Screening    List any risk factors and conditions for which interventions are recommended or underway (based on history, exam and screening): None.    Appropriate personalized health advice furnished: Community-based lifestyle interventions to reduce health risks and promote self-management and wellness    Discussion of Advance Directives: Has an Advanced Directive. A copy has not been provided. Requested to provide.      1. Essential hypertension  CBC and differential    Lipid panel    Urinalysis    Comprehensive metabolic panel    PSA   2. Benign prostatic hyperplasia with nocturia  PSA   3. Chronic bilateral low back pain without sciatica  XR Lumbar Spine AP And Lateral    Sports Medicine Referral: Tandy Gaw, MD [facility])   4. Encounter for hepatitis C screening test for low risk patient  Hepatitis C (HCV) antibody, Total   5. Sleep disturbance  eszopiclone (LUNESTA) 2 MG tablet        Cassell Clement, MD

## 2018-08-18 NOTE — Progress Notes (Signed)
Have you seen any specialists/other providers since your last visit with us?    No    Arm preference verified?   Yes    The patient is due for Hep C Screening

## 2018-08-22 ENCOUNTER — Other Ambulatory Visit: Payer: Self-pay | Admitting: Internal Medicine

## 2018-08-24 ENCOUNTER — Telehealth (INDEPENDENT_AMBULATORY_CARE_PROVIDER_SITE_OTHER): Payer: Self-pay | Admitting: Internal Medicine

## 2018-08-24 ENCOUNTER — Other Ambulatory Visit (INDEPENDENT_AMBULATORY_CARE_PROVIDER_SITE_OTHER): Payer: Self-pay | Admitting: Internal Medicine

## 2018-08-24 DIAGNOSIS — G8929 Other chronic pain: Secondary | ICD-10-CM

## 2018-08-24 DIAGNOSIS — M545 Low back pain, unspecified: Secondary | ICD-10-CM

## 2018-08-24 NOTE — Telephone Encounter (Signed)
Pt called in stating he spoke with orthopaedic surgeon Dr. Blinda Leatherwood office about his referral but was informed they do not do anything for Chronic bilateral low back pain without sciatica.    Pt asking if Dr. Lilian Kapur can refer him to another orthopaedic surgeon and receive a new referral.

## 2018-09-04 ENCOUNTER — Encounter (INDEPENDENT_AMBULATORY_CARE_PROVIDER_SITE_OTHER): Payer: Self-pay | Admitting: Internal Medicine

## 2018-09-05 ENCOUNTER — Encounter (INDEPENDENT_AMBULATORY_CARE_PROVIDER_SITE_OTHER): Payer: Self-pay | Admitting: Internal Medicine

## 2018-09-06 ENCOUNTER — Encounter (INDEPENDENT_AMBULATORY_CARE_PROVIDER_SITE_OTHER): Payer: Self-pay

## 2018-09-15 ENCOUNTER — Encounter (INDEPENDENT_AMBULATORY_CARE_PROVIDER_SITE_OTHER): Payer: Self-pay

## 2018-11-09 ENCOUNTER — Other Ambulatory Visit (INDEPENDENT_AMBULATORY_CARE_PROVIDER_SITE_OTHER): Payer: Self-pay | Admitting: Internal Medicine

## 2018-12-01 ENCOUNTER — Encounter (INDEPENDENT_AMBULATORY_CARE_PROVIDER_SITE_OTHER): Payer: Self-pay

## 2018-12-13 ENCOUNTER — Other Ambulatory Visit (INDEPENDENT_AMBULATORY_CARE_PROVIDER_SITE_OTHER): Payer: Self-pay

## 2018-12-13 ENCOUNTER — Encounter (INDEPENDENT_AMBULATORY_CARE_PROVIDER_SITE_OTHER): Payer: Self-pay

## 2018-12-13 DIAGNOSIS — G479 Sleep disorder, unspecified: Secondary | ICD-10-CM

## 2018-12-13 MED ORDER — ESZOPICLONE 2 MG PO TABS
ORAL_TABLET | ORAL | 1 refills | Status: DC
Start: 2018-12-13 — End: 2019-02-21

## 2018-12-13 NOTE — Telephone Encounter (Signed)
Dr. Lilian Kapur, We received A [facility] From [facility] saying that the Medication ESZOPLICONE TAB 2MG ,  IS Backordered And To Please provide A New RX Via scribe for an alternative Medication.     Thanks  [employer].

## 2018-12-19 ENCOUNTER — Other Ambulatory Visit (INDEPENDENT_AMBULATORY_CARE_PROVIDER_SITE_OTHER): Payer: Self-pay

## 2019-01-01 ENCOUNTER — Encounter (INDEPENDENT_AMBULATORY_CARE_PROVIDER_SITE_OTHER): Payer: Self-pay

## 2019-01-29 ENCOUNTER — Encounter: Payer: Medicare Other | Admitting: Internal Medicine

## 2019-01-29 NOTE — Progress Notes (Signed)
Frederick [name] is a 73 y.o. male who is here today for an informal "meet the doctor" visit to see if there is mutual interest on behalf of the patient and provider in joining this practice model. I discussed the rationale of concierge medicine, our policies, my experience and background and our primary care Internal Medicine focus. He will decide based on his needs and expectations, but if the decision is to join, then he will be scheduled for an appointment to discuss his specific clinical history and medical concerns.    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  [place], [place] [address]  P) [phone]  F) [phone]  [email or web address]

## 2019-01-30 ENCOUNTER — Telehealth (INDEPENDENT_AMBULATORY_CARE_PROVIDER_SITE_OTHER): Payer: Self-pay

## 2019-01-30 ENCOUNTER — Encounter (INDEPENDENT_AMBULATORY_CARE_PROVIDER_SITE_OTHER): Payer: Self-pay

## 2019-01-30 NOTE — Telephone Encounter (Signed)
[employer] PA request form received for prescribed Lunesta. Form completed and faxed back to [facility] FEP at [phone].

## 2019-01-31 ENCOUNTER — Encounter (INDEPENDENT_AMBULATORY_CARE_PROVIDER_SITE_OTHER): Payer: Self-pay

## 2019-02-20 ENCOUNTER — Ambulatory Visit (INDEPENDENT_AMBULATORY_CARE_PROVIDER_SITE_OTHER): Payer: Medicare Other | Admitting: Internal Medicine

## 2019-02-20 ENCOUNTER — Encounter: Payer: Self-pay | Admitting: Internal Medicine

## 2019-02-20 VITALS — BP 190/116 | HR 76 | Temp 97.9°F | Resp 18 | Ht 73.0 in | Wt 195.2 lb

## 2019-02-20 DIAGNOSIS — M720 Palmar fascial fibromatosis [Dupuytren]: Secondary | ICD-10-CM

## 2019-02-20 DIAGNOSIS — M4716 Other spondylosis with myelopathy, lumbar region: Secondary | ICD-10-CM

## 2019-02-20 DIAGNOSIS — H93233 Hyperacusis, bilateral: Secondary | ICD-10-CM

## 2019-02-20 DIAGNOSIS — I10 Essential (primary) hypertension: Secondary | ICD-10-CM

## 2019-02-20 DIAGNOSIS — Z0000 Encounter for general adult medical examination without abnormal findings: Secondary | ICD-10-CM

## 2019-02-20 NOTE — Progress Notes (Signed)
Chief Complaint   Patient presents with    Initial Visit    Blood pressure    Medication Refill     Losartan    Spinal Pain     x-ray     Dupatryns fingers    Tinnitus       HPI    Frederick [name] is a 73 y.o. male here for evaluation and treatment of the following concerns.     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  House to the [employer] [address]    He used to weigh 175 he gained  222 lbs then down to  185 lbs and currently 195 lbs he would like to maintain under 200lbs    He has labile BP and manages with daily Losartan 100 mg  Clonidine 0.1 mg for SBP > 180 he got the script late last year he has used 2 times  Tinnitus he has hearing aids that are allowing for the tinnitus to be dulled out  1993 started with a traumatic event at a concert and tinnitus started in  2008 he was at a party  And again increased the tonality loss and then in 2017 - stepped on a deck and a skill saw was a sharp frequency  Back Pain referral to Dr. Lyndel Safe  Dupytruens contraction L4-L5 bone spur developed with loss of height 1 inch  Dr Chrisandra Netters -[place], he needs to be seen 2 years ago    [place]  Lipitor 10 mg   Active Problems  Patient Active Problem List    Diagnosis Date Noted    ERRONEOUS ENCOUNTER--DISREGARD 05/05/2017    Dupuytren's contracture of left hand 08/09/2013    Preop examination  08/09/2013    Labile blood pressure 02/22/2013    Dupuytren's contracture of both hands 02/22/2013    Hyperacusis of both ears 02/22/2013    Hyperlipidemia 01/18/2013    Insomnia 01/18/2013    Tinnitus 01/18/2013       The patient's past medical history, surgical history, medications and allergies were reviewed.    No Known Allergies    Medications  Current Outpatient Medications   Medication Sig Dispense Refill    atorvastatin (LIPITOR) 10 MG tablet TAKE 1 TABLET DAILY 90 tablet 3    cloNIDine (CATAPRES) 0.1 MG tablet One po  Prn for systolic over 180 30 tablet 11    eszopiclone (LUNESTA) 2 MG tablet TAKE 1 TABLET NIGHTLY      IMMEDIATELY BEFORE BEDTIME 90 tablet 1    losartan (COZAAR) 100 MG tablet Take 1 tablet (100 mg total) by mouth daily 7 tablet 3     No current facility-administered medications for this visit.          Review of Systems   Constitutional: Negative for chills and fever.   HENT: Negative for congestion, sinus pain and sore throat.  Eyes: Negative for blurred vision.   Respiratory: Negative for cough and shortness of breath.    Cardiovascular: Negative for chest pain, palpitations and leg swelling.   Gastrointestinal: Negative for diarrhea, heartburn, nausea and vomiting.   Genitourinary: Negative for dysuria.   Musculoskeletal: Negative for joint pain and myalgias.   Neurological: Negative for dizziness and headaches.   Psychiatric/Behavioral: Negative for depression. The patient is not nervous/anxious.        Physical Exam  BP (!) 190/116 (BP Site: Left arm, Patient Position: Sitting, Cuff Size: Large)    Pulse 76    Temp 97.9 F (36.6 C) (Oral)    Resp 18    Ht 1.854 m (6\' 1" )    Wt 88.5 kg (195 lb 3.2 oz)    SpO2 99%    BMI 25.75 kg/m   Wt Readings from Last 3 Encounters:   02/20/19 88.5 kg (195 lb 3.2 oz)   08/18/18 88 kg (194 lb)   07/05/17 84.4 kg (186 lb)       Physical Exam   Constitutional: He is oriented to person, place, and time and well-developed, well-nourished, and  in no distress.   HENT:   Head: Normocephalic and atraumatic.   Mouth/Throat: Oropharynx is clear and moist.   Eyes: Pupils are equal, round, and reactive to light. Conjunctivae and EOM are normal.   Neck: Normal range of motion. Neck supple.   Cardiovascular: Normal rate, regular rhythm, normal heart sounds and intact distal pulses.   Pulmonary/Chest: Effort normal and breath sounds normal.   Abdominal: Soft. Bowel sounds are normal. He exhibits no distension.   Musculoskeletal: Normal range of motion.   Neurological: He is alert and oriented to person, place, and time.   Skin: Skin is warm and dry.   Psychiatric: Affect normal.          There are no diagnoses linked to this encounter.        Risks & benefits of the new medication(s) were explained to the patient, who appeared to understand and agrees to the treatment plan.    1. Annual physical exam  Recommend diet management avoid fats, meats and carbs and increase fish, fiber and fruit and plant based diet, and exercise CV 30 minutes daily  Increase plant based protein for example broccoli, cauliflower, soy, tofu, Quinoa, lentils    - Electrolytes and kidney function are normal  - Liver functions are normal  - Fasting blood sugar is normal with no evidence of diabetes  - Total cholesterol is 176 (goal < 200) triglycerides are 80 (goal < 150), HDL or good cholesterol is 68 (goal >40) LDL or bad    cholesterol is 92 (numeric goal depends on overall cardiac risk estimation)  ON Statin cont medication    - CBC and differential - normal no evidence of infection or inflammation  - TSH - normal (thyroid)  - Urinalysis - normal no evidence of infection   - Vitamin D,25 OH, Total - normal 30 or greater      Tobacco screen    Tobacco Dependence:  Social History    Tobacco Use      Smoking status: Never Smoker      Smokeless tobacco: Never Used    Counseling given: Yes    Normal healthy guidelines    Regular exercise 45-60 min 4-6 times a week  Limit white foods that are  high in carbohydrates: bread, rice, potatoes, pasta, sugar, and flour.  Low fat, low cholesterol diet  Colonoscopy to screen for colon cancer by age 46, and as recommended thereafter based on your results  See the dentist every 6-12 months  Safety guidelines wear seatbelts and sunscreen and sunglasses and hat to protect from sun exposure  Wear sunscreen for exposures over 15 min.- recommend 30-70 proof  Wear seatbelts whether driving or a passenger.  Do not drink alcohol and drive, or ride in a vehicle with another driver who has been drinking.  Maintain a healthy weight. Your BMI should be 25 or less  BMI > 25 means you are clinically overweight  BMI > 30 means you are clinically obese    Men:  PSA anually > 51 yo  Advanced directive recommend to file with PMD and can supply for [place] to keep on chart    2. Lumbar spondylosis with myelopathy    - Neurosurgery Referral: Elissa Hefty, MD [facility])    3. Essential hypertension  Cont losartan and will use Clonidine as recommended but advised to monitor am/pmpressures to see if any adjustments need to be made    4. Hyperacusis of both ears  Recommend t discuss with neurosurgeon if there are any new potential recs, cpm    5. Dupuytren's contracture of left hand  stable    [name], MD  2:50 PM 02/20/2019    [facility]  [address]  [place], [place] [address]  P) [phone]  F) [phone]  [email or web address]

## 2019-02-21 ENCOUNTER — Other Ambulatory Visit (INDEPENDENT_AMBULATORY_CARE_PROVIDER_SITE_OTHER): Payer: Self-pay | Admitting: Internal Medicine

## 2019-02-21 DIAGNOSIS — G479 Sleep disorder, unspecified: Secondary | ICD-10-CM

## 2019-02-21 MED ORDER — ESZOPICLONE 2 MG PO TABS
ORAL_TABLET | ORAL | 1 refills | Status: DC
Start: 2019-02-21 — End: 2019-07-31

## 2019-02-21 NOTE — Progress Notes (Signed)
Good morning Frederick [name],    It was a pleasure meeting you yesterday.  I am sending you the referral to a spinal neurosurgery specialist recommended by Dr. Sherilyn Cooter.   Dr. Stana Bunting contact information is included on the referral.  Please do not hesitate to contact the office if you have any questions or concerns.    Take care and stay safe,    Roger Shelter BSN, RN  [facility]  [address]  [place], [place]  [address]  P [phone]   F [phone]  [email or web address]

## 2019-03-01 ENCOUNTER — Telehealth: Payer: Self-pay | Admitting: Internal Medicine

## 2019-03-01 NOTE — Telephone Encounter (Signed)
[facility].

## 2019-03-03 ENCOUNTER — Encounter (INDEPENDENT_AMBULATORY_CARE_PROVIDER_SITE_OTHER): Payer: Self-pay

## 2019-03-05 ENCOUNTER — Encounter: Payer: Self-pay | Admitting: Internal Medicine

## 2019-03-13 ENCOUNTER — Telehealth: Payer: Medicare Other | Admitting: Internal Medicine

## 2019-03-13 DIAGNOSIS — I159 Secondary hypertension, unspecified: Secondary | ICD-10-CM

## 2019-03-13 DIAGNOSIS — M4716 Other spondylosis with myelopathy, lumbar region: Secondary | ICD-10-CM

## 2019-03-13 MED ORDER — LOSARTAN POTASSIUM 100 MG PO TABS
100.0000 mg | ORAL_TABLET | Freq: Every day | ORAL | 3 refills | Status: DC
Start: 2019-03-13 — End: 2019-03-14

## 2019-03-13 MED ORDER — AMLODIPINE BESYLATE 5 MG PO TABS
5.0000 mg | ORAL_TABLET | Freq: Every day | ORAL | 0 refills | Status: DC
Start: 2019-03-13 — End: 2019-03-14

## 2019-03-13 NOTE — Telephone Encounter (Signed)
He has been monitoring BP diligently am and pm and noticed elevated BP on average 170-190 systolic on Losartan 100 mg and occasional clonidine.    This am BP 190/90 HR 40-50s    He has not used Clonidine times this past month    He denies Cp or pressure no sob no leg cramps or edema  Labs reviewed  Urine positive for protein 100 and Ketones 40 but Bun / Crt 17/0.9  And cholesterol panel WNL    TC 176 HDL 68 LDL 92 TG 80      Discussed medication options    1. Secondary hypertension    - losartan (COZAAR) 100 MG tablet; Take 1 tablet (100 mg total) by mouth daily  Dispense: 7 tablet; Refill: 3    2. Lumbar spondylosis with myelopathy    - Ambulatory referral to Neurosurgery      [facility]  amlodipine 5mg  po q daily x 30 days    R/b/i discussed will touch base in 2 weeks      [name], MD  8:47 AM 03/13/2019    [facility]  [address]  [place], [place] [address]  P) [phone]  F) [phone]  [email or web address]

## 2019-03-14 ENCOUNTER — Other Ambulatory Visit: Payer: Self-pay | Admitting: Internal Medicine

## 2019-03-14 DIAGNOSIS — I159 Secondary hypertension, unspecified: Secondary | ICD-10-CM

## 2019-03-14 MED ORDER — AMLODIPINE BESYLATE 5 MG PO TABS
5.0000 mg | ORAL_TABLET | Freq: Every day | ORAL | 0 refills | Status: DC
Start: 2019-03-14 — End: 2019-04-09

## 2019-03-14 MED ORDER — LOSARTAN POTASSIUM 100 MG PO TABS
100.0000 mg | ORAL_TABLET | Freq: Every day | ORAL | 3 refills | Status: DC
Start: 2019-03-14 — End: 2020-03-10

## 2019-03-14 NOTE — Telephone Encounter (Signed)
[facility] is requesting new prescription for Cozaar 100 mg PO daily.  They will only accept a minimum supply of 22 tablets and the original was for 7 tablets.

## 2019-03-14 NOTE — Telephone Encounter (Signed)
Good afternoon Frederick [name],    I have attached the referral to Dr. Verl Dicker, a neurosurgeon, from Dr. Sherilyn Cooter to this email.  His contact information is included.  If you have any difficulties scheduling an appointment, do not hesitate to contact either myself or Dr. Sherilyn Cooter for assistance.    Take care and stay safe,    Roger Shelter BSN, RN  [facility]  [address]  [place], [place]  [address]  P [phone]   F [phone]  [email or web address]        This communication may contain confidential and/or privileged information. Additionally, this communication may contain protected health information (PHI) that is legally protected from inappropriate disclosure by the Privacy Standards of the [employer] Portability and Accountability Act (HIPAA) and relevant [employer]. If you are not the intended recipient, please note that any dissemination, distribution or copying of this communication is strictly prohibited. If you have received this message in error, you should notify the sender immediately by telephone or by return e-mail and delete this message from your computer. Direct questions to the Personal assistant at [phone].

## 2019-03-14 NOTE — Telephone Encounter (Signed)
Pharmacy is requesting a 90 day supply of RX. Original RX for 30 day supply.

## 2019-03-27 ENCOUNTER — Telehealth: Payer: Self-pay | Admitting: Internal Medicine

## 2019-03-27 NOTE — Telephone Encounter (Signed)
Pt's wife wants a quick and urgent phone call with you regarding his appt with the neuro surgeon tomorrow.

## 2019-03-27 NOTE — Telephone Encounter (Signed)
Jodie has an appointment with Dr Rayann Heman for neurosurgeon.    She is concerned that he may be exposed for Covid19    She is concerned that she is unable to control the environment and would like to have appointment moved to the [facility] office    [name], MD  11:49 AM 03/27/2019    [facility]  [address]  [place], [place] [address]  P) [phone]  F) [phone]  [email or web address]

## 2019-03-28 ENCOUNTER — Ambulatory Visit (INDEPENDENT_AMBULATORY_CARE_PROVIDER_SITE_OTHER): Payer: Medicare Other | Admitting: Neurological Surgery

## 2019-03-28 NOTE — Telephone Encounter (Signed)
Dr Rayann Heman office has been contacted and they have moved the appointment to the [facility] location Sept 8, 2020    [name], MD  9:48 AM 03/28/2019    [facility]  [address]  [place], [place] [address]  P) [phone]  F) [phone]  [email or web address]

## 2019-03-30 NOTE — Progress Notes (Signed)
[facility] Neurosurgery  New Patient Note    Referring MD: Lynelle Smoke, *   Primary Care MD: Lynelle Smoke, MD     MRN: [id number]    HPI   Chief Complaint:  Back pain    HPI  Frederick [name] is a 73 y.o. male with history of hypertension and hyperlipidemia who presents for chronic axial low back pain for past 50 years, worsening for past year. He describes constant stiffness in his low back across the beltline, equal on left and right side. The pain worsens with activity, and he has been unable to play golf for the past 2 months. He denies pain radiating into the legs, weakness, paresthesias and loss of bowel/bladder control. He has been managing with home exercise and stretching, occasional tylenol. He has not tried physical therapy or injections. He has no recent MRI.     Impression   I had a lengthy discussion with Mr. and Frederick [name].  Frederick [name] continues to complain about axial mechanical low back pain with no specific radiculopathy.  He is very active and exercises regularly.  Over the past 1 year or so he has become quite frustrated with the back pain and it is negatively impacting his quality of life.  He has not undergone any formal treatment for this besides his own routine stretching and his own routine regimen.  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    After lengthy discussion, I recommend that he consider epidural steroid injections as well as formal physical therapy.  I also recommend he get an MRI of the lumbar spine to evaluate the neural structures.  The patient is in full agreement with this.    Plan   1. MRI Lumbar spine to evaluate degenerative disc disease  2. Refer to pain management Dr. Reva Bores for consultation and ESIs  3. Follow up to review MRI findings    Again, many thanks for allowing me to participate in the care of this patient.  I will keep you apprised of their progress.  Please call me  with any questions.    Sincerely,    Delena Bali MD    Neurosurgery  [phone]  (cell)  [phone]  (PA)   Alessandra Bevels, PA   ([email or web address])        Follow-up   Return in about 2 weeks (around 04/17/2019) for MRI review.     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    Psoriasis     Tinnitus 1993        Surgical History     Past Surgical History:   Procedure Laterality Date    APPENDECTOMY  1973    COLONOSCOPY  02/2011    COLONOSCOPY N/A 08/24/2016    Procedure: COLONOSCOPY;  Surgeon: Lestine Mount, MD;  Location: [facility];  Service: Gastroenterology;  Laterality: N/A;  COLONOSCOPY    EXCISION, CYST  12/2006    back    KNEE ARTHROSCOPY Left 09/2011    RELEASE, 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    RELEASE, 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    ROTATOR CUFF REPAIR Left 09/2000    and shaving of bone spur        Family History     Family History   Problem Relation Age of Onset    Alzheimer's disease Mother     Heart attack Father     Stroke Paternal Grandfather         Social History     Social History     Tobacco Use    Smoking status: Never Smoker    Smokeless tobacco: Never Used   Substance Use Topics    Alcohol use: Yes     Alcohol/week: 14.0 standard drinks     Types: 14 Glasses of wine per week     Frequency: 4 or more times a week     Drinks per session: 1 or 2     Binge frequency: Never          Current Medications       Current Outpatient Medications:     amLODIPine (NORVASC) 5 MG tablet, Take 1 tablet (5 mg total) by mouth daily Already spoke with pharmacist this is a new medication trial of 30 day  DR Sherilyn Cooter, Disp: 30 tablet, Rfl: 0    atorvastatin (LIPITOR) 10 MG tablet, TAKE 1 TABLET DAILY, Disp: 90 tablet, Rfl: 3    eszopiclone (LUNESTA) 2 MG tablet, TAKE 1 TABLET NIGHTLY      IMMEDIATELY BEFORE BEDTIME, Disp: 90 tablet, Rfl: 1    losartan (COZAAR) 100 MG tablet, Take 1 tablet (100 mg total) by mouth daily, Disp: 90 tablet, Rfl: 3    cloNIDine (CATAPRES) 0.1 MG tablet, One po  Prn for systolic over 180, Disp: 30 tablet, Rfl: 11     Allergies   No Known Allergies     Review of Systems   Review of Systems   HENT: Positive for hearing loss and tinnitus.    Musculoskeletal: Positive for back pain    All other systems reviewed and are negative.    Physical Examination   VITAL SIGNS:    Vitals:    04/03/19 1031   BP: (!) 193/96   Pulse: 62         General:  Well developed, well nourished, no apparent distress  Neck:  Supple, no JVD, no apparent lymphadenopathy  HEENT:  Head normocephalic, atraumatic, no obvious lesions in ear, nose or throat  Chest:  Equal chest rise.  No wheezes, rales or rhonchi.  Skin:  No obvious lesions or scars  Extremities:  Without clubbing or cyanosis    Neurologic Exam    Awake, alert, oriented x3, Follows commands  GCS: 15   Speech is clear  Attention span and concentration: intact  Recent and remote memory: intact    Gait Intact  Able to walk on toes and heels    Point tenderness: None   FROM lumbar spine    Motor Exam:     R L   L2-3 Iliopsoas (Hip Flexion)  5 5  L3-4 Quadriceps (Knee Extension)  5 5  L5-S1 Hamstring (Knee Flexion)  5 5  L4-5 Tibialis Anterior (Foot Dorsiflexion) 5 5  S1 Gastrocsoleus (Plantar Flexion) 5 5  L5 EHL (Toe Dorsiflexion)  5 5      Sensation intact bilaterally     Reflexes:   Patellar (  L3-4): right  2+, left 2+  Achilles (L5-S1): right 2+, left 2+  Clonus: right negative, left negative   Babinski: right negative, left negative     Straight leg raise:  right negative, left negative     Radiology Interpretation   08/22/2018 X-ray Lumbar  Spine:    Impression:  Lumbar spondylosis. There is diffuse disc space narrowing and facet arthropathy, worst at L4-5 and L5-S1.     The images above were personally reviewed and discussed in detail with the patient.     Jerilynn Mages, MD

## 2019-04-03 ENCOUNTER — Ambulatory Visit: Payer: Medicare Other

## 2019-04-03 ENCOUNTER — Encounter (INDEPENDENT_AMBULATORY_CARE_PROVIDER_SITE_OTHER): Payer: Self-pay | Admitting: Neurological Surgery

## 2019-04-03 ENCOUNTER — Ambulatory Visit (INDEPENDENT_AMBULATORY_CARE_PROVIDER_SITE_OTHER): Payer: Medicare Other | Admitting: Neurological Surgery

## 2019-04-03 ENCOUNTER — Ambulatory Visit: Admission: RE | Admit: 2019-04-03 | Payer: Self-pay | Source: Ambulatory Visit

## 2019-04-03 ENCOUNTER — Encounter (INDEPENDENT_AMBULATORY_CARE_PROVIDER_SITE_OTHER): Payer: Self-pay

## 2019-04-03 ENCOUNTER — Other Ambulatory Visit: Payer: Self-pay

## 2019-04-03 DIAGNOSIS — M545 Low back pain: Secondary | ICD-10-CM

## 2019-04-03 DIAGNOSIS — M47816 Spondylosis without myelopathy or radiculopathy, lumbar region: Secondary | ICD-10-CM

## 2019-04-03 DIAGNOSIS — G8929 Other chronic pain: Secondary | ICD-10-CM

## 2019-04-03 NOTE — Progress Notes (Signed)
Review of Systems   HENT: Positive for hearing loss and tinnitus.    Musculoskeletal: Positive for back pain.     Taken by: Conley Simmonds, LPN

## 2019-04-04 ENCOUNTER — Ambulatory Visit
Admission: RE | Admit: 2019-04-04 | Discharge: 2019-04-04 | Disposition: A | Discharge status: Home / Self Care | Payer: Medicare Other | Source: Ambulatory Visit | Attending: Physician Assistant | Admitting: Physician Assistant

## 2019-04-04 DIAGNOSIS — R609 Edema, unspecified: Secondary | ICD-10-CM | POA: Insufficient documentation

## 2019-04-04 DIAGNOSIS — M48061 Spinal stenosis, lumbar region without neurogenic claudication: Secondary | ICD-10-CM | POA: Insufficient documentation

## 2019-04-04 DIAGNOSIS — M5136 Other intervertebral disc degeneration, lumbar region: Secondary | ICD-10-CM | POA: Insufficient documentation

## 2019-04-04 DIAGNOSIS — M47816 Spondylosis without myelopathy or radiculopathy, lumbar region: Secondary | ICD-10-CM

## 2019-04-04 DIAGNOSIS — G8929 Other chronic pain: Secondary | ICD-10-CM

## 2019-04-05 ENCOUNTER — Encounter (INDEPENDENT_AMBULATORY_CARE_PROVIDER_SITE_OTHER): Payer: Self-pay | Admitting: Physician Assistant

## 2019-04-05 ENCOUNTER — Encounter (INDEPENDENT_AMBULATORY_CARE_PROVIDER_SITE_OTHER): Payer: Self-pay

## 2019-04-09 ENCOUNTER — Other Ambulatory Visit: Payer: Self-pay

## 2019-04-09 DIAGNOSIS — I159 Secondary hypertension, unspecified: Secondary | ICD-10-CM

## 2019-04-09 MED ORDER — AMLODIPINE BESYLATE 5 MG PO TABS
5.0000 mg | ORAL_TABLET | Freq: Every day | ORAL | 0 refills | Status: DC
Start: 2019-04-09 — End: 2019-05-07

## 2019-04-09 NOTE — Telephone Encounter (Signed)
Patient faxed at home BP readings from 03/13/19-09/14-20 and they have been scanned into the chart. Patient would like a refill of amlodipine 5mg  unless you feel the dose needs to be increased?

## 2019-04-19 ENCOUNTER — Encounter (INDEPENDENT_AMBULATORY_CARE_PROVIDER_SITE_OTHER): Payer: Self-pay | Admitting: Neurological Surgery

## 2019-04-27 ENCOUNTER — Telehealth: Payer: Self-pay

## 2019-04-27 NOTE — Telephone Encounter (Signed)
COVID-19 Questionnaire:  Last updated: Nov 30, 2018    Fever: no  Cough: no  Dyspnea: no  Additional Symptoms: denies chills, shaking chills, sore throat, headache, body aches, loss of taste or smell, vomiting or diarrhea.  Close contact with a laboratory confirmed COVID-19 person: no  Therapist, occupational:  no  Immunocompromised:  no immunocompromising conditions  Long term facility or nursing home resident: No  Sole provider for dependent at high risk:  no  Pregnant 3rd trimester or High Risk Pregnancy: N/A      Patient instructed to wear a mask into the building and to call when parked prior to coming into the office to ensure there is no one else in the waiting area. Patient informed to arrive no more than 10 minutes prior to the appointment time and to call to the office should any symptoms appear prior to the appointment. Patient verbalized understanding.

## 2019-04-30 ENCOUNTER — Ambulatory Visit (INDEPENDENT_AMBULATORY_CARE_PROVIDER_SITE_OTHER): Payer: Medicare Other

## 2019-04-30 DIAGNOSIS — Z23 Encounter for immunization: Secondary | ICD-10-CM

## 2019-04-30 NOTE — Progress Notes (Signed)
Flublok vaccine administered to left arm using aseptic technique per protocol. Patient tolerated well. VIS reviewed and given to patient. Patient instructed to wait in the office for 15 minutes after vaccination.    Patient left in stable condition.   Instructed to report any local adverse reactions:  · Pain.  · Redness and swelling at the injection site that last longer than 5 days.

## 2019-05-03 ENCOUNTER — Encounter (INDEPENDENT_AMBULATORY_CARE_PROVIDER_SITE_OTHER): Payer: Self-pay

## 2019-05-07 ENCOUNTER — Other Ambulatory Visit: Payer: Self-pay

## 2019-05-07 DIAGNOSIS — I159 Secondary hypertension, unspecified: Secondary | ICD-10-CM

## 2019-05-07 MED ORDER — AMLODIPINE BESYLATE 5 MG PO TABS
5.0000 mg | ORAL_TABLET | Freq: Every day | ORAL | 0 refills | Status: DC
Start: 2019-05-07 — End: 2019-08-01

## 2019-05-11 ENCOUNTER — Encounter (INDEPENDENT_AMBULATORY_CARE_PROVIDER_SITE_OTHER): Payer: Self-pay | Admitting: Neurological Surgery

## 2019-06-03 ENCOUNTER — Encounter (INDEPENDENT_AMBULATORY_CARE_PROVIDER_SITE_OTHER): Payer: Self-pay

## 2019-06-11 ENCOUNTER — Encounter (INDEPENDENT_AMBULATORY_CARE_PROVIDER_SITE_OTHER): Payer: Self-pay | Admitting: Neurological Surgery

## 2019-06-19 ENCOUNTER — Encounter (INDEPENDENT_AMBULATORY_CARE_PROVIDER_SITE_OTHER): Payer: Self-pay | Admitting: Neurological Surgery

## 2019-06-19 ENCOUNTER — Encounter (INDEPENDENT_AMBULATORY_CARE_PROVIDER_SITE_OTHER): Payer: Self-pay

## 2019-07-02 ENCOUNTER — Encounter (INDEPENDENT_AMBULATORY_CARE_PROVIDER_SITE_OTHER): Payer: Self-pay | Admitting: Neurological Surgery

## 2019-07-03 ENCOUNTER — Encounter (INDEPENDENT_AMBULATORY_CARE_PROVIDER_SITE_OTHER): Payer: Self-pay

## 2019-07-31 ENCOUNTER — Other Ambulatory Visit: Payer: Self-pay

## 2019-07-31 ENCOUNTER — Encounter (INDEPENDENT_AMBULATORY_CARE_PROVIDER_SITE_OTHER): Payer: Self-pay | Admitting: Neurological Surgery

## 2019-07-31 DIAGNOSIS — G479 Sleep disorder, unspecified: Secondary | ICD-10-CM

## 2019-07-31 MED ORDER — ATORVASTATIN CALCIUM 10 MG PO TABS
10.0000 mg | ORAL_TABLET | Freq: Every day | ORAL | 3 refills | Status: DC
Start: 2019-07-31 — End: 2020-01-30

## 2019-07-31 MED ORDER — ESZOPICLONE 2 MG PO TABS
ORAL_TABLET | ORAL | 1 refills | Status: DC
Start: 2019-07-31 — End: 2020-02-18

## 2019-07-31 NOTE — Telephone Encounter (Signed)
Refilled atorvastatin and eszpiclone.

## 2019-07-31 NOTE — Telephone Encounter (Signed)
Patients wife called requesting refills for the following two medications (Lipitor and Lunesta) to be sent to [facility]-- Dr. Sherilyn Cooter patients

## 2019-08-01 ENCOUNTER — Other Ambulatory Visit: Payer: Self-pay

## 2019-08-01 DIAGNOSIS — I159 Secondary hypertension, unspecified: Secondary | ICD-10-CM

## 2019-08-02 ENCOUNTER — Encounter: Payer: Self-pay | Admitting: Internal Medicine

## 2019-08-03 ENCOUNTER — Encounter (INDEPENDENT_AMBULATORY_CARE_PROVIDER_SITE_OTHER): Payer: Self-pay

## 2019-08-06 ENCOUNTER — Telehealth: Payer: Self-pay | Admitting: Internal Medicine

## 2019-08-06 MED ORDER — AMLODIPINE BESYLATE 5 MG PO TABS
5.0000 mg | ORAL_TABLET | Freq: Every day | ORAL | 1 refills | Status: DC
Start: 2019-08-06 — End: 2020-03-10

## 2019-08-06 NOTE — Telephone Encounter (Signed)
Patient's wife called Judeth Cornfield) issue with his amlodipine from the [facility].  [facility] are [facility] to [facility] Dr[facility] Sherilyn Cooter but have yet to hear from our office.    Wife's number is [phone]

## 2019-08-06 NOTE — Telephone Encounter (Signed)
Spoke with wife, pt's blood pressures ranging around the 130/60-70s. No significant lows or highs and asymptomatic. Significant improvement of averages SBPs in the 180s before amlodipine. No further questions from the wife. Refilled amlodipine.    Did answer questions about the vaccine and informed her as information becomes available and 1c groups are opened, we will be reaching out.

## 2019-08-06 NOTE — Telephone Encounter (Signed)
Refill encounter already in Epic. Routed to MD to follow up on refill.

## 2019-08-18 ENCOUNTER — Ambulatory Visit (INDEPENDENT_AMBULATORY_CARE_PROVIDER_SITE_OTHER): Payer: Medicare Other

## 2019-08-18 ENCOUNTER — Ambulatory Visit: Payer: Medicare Other | Attending: Internal Medicine

## 2019-08-18 DIAGNOSIS — Z23 Encounter for immunization: Secondary | ICD-10-CM | POA: Insufficient documentation

## 2019-08-18 NOTE — Progress Notes (Signed)
   Covid-19 Vaccination Clinic  Name:  Austin Carter    MRN: [id number] DOB: 04/07/1946  08/18/2019  [name] was observed post Covid-19 immunization for 15 minutes without incidence. He was provided with Vaccine Information Sheet and instruction to access the V-Safe system.   [name] was instructed to call 911 with any severe reactions post vaccine: Marland Kitchen Difficulty breathing  . Swelling of your face and throat  . A fast heartbeat  . A bad rash all over your body  . Dizziness and weakness    Immunizations Administered    Name Date Dose VIS Date Route   Pfizer COVID-19 Vaccine 08/18/2019 12:52 PM 0.3 mL 07/06/2019 Intramuscular   Manufacturer: [employer], [employer]   Lot: [id number]   NDC: [address]-1

## 2019-09-03 ENCOUNTER — Encounter (INDEPENDENT_AMBULATORY_CARE_PROVIDER_SITE_OTHER): Payer: Self-pay

## 2019-09-07 ENCOUNTER — Ambulatory Visit: Payer: PRIVATE HEALTH INSURANCE

## 2019-09-08 ENCOUNTER — Ambulatory Visit: Payer: Medicare HMO | Attending: Internal Medicine

## 2019-09-08 DIAGNOSIS — Z23 Encounter for immunization: Secondary | ICD-10-CM | POA: Insufficient documentation

## 2019-09-08 NOTE — Progress Notes (Signed)
   Covid-19 Vaccination Clinic  Name:  Austin Carter    MRN: [id number] DOB: 1945-11-07  09/08/2019  [name] was observed post Covid-19 immunization for 15 minutes without incidence. He was provided with Vaccine Information Sheet and instruction to access the V-Safe system.   [name] was instructed to call 911 with any severe reactions post vaccine: Marland Kitchen Difficulty breathing  . Swelling of your face and throat  . A fast heartbeat  . A bad rash all over your body  . Dizziness and weakness    Immunizations Administered    Name Date Dose VIS Date Route   Pfizer COVID-19 Vaccine 09/08/2019 11:39 AM 0.3 mL 07/06/2019 Intramuscular   Manufacturer: [employer], [employer]   Lot: [id number]   NDC: [address]-2

## 2019-09-15 ENCOUNTER — Ambulatory Visit (INDEPENDENT_AMBULATORY_CARE_PROVIDER_SITE_OTHER): Payer: Medicare Other

## 2019-09-15 DIAGNOSIS — Z23 Encounter for immunization: Secondary | ICD-10-CM

## 2019-09-15 NOTE — Progress Notes (Signed)
COVID-19 Vaccination:  Frederick [name]   1945-10-07  09/15/2019     Patient presented to the office for the following COVID-19 vaccine administration:    [x]  Moderna   []  Pfizer-BioNTech       []  Dose #1   [x]  Dose #2   (Both doses of the series should be completed with the same product.)    [x]  A verbal consent has been obtained from patient to administer COVID-19 vaccine that is currently being offered under Emergency Use Authorization (EUA). Patient/Caregiver has been provided a copy of EUA.      [x]  Pt has not received other non-COVID vaccinations within 14 days of administration of COVID-19 vaccination.  [x]  Pt was screened for precautions and contraindications to vaccine  []  24-28+ day interval observed between first and second dose (Moderna)  []  17-21+ day interval observed between first and second dose (Pfizer-BioNTech)    Pt was observed post-vaccination for the occurrence of immediate adverse reactions:   [x]  15 minutes: No prior history of significant allergic reaction to vaccine or injectable therapy or history of anaphylaxis due to any cause   []  30 minutes: Prior history of an immediate allergic reaction of any severity to a vaccine or injectable therapy (urticaria, angioedema, respiratory distress (e.g., wheezing, stridor))  []  30 minutes: Prior history of anaphylaxis due to any cause     Post-Vaccination Observation:  [x]  No reaction was noted and patient left in good condition.  []  Arm Soreness  []  Erythema  []  Hives  []  Other:    2nd Dose Instructions:  []  Pt instructed to schedule 2nd dose at front desk before leaving facility

## 2019-10-02 ENCOUNTER — Encounter (INDEPENDENT_AMBULATORY_CARE_PROVIDER_SITE_OTHER): Payer: Self-pay | Admitting: Neurological Surgery

## 2019-11-01 ENCOUNTER — Encounter (INDEPENDENT_AMBULATORY_CARE_PROVIDER_SITE_OTHER): Payer: Self-pay

## 2019-11-05 ENCOUNTER — Encounter (INDEPENDENT_AMBULATORY_CARE_PROVIDER_SITE_OTHER): Payer: Self-pay | Admitting: Neurological Surgery

## 2019-12-01 ENCOUNTER — Encounter (INDEPENDENT_AMBULATORY_CARE_PROVIDER_SITE_OTHER): Payer: Self-pay

## 2019-12-05 ENCOUNTER — Other Ambulatory Visit: Payer: Self-pay

## 2019-12-05 DIAGNOSIS — E785 Hyperlipidemia, unspecified: Secondary | ICD-10-CM

## 2019-12-05 DIAGNOSIS — E559 Vitamin D deficiency, unspecified: Secondary | ICD-10-CM

## 2019-12-05 DIAGNOSIS — R351 Nocturia: Secondary | ICD-10-CM

## 2019-12-05 DIAGNOSIS — N401 Enlarged prostate with lower urinary tract symptoms: Secondary | ICD-10-CM

## 2019-12-05 DIAGNOSIS — I10 Essential (primary) hypertension: Secondary | ICD-10-CM

## 2019-12-05 DIAGNOSIS — F5101 Primary insomnia: Secondary | ICD-10-CM

## 2019-12-06 ENCOUNTER — Encounter: Payer: Self-pay | Admitting: Internal Medicine

## 2019-12-06 ENCOUNTER — Ambulatory Visit (INDEPENDENT_AMBULATORY_CARE_PROVIDER_SITE_OTHER): Payer: Medicare Other | Admitting: Internal Medicine

## 2019-12-06 ENCOUNTER — Ambulatory Visit (FREE_STANDING_LABORATORY_FACILITY): Payer: Medicare Other

## 2019-12-06 VITALS — BP 165/98 | HR 70 | Resp 16 | Ht 74.0 in | Wt 193.1 lb

## 2019-12-06 DIAGNOSIS — E782 Mixed hyperlipidemia: Secondary | ICD-10-CM

## 2019-12-06 DIAGNOSIS — I10 Essential (primary) hypertension: Secondary | ICD-10-CM

## 2019-12-06 DIAGNOSIS — N401 Enlarged prostate with lower urinary tract symptoms: Secondary | ICD-10-CM

## 2019-12-06 DIAGNOSIS — Z0000 Encounter for general adult medical examination without abnormal findings: Secondary | ICD-10-CM

## 2019-12-06 DIAGNOSIS — I119 Hypertensive heart disease without heart failure: Secondary | ICD-10-CM

## 2019-12-06 DIAGNOSIS — E785 Hyperlipidemia, unspecified: Secondary | ICD-10-CM

## 2019-12-06 DIAGNOSIS — E559 Vitamin D deficiency, unspecified: Secondary | ICD-10-CM

## 2019-12-06 DIAGNOSIS — R351 Nocturia: Secondary | ICD-10-CM

## 2019-12-06 DIAGNOSIS — H93233 Hyperacusis, bilateral: Secondary | ICD-10-CM

## 2019-12-06 DIAGNOSIS — M720 Palmar fascial fibromatosis [Dupuytren]: Secondary | ICD-10-CM

## 2019-12-06 LAB — CBC AND DIFFERENTIAL
Absolute NRBC: 0 10*3/uL (ref 0.00–0.00)
Basophils Absolute Automated: 0.01 10*3/uL (ref 0.00–0.08)
Basophils Automated: 0.2 %
Eosinophils Absolute Automated: 0.07 10*3/uL (ref 0.00–0.44)
Eosinophils Automated: 1.3 %
Hematocrit: 47.8 % (ref 37.6–49.6)
Hgb: 15.5 g/dL (ref 12.5–17.1)
Immature Granulocytes Absolute: 0.02 10*3/uL (ref 0.00–0.07)
Immature Granulocytes: 0.4 %
Lymphocytes Absolute Automated: 0.76 10*3/uL (ref 0.42–3.22)
Lymphocytes Automated: 14.1 %
MCH: 30.6 pg (ref 25.1–33.5)
MCHC: 32.4 g/dL (ref 31.5–35.8)
MCV: 94.5 fL (ref 78.0–96.0)
MPV: 9.9 fL (ref 8.9–12.5)
Monocytes Absolute Automated: 0.42 10*3/uL (ref 0.21–0.85)
Monocytes: 7.8 %
Neutrophils Absolute: 4.12 10*3/uL (ref 1.10–6.33)
Neutrophils: 76.2 %
Nucleated RBC: 0 /100 WBC (ref 0.0–0.0)
Platelets: 265 10*3/uL (ref 142–346)
RBC: 5.06 10*6/uL (ref 4.20–5.90)
RDW: 13 % (ref 11–15)
WBC: 5.4 10*3/uL (ref 3.10–9.50)

## 2019-12-06 LAB — COMPREHENSIVE METABOLIC PANEL
ALT: 26 U/L (ref 0–55)
AST (SGOT): 24 U/L (ref 5–34)
Albumin/Globulin Ratio: 1.5 (ref 0.9–2.2)
Albumin: 4.2 g/dL (ref 3.5–5.0)
Alkaline Phosphatase: 60 U/L (ref 38–106)
Anion Gap: 11 (ref 5.0–15.0)
BUN: 12 mg/dL (ref 9.0–28.0)
Bilirubin, Total: 2.3 mg/dL — ABNORMAL HIGH (ref 0.2–1.2)
CO2: 24 mEq/L (ref 21–29)
Calcium: 9.5 mg/dL (ref 7.9–10.2)
Chloride: 107 mEq/L (ref 100–111)
Creatinine: 0.8 mg/dL (ref 0.5–1.5)
Globulin: 2.8 g/dL (ref 2.0–3.7)
Glucose: 112 mg/dL — ABNORMAL HIGH (ref 70–100)
Potassium: 3.9 mEq/L (ref 3.5–5.1)
Protein, Total: 7 g/dL (ref 6.0–8.3)
Sodium: 142 mEq/L (ref 136–145)

## 2019-12-06 LAB — URINE MICROSCOPIC: Urine Bacteria: NONE SEEN /hpf

## 2019-12-06 LAB — TSH: TSH: 2.72 u[IU]/mL (ref 0.35–4.94)

## 2019-12-06 LAB — URINALYSIS
Bilirubin, UA: NEGATIVE
Blood, UA: NEGATIVE
Glucose, UA: NEGATIVE
Ketones UA: 15 — AB
Leukocyte Esterase, UA: NEGATIVE
Nitrite, UA: NEGATIVE
Protein, UR: 30 — AB
Specific Gravity UA: 1.019 (ref 1.001–1.035)
Urine pH: 5.5 (ref 5.0–8.0)
Urobilinogen, UA: 0.2 (ref 0.2–2.0)

## 2019-12-06 LAB — LIPID PANEL
Cholesterol / HDL Ratio: 2.8
Cholesterol: 158 mg/dL (ref 0–199)
HDL: 56 mg/dL (ref 40–9999)
LDL Calculated: 79 mg/dL (ref 0–99)
Triglycerides: 113 mg/dL (ref 34–149)
VLDL Calculated: 23 mg/dL (ref 10–40)

## 2019-12-06 LAB — PSA: Prostate Specific Antigen, Total: 1.317 ng/mL (ref 0.000–4.000)

## 2019-12-06 LAB — GFR: EGFR: >60

## 2019-12-06 LAB — HEMOLYSIS INDEX: Hemolysis Index: 3 (ref 0–18)

## 2019-12-06 LAB — VITAMIN D,25 OH,TOTAL: Vitamin D, 25 OH, Total: 12 ng/mL — ABNORMAL LOW (ref 30–100)

## 2019-12-06 NOTE — Progress Notes (Signed)
Labs drawn from left [facility] without difficulty using aseptic technique. Patient tolerated well. Lab specimen sent to [facility]. Urine specimen collected. Patient instructed on clean-catch technique per protocol. Patient verbalized understanding.       EKG completed and shown to Dr. Sherilyn Cooter, scanned into chart.

## 2019-12-06 NOTE — Progress Notes (Signed)
Audiology test: Deferred by patient-- followed by audiologist for bilateral ear 20% hearing loss and tinnitus, wears hearing aids in bilateral ears    Vision test: Deferred by patient-- recently received annual eye exam    Pulmonary Function Test:  Not offered at this present time in setting of COVID-19.     Fitness Evaluation:    Weight   2021 = 193.1 lbs     Body Fat as percentage: In men, over 25% is obese, 20-25% is higher than normal, 16-20% is healthy / normal, <16% or under is considered lean / ideal.   2021 = 19.2%     Visceral Fat Level: Visceral fat is an indicator of the estimated fat surrounding the internal organs in the abdomen. High visceral fat levels increase the risk for developing heart disease and Type 2 Diabetes. A level of under 10 is recommended for optimal health.  . 2021 = 8    Current Exercise Program:   [employer] results reviewed in detail and compared to prior results.   Aerobics: Walks daily    Strength training:    Stretching: NA    Over age 60: Discussed the importance of strength training. Strength training provides a greater boost to metabolism than cardio. Through strength training, you can combat the natural decline of lean muscle mass that occurs around age 44 and beyond. People over age 27 lose 8% of their muscle mass every decade. Encouraged patient to begin strength training to build muscle mass and prevent muscle atrophy. Discussed with patient that building core muscle to help with balance and leg strength to help with muscle fatigue will help prolong the use of aids such as walkers and canes in the future. Patient understands that strength training also helps with bone density.    Fitness Goals:   Increase overall strength   Risk reduction related to family hx cardiovascular disease     Exercise: Begin an exercise routine.  It is important to perform cardio activities at least 3 - 5 days weekly for 30 minutes or longer.  Take the stairs at work, park in the farthest  spot, walk at lunch (find ways to fit in exercise during the day even if it's only for short times).      Nutrition / Dietary Goals:   Current diet: 1-2 glasses of wine per night     Mind-Body-Connection:   Sleep Habits: 6-8 hours per night on average   Stress relief activities: Golfing    Barriers to wellness:   None identified-- patient active participant in health and well-being

## 2019-12-06 NOTE — Progress Notes (Addendum)
Chief Complaint   Patient presents with   . Annual Exam       History of Present Illness    Frederick [name] is a 74 y.o. male who is here for an [facility]  annual physical examination. 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 House to the [employer]. 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 Frederick [name] is his wife    PROBLEMS:    White Cioat HTN - BP 165/98 is elevated and his BP log I normal at home he has   BP log reviewed 130/70 - 140s/80s no cp pressure sob no dizziness  He takes BP log on occaasion  Audiologist - specialist in [place] - Hyperacusis  Tinnitus he has hearing aids that are allowing for the tinnitus to be dulled out  1993 started with a traumatic event at a concert and tinnitus started in  2008 he was at a party  And again increased the tonality loss and then in 2017 - stepped on a deck and a skill saw was a sharp frequency  Insomnia - Lunesta 2 mg needs a refill  Back pain - stiffness - xrays show L4-L5 Jumped off a [place] at a young age - glucosamine - Dr Rayann Heman and Dr Reva Bores  ICS and 3 epidural  Have been excellent he has L5-S1 sharp pain will try to get neurolysis - deaden that nerve will do another one at 180 days from last which is in June    Dupytruens contraction L4-L5 bone spur developed with loss of height 1 inch  Dr Jillyn Hidden Pess -[place] - northern european influence, he needs to be seen 2 years ago  HLD - Lipitor 10 mg -     Patient Care Team:  Lynelle Smoke, MD as PCP - General (Internal Medicine)  Jerilynn Mages, MD as Consulting Physician (Neurological Surgery)  Pess, Janet Berlin, MD as Consulting Physician (Hand Surgery)       DIET:NO vitamins or supplements  Bkfst - banana, 2 cups decaf coffee artificial sweetner, home made white bread, fresh avacado, vegetarian sausage patty and slice of swiss or  american cheese, 1 scrambeled egg, caracara orange, small bowl of blueberries, canatloupe.    EXERCISE: active but with back issues unable to walk or swig a golf club    SLEEP: Lunesta every night 1 mg and he gets restorative sleep - 6 hours  1 x nocturia    STRESS: self care with time with wife, cooking, working out    CANCER: No family history of  Breast, uterine, cervical, ovary, colon, prostate    HEALTH MAINTENANCE:  Immunizations-up to date  Covid  Tdap   Flu vaccine   ShingRx-  will f/u at pharmacy/wait list    COLON:  PSA:  SKIN:      Patient Active Problem List    Diagnosis Date Noted   . Chronic bilateral low back pain without sciatica 04/03/2019   . Lumbar spondylosis 04/03/2019   . ERRONEOUS ENCOUNTER--DISREGARD 05/05/2017   . Dupuytren's contracture of left hand 08/09/2013   . Preop examination 08/09/2013   . Labile blood pressure 02/22/2013   . Dupuytren's contracture of both hands 02/22/2013   . Hyperacusis of both ears 02/22/2013   . Hyperlipidemia 01/18/2013   . Insomnia 01/18/2013   . Tinnitus  01/18/2013     Past Medical History:   Diagnosis Date   . Diverticulitis    . Hearing loss     wears hearing aids, about 20% bilaterally    . Hyperacusis of both ears     participating in audiotherapy    . Hyperlipidemia     controlled on meds   . Hypertension     home readings range from 120s/70s to 160s; per pt has white coat syndrome (BP at 08/28/15 PMD visit 167/94)   . 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 Psoriasis    . Tinnitus 1993     Past Surgical History:   Procedure Laterality Date   . APPENDECTOMY  1973   . COLONOSCOPY  02/2011   . COLONOSCOPY N/A 08/24/2016    Procedure: COLONOSCOPY;  Surgeon: Lestine Mount, MD;  Location: [id number];  Service: Gastroenterology;  Laterality: N/A;  COLONOSCOPY   . EXCISION, CYST  12/2006    back   . KNEE ARTHROSCOPY Left 09/2011   . 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 RELEASE, 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   . ROTATOR CUFF REPAIR Left 09/2000     and shaving of bone spur     Immunization History   Administered Date(s) Administered   . COVID-19 mRNA Vaccine Preservative Free 0.5 mL (MODERNA) 08/18/2019, 09/15/2019   . INFLUENZA HIGH DOSE 65 YRS+ 05/14/2015, 05/04/2016   . Influenza (Im) Preserved TRIVALENT VACCINE 07/10/2013   . Influenza Vacc QUAD Recombinant PF 62yrs & up 04/30/2019   . Influenza quadrivalent (IM) 6 months & up PRESERVED 05/05/2018   . Pneumococcal 23 valent 08/28/2015   . Pneumococcal Conjugate 13-Valent 04/18/2014   . Tdap 04/18/2014   . Zoster [facility]) Vaccine Recombinant 03/24/2017, 07/29/2017   . Zoster (ZOSTAVAX) Vaccine 09/03/2015     Health Maintenance   Topic Date Due   . INFLUENZA VACCINE  02/24/2020   . FALLS RISK ANNUAL  12/05/2020   . DEPRESSION SCREENING  12/05/2020   . [facility] CARE PLAN LETTER  12/05/2020   . Medicare Annual Wellness Visit  12/05/2020   . COLONOSCOPY EVERY FOUR YEARS  02/22/2021   . HEPATITIS C SCREENING  Completed   . Shingrix Vaccine 50+  Completed   . COVID-19 Vaccine  Completed   . Pneumonia Vaccine Age 21+  Completed   :  Current Outpatient Medications   Medication Sig Dispense Refill   . amLODIPine (NORVASC) 5 MG tablet Take 1 tablet (5 mg total) by mouth daily Already spoke with pharmacist this is a new medication trial of 30 day DR Sherilyn Cooter 90 tablet 1   . atorvastatin (LIPITOR) 10 MG tablet Take 1 tablet (10 mg total) by mouth daily 90 tablet 3   . eszopiclone (LUNESTA) 2 MG tablet TAKE 1 TABLET NIGHTLY      IMMEDIATELY BEFORE BEDTIME 90 tablet 1   . losartan (COZAAR) 100 MG tablet Take 1 tablet (100 mg total) by mouth daily (Patient taking differently: Take 100 mg by mouth nightly   ) 90 tablet 3     No current facility-administered medications for this visit.     No Known Allergies  Social History     Socioeconomic History   . Marital status: Married     Spouse name: Not on file   . Number of children: Not on file   . Years of education: Not on file   . Highest education level: Not on file    Occupational History   .  Not on file   Tobacco Use   . Smoking status: Never Smoker   . Smokeless tobacco: Never Used   Substance and Sexual Activity   . Alcohol use: Yes     Comment: 1-2 glasses of wine daily    . Drug use: No   . Sexual activity: Yes     Partners: Female     Birth control/protection: Post-menopausal   Other Topics Concern   . Not on file   Social History Narrative   . Not on file     Social Determinants of Health     Financial Resource Strain:    . Difficulty of Paying Living Expenses:    Food Insecurity:    . Worried About Programme researcher, broadcasting/film/video in the Last Year:    . Barista in the Last Year:    Transportation Needs:    . Freight forwarder (Medical):    Marland Kitchen Lack of Transportation (Non-Medical):    Physical Activity:    . Days of Exercise per Week:    . Minutes of Exercise per Session:    Stress:    . Feeling of Stress :    Social Connections:    . Frequency of Communication with Friends and Family:    . Frequency of Social Gatherings with Friends and Family:    . Attends Religious Services:    . Active Member of Clubs or Organizations:    . Attends Banker Meetings:    Marland Kitchen Marital Status:    Intimate Partner Violence:    . Fear of Current or Ex-Partner:    . Emotionally Abused:    Marland Kitchen Physically Abused:    . Sexually Abused:      Family History   Problem Relation Age of Onset   . Alzheimer's disease Mother    . Heart attack Father    . Stroke Paternal Grandfather        Review of Systems  CONST: No weight change, no fevers/chills or sweats, fatigue, muscle aches  EYES: No blurry vision, double vision, loss of peripheral vision  EARS: No hearing loss, tinnitus, pain or discharge.   NOSE: No congestion, runny nose or bloody nose  MOUTH: No sore throat, oral lesions, difficulty swallowing, no change in character of voice  NECK: No swollen glands, stiffness or pain  CV: No chest pain, palpitations, leg swelling  RESP: No shortness of breath, cough, wheeze, asthma  GI: No n/v,  diarrhea, constipation, abd pain, heartburn, no change in caliber/color of stool  MALE GU: No dysuria, frequency, hematuria, nocturia, testicular changes, penile discharge, erectile dysfunction  MSK:  No joint or muscle pain, swelling or weakness  SKIN:   No rashes lumps, sores or concerning moles  ENDO:  No heat/cold intolerance, polyuria, polydipsia, polyphagia  HEME:  No bruising/bleeding, swollen glands  NEURO:  No headache, lightneadedness, dizziness, loss of consciousness, numbness/tingling, weakness  PSYCH:  No depressed mood, anhedonia, anxiety    Physical Exam:  BP (!) 165/98 (BP Site: Right arm, Patient Position: Sitting, Cuff Size: Medium)   Pulse 70   Resp 16   Ht 1.88 m (6\' 2" )   Wt 87.6 kg (193 lb 1.6 oz)   SpO2 99%   BMI 24.79 kg/m    Wt Readings from Last 3 Encounters:   12/06/19 87.6 kg (193 lb 1.6 oz)   04/03/19 89.4 kg (197 lb)   02/20/19 88.5 kg (195 lb 3.2 oz)     GEN:  well developed well nourished male alert and appropriate in no apparent distress  HENT: tympanic membrane normal bilaterally, no nasal congestion, oropharynx normal w no exudate or erythema  EYES: PERRL, EOMI, no pallor or scleral icterus, normal conjunctiva  NECK: supple, no thyromegaly or nodules appreciated  LYMPH: no cervical or supraclavicular lymphadenopathy appreciated  CV: RRR, no m/r/g.  No LE edema.  2+ radial pulses present and equal.  RESP: CTAB, normal effort, no rhonchi or rales  GI: soft, nontender/ nondistended, NABS, no rebound or guarding, no evident hepatosplenomegaly  GU: normal external genitalia no masses or hernia evident or palpable  SKIN: warm, dry.  no visible rashes or concerning moles  MSK: Normal bulk and tone, normal strength 5/5 and sensation UE / LE  NEURO: PERRLA, EOMI, face symmetric, hearing intact to voice, palate raise, shoulder shrug and neck flexion intact, tongue midline. Moves all extremities.  PSYCH: normal mood and affect    Clinical Support on 12/06/2019   Component Date Value Ref  Range Status   . Urine Type 12/06/2019 Clean Catch   Final   . Color, UA 12/06/2019 YELLOW  Colorless - Yellow Final   . Clarity, UA 12/06/2019 CLEAR  Clear - Hazy Final   . Specific Gravity UA 12/06/2019 1.019  1.001 - 1.035 Final   . Urine pH 12/06/2019 5.5  5.0 - 8.0 Final   . Leukocyte Esterase, UA 12/06/2019 NEGATIVE  Negative Final   . Nitrite, UA 12/06/2019 NEGATIVE  Negative Final   . Protein, UR 12/06/2019 30* Negative Final   . Glucose, UA 12/06/2019 NEGATIVE  Negative Final   . Ketones UA 12/06/2019 15* Negative Final   . Urobilinogen, UA 12/06/2019 0.2  0.2 - 2.0 Final   . Bilirubin, UA 12/06/2019 NEGATIVE  Negative Final   . Blood, UA 12/06/2019 NEGATIVE  Negative Final   . Prostate Specific Antigen, Total 12/06/2019 1.317  0.000 - 4.000 ng/mL Final   . Vitamin D, 25 OH, Total 12/06/2019 12* 30 - 100 ng/mL Final   . TSH 12/06/2019 2.72  0.35 - 4.94 uIU/mL Final   . Cholesterol 12/06/2019 158  0 - 199 mg/dL Final   . Triglycerides 12/06/2019 113  34 - 149 mg/dL Final   . HDL 05/22/2535 56  40 - 9,999 mg/dL Final   . LDL Calculated 12/06/2019 79  0 - 99 mg/dL Final   . VLDL Calculated 12/06/2019 23  10 - 40 mg/dL Final   . Cholesterol / HDL Ratio 12/06/2019 2.8  See Below Final   . Glucose 12/06/2019 112* 70 - 100 mg/dL Final   . BUN 64/40/3474 12.0  9.0 - 28.0 mg/dL Final   . Creatinine 25/95/6387 0.8  0.5 - 1.5 mg/dL Final   . Sodium 56/43/3295 142  136 - 145 mEq/L Final   . Potassium 12/06/2019 3.9  3.5 - 5.1 mEq/L Final   . Chloride 12/06/2019 107  100 - 111 mEq/L Final   . CO2 12/06/2019 24  21 - 29 mEq/L Final   . Calcium 12/06/2019 9.5  7.9 - 10.2 mg/dL Final   . Protein, Total 12/06/2019 7.0  6.0 - 8.3 g/dL Final   . Albumin 18/84/1660 4.2  3.5 - 5.0 g/dL Final   . AST (SGOT) 63/07/6008 24  5 - 34 U/L Final   . ALT 12/06/2019 26  0 - 55 U/L Final   . Alkaline Phosphatase 12/06/2019 60  38 - 106 U/L Final   . Bilirubin, Total 12/06/2019 2.3* 0.2 -  1.2 mg/dL Final   . Globulin 16/04/9603 2.8  2.0 -  3.7 g/dL Final   . Albumin/Globulin Ratio 12/06/2019 1.5  0.9 - 2.2 Final   . Anion Gap 12/06/2019 11.0  5.0 - 15.0 Final   . WBC 12/06/2019 5.40  3.10 - 9.50 x10 3/uL Final   . Hgb 12/06/2019 15.5  12.5 - 17.1 g/dL Final   . Hematocrit 54/03/8118 47.8  37.6 - 49.6 % Final   . Platelets 12/06/2019 265  142 - 346 x10 3/uL Final   . RBC 12/06/2019 5.06  4.20 - 5.90 x10 6/uL Final   . MCV 12/06/2019 94.5  78.0 - 96.0 fL Final   . MCH 12/06/2019 30.6  25.1 - 33.5 pg Final   . MCHC 12/06/2019 32.4  31.5 - 35.8 g/dL Final   . RDW 14/78/2956 13  11 - 15 % Final   . MPV 12/06/2019 9.9  8.9 - 12.5 fL Final   . Neutrophils 12/06/2019 76.2  None % Final   . Lymphocytes Automated 12/06/2019 14.1  None % Final   . Monocytes 12/06/2019 7.8  None % Final   . Eosinophils Automated 12/06/2019 1.3  None % Final   . Basophils Automated 12/06/2019 0.2  None % Final   . Immature Granulocytes 12/06/2019 0.4  None % Final   . Nucleated RBC 12/06/2019 0.0  0.0 - 0.0 /100 WBC Final   . Neutrophils Absolute 12/06/2019 4.12  1.10 - 6.33 x10 3/uL Final   . Lymphocytes Absolute Automated 12/06/2019 0.76  0.42 - 3.22 x10 3/uL Final   . Monocytes Absolute Automated 12/06/2019 0.42  0.21 - 0.85 x10 3/uL Final   . Eosinophils Absolute Automated 12/06/2019 0.07  0.00 - 0.44 x10 3/uL Final   . Basophils Absolute Automated 12/06/2019 0.01  0.00 - 0.08 x10 3/uL Final   . Immature Granulocytes Absolute 12/06/2019 0.02  0.00 - 0.07 x10 3/uL Final   . Absolute NRBC 12/06/2019 0.00  0.00 - 0.00 x10 3/uL Final   . Hemolysis Index 12/06/2019 3  0 - 18 Final   . EGFR 12/06/2019 >60.0   Final   . RBC, UA 12/06/2019 0-2  0 - 5 /hpf Final   . WBC, UA 12/06/2019 0-5  0 - 5 /hpf Final   . Squamous Epithelial Cells, Urine 12/06/2019 0-5  0 - 25 /hpf Final   . Urine Bacteria 12/06/2019 NONE SEEN  Rare /hpf Final   . Hyaline Casts, UA 12/06/2019 0-5  0 - 5 /lpf Final         Audiology test:  reviewed results with patient   Vision test:  reviewed results with patient    EKG NSR 70  InBody: Reviewed results of InBody with patient - weight 193.1 notable for BMI 24.8 Visceral Fat 8 and Percentage Body Fat 19.2%  Epworth Sleepiness Scale: 0    Assessment/Plan:  1. Medicare annual wellness visit, subsequent      2. Hypertensive heart disease without heart failure     - CT Cardiac Scoring; Future    3. Mixed hyperlipidemia    - CT Cardiac Scoring; Future    4. Vitamin D deficiency      5. Benign prostatic hyperplasia with nocturia  USPSTF guidelines on PSA testing    6. Dupuytren's contracture of left hand      7. Hyperacusis of both ears        Risks & benefits of the new medication(s) were explained to the patient, who appeared  to understand and agrees to the treatment plan.    [name], MD  10:41 AM 12/06/2019    [facility]  [address]  [place], [place] [address]  P) [phone]  F) [phone]  [email or web address]

## 2019-12-12 ENCOUNTER — Other Ambulatory Visit: Payer: Self-pay | Admitting: Internal Medicine

## 2019-12-12 DIAGNOSIS — E559 Vitamin D deficiency, unspecified: Secondary | ICD-10-CM

## 2019-12-12 MED ORDER — ERGOCALCIFEROL 1.25 MG (50000 UT) PO CAPS
50000.00 [IU] | ORAL_CAPSULE | ORAL | 0 refills | Status: DC
Start: 2019-12-12 — End: 2020-06-06

## 2019-12-12 NOTE — Progress Notes (Signed)
Dear Frederick [name],    It was a pleasure meeting with you as always. I thoroughly enjoyed our conversation. I would like to discuss your recent lab work:    - PSA 1.317 normal range ( 0 - 4.0)  - Urinalysis - normal no evidence of infection   - CBC and differential - normal no evidence of infection or inflammation  - Electrolytes and kidney function are normal  - Liver functions are normal  - Fasting blood sugar is mildly elevated 112 normal < 100 however HbA1c  another measure for Diabetes I would check at our next office visit. In the meantime, I would recommend to manage with diet increase soluable fibers like greens and stalk vegetables and decrease grains like pasta, rice and bread   - TSH - normal (thyroid)  - Vitamin D,25 OH, Total - normal 30 or greater  Yours was 12 very low recommend prescription supplement I will send to the pharmacy       Vit D repletion  Treat as Vit D is important for Fatigue, hair, skin and nails, bone health and to keep in remission from depression, also Vit D >50  reported to decrease risk of CV disease, endocrine hormone often found low in obese patients , treatment to prevent osteoporosis    Treatment options  Buy Vit D 3 (active) form also converted to active (Vit D2 to D3 form) from [employer]    Vit D can be found in foods: Dairy, non-dairy milk/cheese/yogurt, cereal, bread, OJ and fish recommended to take 6 servings daily to meet the RDA standards dose for Vit D/Calcium OR can take OTC supplements    Repletion TX: Vit D <20 take Vit D3 50,000IU weekly x 12 weeks (prescription)     FYI after treatment should take Vit D for Life to maintain the levels  Maintenance TX after repletion if Vit D >30  will transition down to 400 IU daily  for life as Vit D is fat soluble do not want to over treat but similarly studies show once low should take for life, I also remind patients to take with Calcium 1200 mg daily    Finally reminder to do weight bearing exercises like yoga, pilates,  weightlifting need the stretch and pull of muscle off the bone for better absorption    - Total cholesterol is 158 (goal < 200) triglycerides are 113 (goal < 150), HDL or good cholesterol is 56 (goal >40) LDL or bad    cholesterol is 79 (numeric goal depends on overall cardiac risk estimation)    The 10-year ASCVD risk score (Goff [place] Jr., et al., 2013) is: 35% because of age not your cholesterol numbers    Values used to calculate the score:      Age: 74 years      Sex: Male      Is Non-Hispanic African American: No      Diabetic: No      Tobacco smoker: No      Systolic Blood Pressure: 165 mmHg      Is BP treated: Yes      HDL Cholesterol: 56 mg/dL      Total Cholesterol: 158 mg/dL     Discussed    Athero Sclerotic Cardio Vascular Disease risk score ( ASCVD) calculated and your ASCVD = 35%  ASCVD was >7.5% which means optimizing medications  the [employer] of Cardiology would recommend increasing atorvastatin to 20 mg but I am fine with keeping it at  this level as LDL goal is <70 and your is 79 ( close enough)      Recommend diet management avoid fats, meats and carbs and increase fish, fiber and fruit and plant based diet, and exercise CV 30 minutes daily  Increase plant based protein for example broccoli, cauliflower, soy, tofu, Quinoa, lentils    Also implement Flax/Fish oil tabs to be supportive  2 alternative options to prescription medications are the following:    1.Flax seeds ground 2 tbsp daily which can reduce LDL and increase HDL by 25 %    OR    2.DHA/EPA Omega FA Fish oil tabs 1200-3600 mg tabs ( 1 tab =600 mg , so that is 1-2 tabs with each meal) also supposed to increase HDL    recommend to repeat Lipids fasting in 3 months    Normal healthy guidelines    Regular exercise 45-60 min 4-6 times a week  Limit white foods that are high in carbohydrates: bread, rice, potatoes, pasta, sugar, and flour.  Low fat, low cholesterol diet  Colonoscopy to screen for colon cancer by age 39, and as recommended  thereafter based on your results  See the dentist every 6-12 months  Safety guidelines wear seatbelts and sunscreen and sunglasses and hat to protect from sun exposure  Wear sunscreen for exposures over 15 min.- recommend 30-70 proof  Wear seatbelts whether driving or a passenger.  Do not drink alcohol and drive, or ride in a vehicle with another driver who has been drinking.  Maintain a healthy weight. Your BMI should be 25 or less  BMI > 25 means you are clinically overweight  BMI > 30 means you are clinically obese    Men:  PSA anually > 34 yo  Advanced directive recommend to file with PMD and can supply for [place] to keep on chart    Please let me know if you have any concerns or questions.    [name], MD  3:13 PM 12/12/2019    [facility]  [address]  [place], [place] [address]  P) [phone]  F) [phone]  [email or web address]

## 2019-12-12 NOTE — Progress Notes (Signed)
Reviewed labs very low Vit D 12 will prescribe Vit d 50,000 weekly x 12 weeks sent to mail order pharmacy    [name], MD  3:25 PM 12/12/2019    [facility]  [address]  [place], [place] [address]  P) [phone]  F) [phone]  [email or web address]

## 2019-12-20 ENCOUNTER — Other Ambulatory Visit: Payer: Self-pay

## 2019-12-20 ENCOUNTER — Other Ambulatory Visit: Payer: Self-pay | Admitting: Internal Medicine

## 2019-12-20 DIAGNOSIS — E782 Mixed hyperlipidemia: Secondary | ICD-10-CM

## 2019-12-20 DIAGNOSIS — I119 Hypertensive heart disease without heart failure: Secondary | ICD-10-CM

## 2019-12-21 ENCOUNTER — Other Ambulatory Visit: Payer: Self-pay | Admitting: Internal Medicine

## 2019-12-21 ENCOUNTER — Encounter: Payer: Self-pay | Admitting: Internal Medicine

## 2019-12-21 DIAGNOSIS — E782 Mixed hyperlipidemia: Secondary | ICD-10-CM

## 2019-12-21 NOTE — Progress Notes (Signed)
Frederick [name],     I just left you a VM at the house  Nothing to be alarmed about but the Coronary Calcium score is high 654 and a bulk of the calcium is in one main vessel Left Anterior Descending.    Although your cholesterol numbers are very good, I am going to recommend you see a cardiologist to make sure this is not an indication of significant blockage in one vessel that needs to be addressed.     In the meantime, I would recommend we increase Lipitor to 20 mg and start a baby Aspirin 81 mg enteric coated daily.    I will refer you to Dr. Ernestine Mcmurray at [facility] where I work. I'll send you the referral thru mycharts but if you have any issues just call the office and they can email it to you or help make the appointment    I am out of the office for the week and will return June 7 we can discuss it then but if you should need assistance feel free to discuss with Dr Cyndia Bent he is covering for me and quite knowledgeable in this area.    [name], MD  10:03 AM 12/21/2019    [facility]  [address]  [place], [place] [address]  P) [phone]  F) [phone]  [email or web address]

## 2019-12-21 NOTE — Progress Notes (Signed)
Coronary Calcium score is high 654 and a bulk of the calcium is in one main vessel Left Anterior Descending.    Although your cholesterol numbers are very good, I am going to recommend you see a cardiologist to make sure this is not an indication of significant blockage in one vessel that needs to be addressed.    In the meantime, I would recommend we increase Lipitor to 20 mg and start a baby Aspirin 81 mg enteric coated daily.    I will refer you to Dr. Ernestine Mcmurray at [facility] where I work. I'll send you the referral thru mycharts but if you have any issues just call the office and they can email it to you or help make the appointment    I am out of the office for the week and will return June 7 we can discuss it then but if you should need assistance feel free to discuss with Dr Cyndia Bent he is covering for me and quite knowledgeable in this area.    [name], MD  10:08 AM 12/21/2019    [name]  [address]  [place], [place] [address]  P) [phone]  F) [phone]  [email or web address]

## 2020-01-01 ENCOUNTER — Encounter (INDEPENDENT_AMBULATORY_CARE_PROVIDER_SITE_OTHER): Payer: Self-pay

## 2020-01-22 ENCOUNTER — Ambulatory Visit (INDEPENDENT_AMBULATORY_CARE_PROVIDER_SITE_OTHER): Payer: Medicare Other | Admitting: Cardiovascular Disease

## 2020-01-22 ENCOUNTER — Encounter (INDEPENDENT_AMBULATORY_CARE_PROVIDER_SITE_OTHER): Payer: Self-pay | Admitting: Cardiovascular Disease

## 2020-01-22 VITALS — BP 165/88 | HR 70 | Ht 74.0 in | Wt 191.6 lb

## 2020-01-22 DIAGNOSIS — I10 Essential (primary) hypertension: Secondary | ICD-10-CM | POA: Insufficient documentation

## 2020-01-22 DIAGNOSIS — I2510 Atherosclerotic heart disease of native coronary artery without angina pectoris: Secondary | ICD-10-CM | POA: Insufficient documentation

## 2020-01-22 DIAGNOSIS — R011 Cardiac murmur, unspecified: Secondary | ICD-10-CM

## 2020-01-22 DIAGNOSIS — E782 Mixed hyperlipidemia: Secondary | ICD-10-CM

## 2020-01-22 DIAGNOSIS — E785 Hyperlipidemia, unspecified: Secondary | ICD-10-CM

## 2020-01-22 HISTORY — DX: Cardiac murmur, unspecified: R01.1

## 2020-01-22 NOTE — Progress Notes (Signed)
[name] OFFICE CONSULTATION    I had the pleasure of seeing Frederick [name] today for abnormal coronary calcium score.    He is 74 year old gentleman with recent annual physical and coronary calcium score obtained and abnormal.  He does have a history of hypertension with whitecoat component but blood pressure at home is under good control.  Long history of hyperlipidemia on Lipitor for 20 years or so.    Clinically he denies any anginal type symptoms and no undue shortness of breath or heart failure symptoms.  Had been doing a lot of exercise but has spinal stenosis which now limits his activity level.  Blood pressure at home usually 130/60-70 range.  Says he was told of Barlow's many years ago and had a stress test done as well.    PAST MEDICAL HISTORY: He has a past medical history of Diverticulitis, Hearing loss, Hyperacusis of both ears, Hyperlipidemia, Hypertension, Insomnia, Psoriasis, and Tinnitus (1993). 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    MEDICATIONS:   Current Outpatient Medications   Medication Sig   . amLODIPine (NORVASC) 5 MG tablet Take 1 tablet (5 mg total) by mouth daily Already spoke with pharmacist this is a new medication trial of 30 day DR Sherilyn Cooter   . atorvastatin (LIPITOR) 10 MG tablet Take 1 tablet (10 mg total) by mouth daily   . ergocalciferol (ERGOCALCIFEROL) 1.25 MG (50000 UT) capsule Take 1 capsule (50,000 Units total) by mouth once a week for 12 doses   . eszopiclone (LUNESTA) 2 MG tablet TAKE 1 TABLET NIGHTLY      IMMEDIATELY BEFORE BEDTIME   . losartan (COZAAR) 100 MG tablet Take 1 tablet (100 mg total) by mouth daily (Patient taking differently: Take 100 mg by mouth nightly   )         ALLERGIES: No Known Allergies    FAMILY HISTORY: History reviewed.  Father had bypass surgery at 69 and died of a heart attack at 44.  Mother died 74.    SOCIAL HISTORY: He reports that he has never smoked. He has never used smokeless tobacco. He reports current alcohol use. He reports that he does not use drugs.    REVIEW OF SYSTEMS: Weight stable. No fatigue. No asthma, wheezing, or lung problem.  No ulcer, gall bladder or reflux. No kidney problems. No thyroid history. Neurologic negative. No anemia or bleeding. No claudication.  All other systems were reviewed and negative except as mentioned above.    PHYSICAL EXAMINATION  General Appearance:  A well-appearing male in no acute distress.    Vital Signs: BP 165/88 (BP Site: Left arm)   Pulse 70   Ht 1.88 m (6\' 2" )   Wt 86.9 kg (191 lb 9.6 oz)   SpO2 98%   BMI 24.60 kg/m    HEENT: Sclera anicteric, conjunctiva without pallor, moist mucous membranes, normal dentition. No arcus.   Neck:  Supple without jugular venous distention. Thyroid nonpalpable. Normal carotid upstrokes without bruits.   Chest: Clear to auscultation bilaterally with good air movement and respiratory effort and no wheezes, rales, or rhonchi   Cardiovascular: Normal S1 and physiologically split S2, grade 1-2 crescendo-decrescendo systolic murmur at base and left sternal border, no AI murmur, no gallops or rub.  PMI of normal size and nondisplaced.  Abdomen: Soft, nontender,  nondistended, with normoactive bowel sounds. No organomegaly.  No pulsatile masses, or bruits.   Extremities: Warm without edema, clubbing, or cyanosis. All peripheral pulses are full and equal.   Skin: No rash, xanthoma or xanthelasma.   Neuro: Alert and oriented x3. Grossly intact. Strength is symmetrical. Normal mood and affect.     ECG: 12/06/2019.  Normal sinus rhythm.  Normal EKG.    LABS: 12/06/2019.  Cholesterol 158, triglyceride 113, HDL 56, LDL 79.  Comprehensive metabolic panel normal except glucose 112.  CBC  normal.  TSH normal.  GFR greater than 60.    ECHOCARDIOGRAM: Ordered.      CORONARY CALCIUM SCORE; 12/20/2019.  Total score 654.  Left main 117, LAD 409, left circumflex 83, right coronary 45, posterior descending 0.    IMPRESSION/RECOMMENDATIONS:    He is asymptomatic cardiac wise with no angina or heart failure symptoms but does have risk factors of hypertension and hyperlipidemia.  Coronary calcium score elevated primarily in the left anterior descending coronary.    He does have a basal systolic murmur probably some aortic sclerosis from age and hypertension but I did order an echocardiogram to assess the aortic valve, left ventricular function and the mitral valve.  Do not hear any click or late systolic murmur to suggest prolapse.    He does have whitecoat syndrome and blood pressure at home seems to be under adequate control at this point so no change in medication.    I discussed the coronary calcium score with patient and his wife.  I recommended performing a Lexiscan nuclear study to assess for any underlying ischemic problem and hemodynamically significant obstruction.    Although his lipids are quite good with the presence of coronary calcium national guidelines recommend LDL level under 70 and as low as possible and therefore I asked him to increase Lipitor to 20 mg daily with a follow-up lipid panel in 3 months.  Lower the cholesterol and LDL levels are better.  Wife a little concerned about neurologic and diabetic side effects but benefits outweigh risks.  Also given coronary calcification recommended daily baby aspirin.    *This note was generated by the Epic EMR system/ Dragon speech recognition and may contain inherent errors or omissions not intended by the user. Grammatical errors, random word insertions, deletions, pronoun errors and incomplete sentences are occasional consequences of this technology due to software limitations. Not all errors are caught or corrected. If there are questions or  concerns about the content of this note or information contained within the body of this dictation they should be addressed directly with the author for clarification.*

## 2020-01-22 NOTE — Patient Instructions (Addendum)
Echocardiogram    Lexiscan Nuclear stress test    Aspirin 81 mg daily    Increase lipitor to 20 mg daily, repeat lipid panel in 3 months

## 2020-01-24 ENCOUNTER — Inpatient Hospital Stay
Admission: RE | Admit: 2020-01-24 | Discharge: 2020-01-24 | Disposition: A | Discharge status: Home / Self Care | Payer: Medicare Other | Source: Ambulatory Visit | Attending: Cardiovascular Disease | Admitting: Cardiovascular Disease

## 2020-01-24 DIAGNOSIS — R011 Cardiac murmur, unspecified: Secondary | ICD-10-CM | POA: Insufficient documentation

## 2020-01-24 DIAGNOSIS — R931 Abnormal findings on diagnostic imaging of heart and coronary circulation: Secondary | ICD-10-CM

## 2020-01-24 DIAGNOSIS — I2510 Atherosclerotic heart disease of native coronary artery without angina pectoris: Secondary | ICD-10-CM | POA: Insufficient documentation

## 2020-01-24 DIAGNOSIS — R0602 Shortness of breath: Secondary | ICD-10-CM | POA: Insufficient documentation

## 2020-01-24 HISTORY — DX: Abnormal findings on diagnostic imaging of heart and coronary circulation: R93.1

## 2020-01-24 MED ORDER — TECHNETIUM TC 99M TETROFOSMIN IV KIT
35.00 | PACK | Freq: Once | INTRAVENOUS | Status: AC | PRN
Start: 2020-01-24 — End: 2020-01-24
  Administered 2020-01-24: 14:00:00 35 via INTRAVENOUS
  Filled 2020-01-24: qty 100

## 2020-01-24 MED ORDER — REGADENOSON 0.4 MG/5ML IV SOLN
0.40 mg | Freq: Once | INTRAVENOUS | Status: AC | PRN
Start: 2020-01-24 — End: 2020-01-24
  Administered 2020-01-24: 14:00:00 0.4 mg via INTRAVENOUS

## 2020-01-24 MED ORDER — TECHNETIUM TC 99M TETROFOSMIN IV KIT
10.00 | PACK | Freq: Once | INTRAVENOUS | Status: AC | PRN
Start: 2020-01-24 — End: 2020-01-24
  Administered 2020-01-24: 12:00:00 10 via INTRAVENOUS
  Filled 2020-01-24: qty 100

## 2020-01-24 MED ORDER — REGADENOSON 0.4 MG/5ML IV SOLN
INTRAVENOUS | Status: AC
Start: 2020-01-24 — End: ?
  Filled 2020-01-24: qty 5

## 2020-01-25 LAB — NM MYOCARDIAL PERFUSION SPECT (STRESS AND REST): Nuclear LV Ejection Fraction: 66

## 2020-01-29 ENCOUNTER — Encounter (INDEPENDENT_AMBULATORY_CARE_PROVIDER_SITE_OTHER): Payer: Self-pay

## 2020-01-30 ENCOUNTER — Telehealth: Payer: Self-pay | Admitting: Internal Medicine

## 2020-01-30 ENCOUNTER — Encounter (INDEPENDENT_AMBULATORY_CARE_PROVIDER_SITE_OTHER): Payer: Self-pay | Admitting: Neurological Surgery

## 2020-01-30 ENCOUNTER — Ambulatory Visit (INDEPENDENT_AMBULATORY_CARE_PROVIDER_SITE_OTHER): Payer: Medicare Other | Admitting: Neurological Surgery

## 2020-01-30 VITALS — BP 156/94 | HR 71 | Ht 74.0 in | Wt 188.0 lb

## 2020-01-30 DIAGNOSIS — M48061 Spinal stenosis, lumbar region without neurogenic claudication: Secondary | ICD-10-CM

## 2020-01-30 DIAGNOSIS — M545 Low back pain, unspecified: Secondary | ICD-10-CM

## 2020-01-30 DIAGNOSIS — M47816 Spondylosis without myelopathy or radiculopathy, lumbar region: Secondary | ICD-10-CM

## 2020-01-30 DIAGNOSIS — E559 Vitamin D deficiency, unspecified: Secondary | ICD-10-CM

## 2020-01-30 DIAGNOSIS — E782 Mixed hyperlipidemia: Secondary | ICD-10-CM

## 2020-01-30 DIAGNOSIS — M5136 Other intervertebral disc degeneration, lumbar region: Secondary | ICD-10-CM | POA: Insufficient documentation

## 2020-01-30 DIAGNOSIS — G8929 Other chronic pain: Secondary | ICD-10-CM

## 2020-01-30 MED ORDER — ERGOCALCIFEROL 1.25 MG (50000 UT) PO CAPS
50000.00 [IU] | ORAL_CAPSULE | ORAL | 0 refills | Status: AC
Start: 2020-01-30 — End: 2020-04-17

## 2020-01-30 MED ORDER — ATORVASTATIN CALCIUM 20 MG PO TABS
20.0000 mg | ORAL_TABLET | Freq: Every evening | ORAL | 1 refills | Status: DC
Start: 2020-01-30 — End: 2020-07-09

## 2020-01-30 NOTE — Progress Notes (Signed)
[facility] Neurosurgery  Follow Up Patient Note    Primary Care Frederick [name]: Frederick Frederick [name], Frederick [name]     MRN: [id number]      HPI   Chief Complaint:  Follow up on chronic low back pain    HPI    Ac Frederick [name] is a 74 y.o. 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 He describes constant pain across the beltline, worse on the right side, radiating into the hip and groin with associated tingling. The pain worsens with activity, and he has been unable to play golf for the past year. He denies pain radiating into the legs, weakness, numbness or loss of bowel/bladder control. He has been managing with home exercise and stretching, occasionally Tylenol. At last visit on 04/03/19 he was referred for MRI of the lumbar spine and pain management with Dr. Reva Bores. He has had 3 epidural injections and 2 radiofrequency ablations with no relief. He has not had physical therapy.       Impression   Frederick Frederick [name] comes for follow-up with his wife after several cycles of interventional pain management and injections by Dr. Reva Bores.      He continues to complain about pain that is becoming refractory to the injections.  The pain is specifically bilaterally in the hip area and goes across the back and the axial nature.  It occasionally radiates to the hip and to the inguinal region.  The pain never travels below the buttocks.  Right is worse than left.  Both sides are painful.  He is quite debilitated from this.  He is unable to work out and do cardio.  The pain is worse when he stands moves or walks etc.    I had a very lengthy and frank discussion with the patient in regards to their complaints.  I reviewed the MRI with them in great detail.  I answered all of their questions and concerns.  I used plastic spine models to delineate relevant anatomy and pathophysiology of their pain.  The patient expressed a firm  understanding of our conversation and understood the reasoning and rationale of our plan.  All questions were addressed to their satisfaction.  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  The previous levels that are likely cause of the symptoms are L2-3 and L3-4 with significant facet arthropathy and central by foraminal stenosis at these levels.  The patient is interested in surgical option.  Given the patient's pain and MRI, I recommend a decompression only specifically L2-3 and L3-4.  Decompression of the nerve roots will help with the patient's hip and inguinal pain.  Which time he may develop more mechanical pain at which time may require a fusion in the future.  But for now, I recommend a two-level decompressive lumbar laminectomy.  Hopefully the surgery will be enough to give him a positive quality of life for quite some time.  The patient is very interested in this.    I discussed with the patient the risks, benefits, alternatives of this surgical procedure.  The risks include but are not limited to devastating neurologic injury, vascular injury, infection, CSF leak, reoperation for any reason, anesthetic complications, pain requiring future surgery.  All questions were addressed and the patient agrees to proceed.    I have given the  patient a physical therapy prescription for starting therapy pre and post surgery.        Plan   1. I discussed with the patient the nature, indications, risks and alternatives of elective surgery with bilateral L2-3 and L3-4 laminectomies. All questions were answered. At this time our decision is to proceed with surgery  2. Refer to PT  3. Pre-op medical clearance   4.  Other surgical opinions encouraged          Again, many thanks for allowing me to participate in the care of this patient.  I will keep you apprised of their progress.  Please call me with any  questions.    Sincerely,    Frederick Frederick [name]    Neurosurgery  [phone]  (cell)  [phone]  (Freder[facility] Frederick [name])   Frederick Frederick [name], Frederick Frederick [name]   ([email or web address])        Follow-up   No follow-ups on file.     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    Psoriasis     Tinnitus 1993        Surgical History     Past Surgical History:   Procedure Laterality Date    APPENDECTOMY  1973    COLONOSCOPY  02/2011    COLONOSCOPY N/A 08/24/2016    Procedure: COLONOSCOPY;  Surgeon: Frederick Frederick [name], Frederick [name];  Location: [facility];  Service: Gastroenterology;  Laterality: N/A;  COLONOSCOPY    EXCISION, CYST  12/2006    back    KNEE ARTHROSCOPY Left 09/2011    RELEASE, 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    RELEASE, 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    ROTATOR CUFF REPAIR Left 09/2000    and shaving of bone spur        Family History     Family History   Problem Relation Age of Onset    Alzheimer's disease Mother     Heart attack Father     Stroke Paternal Grandfather         Social History     Social History     Tobacco Use    Smoking status: Never Smoker    Smokeless tobacco: Never Used   Substance Use Topics    Alcohol use: Yes     Comment: 1-2 glasses of wine daily         Current Medications       Current Outpatient Medications:     amLODIPine (NORVASC) 5 MG tablet, Take 1 tablet (5 mg total) by mouth daily Already spoke with pharmacist this is a new medication trial of 30 day DR Frederick Frederick [name], Disp: 90 tablet, Rfl: 1    aspirin EC 81 MG EC tablet, Take 81 mg by mouth daily, Disp: , Rfl:     atorvastatin (LIPITOR) 10 MG tablet, Take 1 tablet (10 mg total) by  mouth daily (Patient taking differently: Take 20 mg by mouth daily  ), Disp: 90 tablet, Rfl: 3    ergocalciferol (ERGOCALCIFEROL) 1.25  MG (50000 UT) capsule, Take 1 capsule (50,000 Units total) by mouth once a week for 12 doses, Disp: 12 capsule, Rfl: 0    eszopiclone (LUNESTA) 2 MG tablet, TAKE 1 TABLET NIGHTLY      IMMEDIATELY BEFORE BEDTIME, Disp: 90 tablet, Rfl: 1    losartan (COZAAR) 100 MG tablet, Take 1 tablet (100 mg total) by mouth daily (Patient taking differently: Take 100 mg by mouth nightly  ), Disp: 90 tablet, Rfl: 3     Allergies   No Known Allergies     Review of Systems   Review of Systems   Constitutional: Negative.    HENT: Negative.    Eyes: Negative.    Respiratory: Negative.    Cardiovascular: Negative.    Gastrointestinal: Negative.    Genitourinary: Negative.    Musculoskeletal: Positive for back pain and myalgias.   Neurological: Positive for tingling. Negative for sensory change and focal weakness.   Endo/Heme/Allergies: Negative.    Psychiatric/Behavioral: Negative.      Physical Examination   VITAL SIGNS:    Vitals:    01/30/20 0936   BP: (!) 156/94   Pulse: 71     General:  Well developed, well nourished, no apparent distress  Neck:  Supple, no JVD, no apparent lymphadenopathy  HEENT:  Head normocephalic, atraumatic, no obvious lesions in ear, nose or throat  Chest:  Equal chest rise.  No wheezes, rales or rhonchi.  Skin:  No obvious lesions or scars  Extremities:  Without clubbing or cyanosis    Neurologic Exam    Awake, alert, oriented x3, Follows commands  GCS: 15   Speech is clear  Attention span and concentration: intact  Recent and remote memory: intact    Gait Intact  Able to walk on toes and heels    Point tenderness: None   FROM lumbar spine    Motor Exam:                                                    R          L            L2-3     Iliopsoas (Hip Flexion)                        5          5  L3-4     Quadriceps (Knee Extension)             5          5  L5-S1    Hamstring (Knee Flexion)                   5          5  L4-5     Tibialis Anterior (Foot Dorsiflexion)    5          5  S1        Gastrocsoleus (Plantar Flexion)         5          5  L5        EHL (Toe Dorsiflexion)                       5  5      Sensation intact bilaterally                Reflexes:           Patellar (L3-4): right  2+, left 2+  Achilles (L5-S1): right 2+, left 2+  Clonus: right negative, left negative   Babinski: right negative, left negative     Straight leg raise:  right negative, left negative     Radiology Interpretation   04/04/19 MRI L-spine w/o contrast:    1. 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 Active discogenic bone marrow edema in the endplates surrounding T10-T11, L2-L3 and L3-4 disc spaces. In addition, diffuse bone marrow edema in the superior half of L4 vertebral body could represent superimposed stress reaction or early stress fracture.  2. Mild multifactorial central spinal canal narrowing at L2-3 and L3-4.   3. At L4-5: Mild right posterolateral disc-osteophyte complex slightly abuts the right exiting L4 nerve roots within the lateral to the foramen.  4. At L5-S1: Mild broad-based posterolateral disc-osteophyte complex is slightly abuts the bilateral exiting L5 nerve roots, left worse than right.    The images above were personally reviewed and discussed in detail with the patient.         Frederick Mages, Frederick [name]    All relevant and clinical information was transcribed by me, Frederick Frederick [name], acting as a scribe for Dr Rayann Heman.

## 2020-01-30 NOTE — Telephone Encounter (Signed)
Can you call him and let them know I sent in the prescriptions Lipitor 20 mg 90 days and Vit D and added a pt referral here at [facility] but it looks like Dr Rayann Heman also placed a referral and they may want to use his PT recommendations    If this location is too far let me know and we can find them one closer    [name], MD  3:20 PM 01/30/2020    [facility]  [address]  [place], [place] [address]  P) [phone]  F) [phone]  [email or web address]

## 2020-01-30 NOTE — Telephone Encounter (Signed)
Patient's wife called to report that they met with surgeon regarding his back pain. Surgeon requests physical therapy to address the pain before scheduling surgery. Can you provide a recommended PT provider and referral?

## 2020-01-31 ENCOUNTER — Encounter (INDEPENDENT_AMBULATORY_CARE_PROVIDER_SITE_OTHER): Payer: Self-pay

## 2020-01-31 ENCOUNTER — Encounter (INDEPENDENT_AMBULATORY_CARE_PROVIDER_SITE_OTHER): Payer: Self-pay | Admitting: Neurological Surgery

## 2020-02-01 ENCOUNTER — Inpatient Hospital Stay: Payer: Medicare Other | Attending: Physician Assistant | Admitting: Physical Medicine & Rehabilitation

## 2020-02-01 VITALS — BP 178/94

## 2020-02-01 DIAGNOSIS — M545 Low back pain, unspecified: Secondary | ICD-10-CM

## 2020-02-01 HISTORY — PX: OTHER SURGICAL HISTORY: SHX169

## 2020-02-01 NOTE — Progress Notes (Signed)
Name:Frederick [name] Age: 74 y.o.   Date of Service: 02/01/2020  Referring Physician: Gus Rankin, PA   Date of Injury: 01/30/2020  Date Care Plan Established/Reviewed: 02/01/2020  Date Treatment Started: 02/01/2020  End of Certification Date: 04/30/2020  Sessions in Plan of Care: 16  Surgery Date: No data was found      Visit Count: 1   Diagnosis:   1. Acute bilateral low back pain, unspecified whether sciatica present        Subjective     History of Present Illness   History of Present Illness: Frederick [name] is a pleasant 74 y.o. male who comes into  Physical Therapy today with complaints of low back pain. Symptoms began with long history of LBP from age 83 onward which worsened over last few years. Pain is constant ache across low back R>L, into R hip and R groin with tingling on R. Pt denies weakness, numbness, probs with bowel or bladder. Had MRI referral on 04/03/19, 3 epidural injections, 2 radiofrequency ablations; No pain relief with them. Pt states is currently scheduled for surgery on 12th of August. States is structural problem which started which he was 74 yo. Is a big believer in PT but believes this issue is structural. Is to the point now that every step hurts and had to stop activiites d/t pain. Stretching low back gives the most relief but has little carryover.   Aggravators include:walking, standing, yard work including raking, .  Pain decreases with curling low spine but not as much relief as used to have and for less duration.   Frederick [name] is to be able to be able to walk, do treadmill, swing a golf club and more without pain.     Functional Limitations (PLOF): Difficulty standing (Stand for 3 hours), sit to stands pain, walking (walking for > 4 mile), yard work difficult and painful (was able)    Outcome Measure   Tool Used/Details: FOTO  Score: 49 at IE  Predicted Functional Outcome: 61 visit 11    Pain   Current pain rating: 1  At best pain rating: 1  At worst pain rating: 5 (wtih decreased  activity)  Location: low back, R hip and groin    Social Support/Occupation      Occupation: Retired      Precautions: No data was found  Allergies: Patient has no known allergies.    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     Functional Strength   Sit to Stand: independent (Chair rise test: 10 in 30 sec)  Squat: full  Step-up: 8 inches  Heel Raises      Unilateral (Left): 16     Unilateral (Right): 16    Integumentary   no wound, lesion or rash noted  Range of Motion     AROM: Lumbar Spine   IE 02/01/20    Lumbar Flexion past floor to CMP joint    Lumbar Extension pain    (blank fields were intentionally left blank)    IE 02/01/20   Left AROM    Left PROM   Lumbar/Hip IE 02/01/20   Right AROM    Right PROM       Lumbar Rotation           Hip Flexion           Hip Extension           Hip Abduction           Hip Adduction           Hip IR           Hip ER       (blank fields were intentionally left blank)  Lumbar side bending IE: L 8 deg; R 10 deg        Strength     IE 02/01/20   Left Strength  Hip  MMT IE 02/01/20   Right   5  Hip Flexion 5      Hip Extension     4+  Hip Abduction 4+      Hip Adduction       Hip IR       Hip ER       Quadriceps       Hamstrings     (blank fields were intentionally left blank)    IE 02/01/20   Left Strength  Knee  MMT IE 02/01/20   Right   5  Knee Flexion 5    5  Knee Extension 5    (blank fields were intentionally left blank)    IE 02/01/20   Left  Strength  Ankle/Foot  MMT IE 02/01/20   Right   5  Ankle Dorsiflexion 5      Ankle Plantarflexion       Ankle Inversion       Ankle Eversion     (blank fields were intentionally left blank)     Left Right   Heel Week     Toe walk     Tranverse abdominals Negative Negative   (blank fields were intentionally left blank)    Palpation TTP to R QL.   Lumbar paraspinals hypertonic bilaterally.     Flexibility Frederick [name] IE: L and R: 29 cm table to tib tuberosity      Tests       Thoracic   Negative slump.   Lumbar/Pelvic Girdle/Sacrum   Negative slump.    Lumbar/Pelvic Girdle/Sacrum (Left)   Positive quadrant.  Negative passive SLR, passive lumbar instability and sacrum compression.    Lumbar/Pelvic Girdle/Sacrum (Right)   Positive: quadrant.  Negative: passive SLR, passibe lumbar instability and sacrum compression    Left Hip   Positive FABER.   Negative FADIR and scour.   SLR: Negative.     Right Hip   Positive FABER.   Negative FADIR and scour.   SLR: Negative.     Neurological Testing     Sensation     Lumbar   Left   Intact: light touch    Right   Intact: light touch    Hip   Left Hip   Intact: light touch    Right Hip   Intact: light touch    Reflexes   Left   Patellar (  L4): trace (1+)  Achilles (S1): trace (1+)    Right   Patellar (L4): trace (1+)  Achilles (S1): trace (1+)    CPAs IE: L1-5 painful and hypomobile  UPAs IE R 3-4 painful      BP: (!) 178/94       Pt reports white coat syndrome and noise both known to raise BP. Pt monitors at home and will take again later today.         ---      ---   Total Time   Untimed Minutes  45 minutes   Total Time  45 minutes        Assessment   Frederick [name] is a pleasant 74 y.o. male presenting with low back pain who requires Physical Therapy for the following:    IPTC Impairments: Pain that limits and interferes with functional ability  Impaired postural alignment  Decreased strength  Decreased soft tissue mobility    Pain located: low back, R hip and groin     Clinical presentation:  evolving - initial pain that is moving or expanding in location/intensity/type  Barriers to therapy: Patient's age - delayed healing  Mechanism of injury/illness/exacerbation acute exacerbation of chronic problem  Patient's level of motivation - reports preference for surgery and belief that physical therapy will not help in this instance due to structural nature of problem.   Functional Limitations (PLOF): Difficulty standing (Stand for 3 hours), sit to stands pain, walking (walking for > 4 mile), yard work difficult and painful (was able)  Prognosis: good  Plan   Visits per week: 2  Number of Sessions: 16  Direct One on One  16109: Therapeutic Exercise: To Develop Strength and Endurance, ROM and Flexibility  [id number]: Gait Training  60454: Neuromuscular Reeducation  97140: Manual Therapy techniques (mobilization, manipulation, manual traction) (Soft tissue mobilization to lumbar, R hip and regionally interdependent areas and Joint Mobs Grade 1-5 to lumbar spine, R hip and regionally interdependent joints)  97530: Therapeutic Activities: Dynamic activities to improve functional performance  97035: Ultrasound  Dry Needling  09811: Aquatic Therapy  Supervised Modalities  97010: Thermal modalities: hot/cold packs  91478: Electrical stimulation  97750: Physical performance test or measurement (eg, musculoskeletal, functional capacity).       Goals    [name] 1: Patient will demonstrate independence in prescribed HEP with proper form, sets and reps for safe discharge to an independent program.     Sessions: 16      [name] 2: Patient will demonstrate improvement in FOTO score from initial score of 49 to score of 61 to demonstrate functional improvement during course of care     Sessions: 16      [name] 3: Patient will demonstrate improvement of 30 second Chair Rise Test to 14 reps in 30 sec to allow them to stand without pain or compensations.           Sessions: [address]                                   Jena Gauss, DPT

## 2020-02-04 ENCOUNTER — Inpatient Hospital Stay: Payer: Medicare Other | Admitting: Physical Medicine & Rehabilitation

## 2020-02-04 DIAGNOSIS — M545 Low back pain, unspecified: Secondary | ICD-10-CM

## 2020-02-04 NOTE — PT/OT Therapy Note (Signed)
Name:Vander Janae Sauce Age: 74 y.o.   Date of Service: 02/04/2020  Referring Physician: Gus Rankin, PA   Date of Injury: 01/30/2020  Date Care Plan Established/Reviewed: 02/01/2020  Date Treatment Started: 02/01/2020  End of Certification Date: 04/30/2020  Sessions in Plan of Care: 16  Surgery Date: No data was found      Visit Count: 2   Diagnosis:   1. Acute bilateral low back pain, unspecified whether sciatica present             Precautions: No data was found  Allergies: Patient has no known allergies.    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      Functional Strength   Sit to Stand: independent (Chair rise test: 10 in 30 sec)  Squat: full  Step-up: 8 inches  Heel Raises      Unilateral (Left): 16     Unilateral (Right): 16     Integumentary   no wound, lesion or rash noted     Range of Motion      AROM: Lumbar Spine    IE 02/01/20     Lumbar Flexion past floor to CMP joint     Lumbar Extension pain     (blank fields were intentionally left blank)     IE 02/01/20    Left AROM      Left PROM    Lumbar/Hip IE 02/01/20    Right AROM      Right PROM           Lumbar Rotation                   Hip Flexion                   Hip Extension                   Hip Abduction                   Hip Adduction                   Hip IR                   Hip ER           (blank fields were intentionally left blank)  Lumbar side bending IE: L 8 deg; R 10 deg           Strength      IE 02/01/20     Left Strength  Hip  MMT IE 02/01/20    Right   5   Hip Flexion 5         Hip Extension       4+   Hip Abduction 4+  Hip Adduction           Hip IR           Hip ER           Quadriceps           Hamstrings       (blank fields were intentionally left blank)     IE 02/01/20    Left Strength  Knee  MMT IE 02/01/20    Right   5   Knee Flexion 5     5   Knee Extension 5     (blank fields were intentionally left blank)     IE 02/01/20    Left Strength  Ankle/Foot  MMT IE 02/01/20    Right   5   Ankle Dorsiflexion 5         Ankle Plantarflexion           Ankle Inversion           Ankle Eversion       (blank fields were intentionally left blank)       Left Right   Heel Week       Toe walk       Tranverse abdominals Negative Negative   (blank fields were intentionally left blank)     Palpation TTP to R QL.   Lumbar paraspinals hypertonic bilaterally.        L& R: Positive quadrant.  FABER: L and R: positive and 29 cm table to tib tuberosity      CPAs IE: L1-5 painful and hypomobile  UPAs IE R 3-4 painful      BP: (!) 178/94       Pt reports white coat syndrome and noise both known to raise BP. Pt monitors at home and will take again later today.                 Treatment     Therapeutic Exercises - Justified to address any of the following: develop strength, endurance, ROM and/or flexibility.   Introduced Archivist on wall     Introduced foam rolling on wall    Introduced anti-rotation walkouts 3 steps x 5 each direction. Discontinued d/t pain near end.         Neuromuscular Re-Education - Justified to address any of the following: of movement, balance, coordination, kinesthetic sense, posture and/or proprioception for sitting and/or standing activities.   Core concept was explained with individual contraction of the core muscles, setting the core muscles before different activities was discussed and practiced, using a pressure biofeedback stabilizer and fingertip biofeedback to ensure stability of the lumbar spine  with setting the core with different movements including:  TrA contraction with diaphragmatic breathing x 4  Supine march x 10  Heel slides x 10  Heel taps from tabletop  SLR x 10 ea          Manual Therapy - Justified to address any of the following:  Mobilization of joints and soft tissues, manipulation, manual lymphatic drainage, and/or manual traction.    STM to bilateral lumbar and low thoracic paraspinals, QL,        ---      ---   Total Time   Timed Minutes  45 minutes   Total Time  45 minutes        Assessment   Patient preference was to not perform warmup d/t pain increases with any of the cardio machine options. Focus of session  was on core stability and pain relieving techniques. Stability included intro of TrA contraction with various LE movements. Pt strong enough to perform ab exercises however lacked full stability to limit pelvic movement during and required pressure biofeedback cuff for more sensitive feedback compared to fingers and therapist feedback. Pt able to perform anti-rotation but felt that it brought on his pain and discontinued.  Pt preference currently to limit movement to avoid pain. Patient had no unexpected reactions to today's interventions. Patient continues to benefit from physical therapy to increase ROM, decrease pain, increase stability and to reach the goals listed below.  Plan   For visit 3: lumbar extension quadrants in standing, sitting, and passive in supine. Cont and progress lumbar stab, lumbar rotation. Continue MT for pain relief and increased tissue extensibility. Consider TDN, estim, M gun.           Goals    Goal 1: Patient will demonstrate independence in prescribed HEP with proper form, sets and reps for safe discharge to an independent program.     Sessions: 16      Goal 2: Patient will demonstrate improvement in FOTO score from initial score of 49 to score of 61 to demonstrate functional improvement during course of care     Sessions: 16      Goal 3: Patient will  demonstrate improvement of 30 second Chair Rise Test to 14 reps in 30 sec to allow them to stand without pain or compensations.           Sessions: [address]                                   Jena Gauss, DPT

## 2020-02-07 ENCOUNTER — Other Ambulatory Visit (INDEPENDENT_AMBULATORY_CARE_PROVIDER_SITE_OTHER): Payer: Self-pay

## 2020-02-07 DIAGNOSIS — M48062 Spinal stenosis, lumbar region with neurogenic claudication: Secondary | ICD-10-CM

## 2020-02-10 NOTE — Progress Notes (Signed)
CONSULTATION NOTE    Date Time: 02/11/2020@ 3:43 PM  Patient Name: Frederick [name]  Requesting Physician: Dr. Rayann Heman      Reason for Consultation:   Preop clearance    Assessment and Plan:   Preop--EKG - done reviewed, NSR with no acute ST-T changes. Patient is medically optimized for schedule procedure. Pt is low risk for procedure.   -Patient has no risk factors (no history of CAD, CHF, CVA, DM, or CKD).    -METs: Pt is able to perform > or equal to 4 mets.   -Patient to take meds morning of surgery as discussed. Stop NSAIDs and supplements 1 week prior to surgery.  -Social history of smoking and alcohol intake reviewed with pt.  -Labs reviewed   -No h/o anesthesia complications or OSA.    HTN-BP has white coat hypertension so BP elevated in clinic but BP is well controlled at home. Recent stress test normal. ECHO is pending    Hyperlipidemia-pt on statin. On Lipitor 20 mg daily    Vitamin D deficiency    Low back pain and right radiculopathy--. Pt scheduled for surgery. Stop NSAIds and supplements 1 week prior to surgery. Continue other current pain meds.       History:   Frederick [name] is a 74 y.o. male who presents to the clinic  with ast medical history of HTN, hyperlipidemia, vitamin D deficiency. Patient complained of low back pain with radicular symptoms, failed conservative management as outpatient. Pain was progressively worsening over the last several months. Pain is chronic, continuous, described as a dull aching, and radiating to Rt hip area, and not associated with any neurological symptoms. Ambulation is an aggravating factor, and relieved with pain medications occasionally. No other symptoms associated with it. Limited range of motion secondary to recent surgery.Denies fever, chills, shortness of breath, cough, chest pain, palpitations, abdominal pain, and nausea/vomiting. Urine and stools have been normal. Denies headache, dizziness, loss of consciousness, seizures, and no falls. No  recent trauma. No focal deficits. Vision, speech and swallow have been normal.       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    Psoriasis     Tinnitus 1993       Past Surgical History:     Past Surgical History:   Procedure Laterality Date    APPENDECTOMY  1973    COLONOSCOPY  02/2011    COLONOSCOPY N/A 08/24/2016    Procedure: COLONOSCOPY;  Surgeon: Lestine Mount, MD;  Location: [facility];  Service: Gastroenterology;  Laterality: N/A;  COLONOSCOPY    EXCISION, CYST  12/2006    back    KNEE ARTHROSCOPY Left 09/2011    RELEASE, 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    RELEASE, 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    ROTATOR CUFF REPAIR Left 09/2000    and shaving of bone spur       Family History:     Family History   Problem Relation Age of Onset  Alzheimer's disease Mother     Heart attack Father     Stroke Paternal Grandfather        Social History:   Nonsmoker,2 glasses of wine daily with dinner  Allergies:   No Known Allergies    Medications:     Current Outpatient Medications:     amLODIPine (NORVASC) 5 MG tablet, Take 1 tablet (5 mg total) by mouth daily Already spoke with pharmacist this is a new medication trial of 30 day DR Sherilyn Cooter, Disp: 90 tablet, Rfl: 1    aspirin EC 81 MG EC tablet, Take 81 mg by mouth daily, Disp: , Rfl:     atorvastatin (Lipitor) 20 MG tablet, Take 1 tablet (20 mg total) by mouth nightly, Disp: 90 tablet, Rfl: 1    ergocalciferol (ERGOCALCIFEROL) 1.25 MG (50000 UT) capsule, Take 1 capsule (50,000 Units total) by mouth once a week for 12 doses, Disp: 12 capsule, Rfl: 0    eszopiclone (LUNESTA) 2  MG tablet, TAKE 1 TABLET NIGHTLY      IMMEDIATELY BEFORE BEDTIME, Disp: 90 tablet, Rfl: 1    losartan (COZAAR) 100 MG tablet, Take 1 tablet (100 mg total) by mouth daily (Patient taking differently: Take 100 mg by mouth nightly  ), Disp: 90 tablet, Rfl: 3     Review of Systems:     Constitutional: Negative for chills, weight loss, malaise/fatigue and diaphoresis.   HENT: Negative for hearing loss, ear pain, nosebleeds, congestion, sore throat, neck pain, tinnitus and ear discharge.    Eyes: Negative for blurred vision, double vision, photophobia, pain, discharge and redness.   Respiratory: Negative for cough, hemoptysis, sputum production, shortness of breath, wheezing and stridor.    Cardiovascular: Negative for chest pain, palpitations, orthopnea, claudication, leg swelling and PND.   Gastrointestinal: Negative for heartburn, nausea, vomiting, abdominal pain, diarrhea, constipation, blood in stool and melena.   Genitourinary: Negative for dysuria, urgency, frequency, hematuria and flank pain.   Musculoskeletal: Negative for myalgias, back pain, joint pain and falls.   Skin: Negative for itching and rash.   Neurological: Negative for dizziness, tingling, tremors, sensory change, speech change, focal weakness, seizures, loss of consciousness, weakness and headaches.   Endo/Heme/Allergies: Negative for environmental allergies and polydipsia. Does not bruise/bleed easily.   Psychiatric/Behavioral: Negative for depression, suicidal ideas, hallucinations, memory loss and substance abuse. The patient is not nervous/anxious and does not have insomnia.      Physical Exam:     Vitals:    02/11/20 1520   BP: (!) 183/93   Pulse: 86   Resp: 12   Temp: 98.2 F (36.8 C)   SpO2: 100%       IConstitutional: Patient is oriented to person, place, and time. Patient appears well-developed and well-nourished.   Head: Normocephalic and atraumatic.  Eyes- pupils equal and reactive, extraocular eye movements intact, sclera  anicteric  Ears - external ear canals normal, right ear normal, left ear normal  Nose - normal and patent, no erythema,   Mouth - mucous membranes moist, pharynx normal without lesions  Neck: Normal range of motion. Neck supple. No JVD present.   Cardiovascular: Normal rate, regular rhythm, normal heart sounds and intact distal pulses.  Exam reveals no gallop and no friction rub. No murmur heard.  Pulmonary/Chest: Effort normal and breath sounds normal. No stridor. No respiratory distress. Patient has no wheezes. No rales were present.  Exhibits no tenderness.   Abdominal: Soft. Bowel sounds are normal. Patient exhibits no distension and no mass  was palpable. There is no tenderness. There is no rebound and no guarding.   Musculoskeletal: Normal range of motion. Patient exhibits no edema and no tenderness.   Lymphadenopathy:  Patient has no cervical adenopathy.   Neurological: Patient is alert and oriented to person, place, and time and has normal reflexes. No cranial nerve deficit.  Normal muscle tone. Coordination normal.   Skin: Skin is warm. No rash noted. Patient is not diaphoretic. No erythema. No pallor.   Psychiatric: Has normal mood and affect. Behavior is normal. Judgment and thought content normal.    Labs Reviewed:     Results     Procedure Component Value Units Date/Time    Comprehensive metabolic panel [[id number]] Collected: 02/11/20 1528    Specimen: Blood Updated: 02/11/20 1528    CBC and differential [[id number]] Collected: 02/11/20 1528    Specimen: Blood Updated: 02/11/20 1528    Presurgical Surveillance, MSSA+MRSA [[id number]] Collected: 02/11/20 1528    Specimen: Nares Updated: 02/11/20 1528    Presurgical Surveillance, MSSA+MRSA [[id number]] Collected: 02/11/20 1528    Specimen: Throat Updated: 02/11/20 1528            EKG--NSR with no acute ST-T changes      Rads:   Radiological Procedure reviewed.     Time spent during patient care:80 min    Signed by: Christel Mormon

## 2020-02-11 ENCOUNTER — Encounter: Payer: Self-pay | Admitting: Internal Medicine

## 2020-02-11 ENCOUNTER — Ambulatory Visit: Payer: Medicare Other | Attending: Internal Medicine | Admitting: Internal Medicine

## 2020-02-11 VITALS — BP 183/93 | HR 86 | Temp 98.2°F | Resp 12 | Ht 74.0 in | Wt 190.0 lb

## 2020-02-11 DIAGNOSIS — Z01818 Encounter for other preprocedural examination: Secondary | ICD-10-CM | POA: Insufficient documentation

## 2020-02-11 LAB — CBC AND DIFFERENTIAL
Absolute NRBC: 0 10*3/uL (ref 0.00–0.00)
Basophils Absolute Automated: 0.02 10*3/uL (ref 0.00–0.08)
Basophils Automated: 0.3 %
Eosinophils Absolute Automated: 0.05 10*3/uL (ref 0.00–0.44)
Eosinophils Automated: 0.8 %
Hematocrit: 45.4 % (ref 37.6–49.6)
Hgb: 15.1 g/dL (ref 12.5–17.1)
Immature Granulocytes Absolute: 0.01 10*3/uL (ref 0.00–0.07)
Immature Granulocytes: 0.2 %
Lymphocytes Absolute Automated: 1.09 10*3/uL (ref 0.42–3.22)
Lymphocytes Automated: 18.2 %
MCH: 31 pg (ref 25.1–33.5)
MCHC: 33.3 g/dL (ref 31.5–35.8)
MCV: 93.2 fL (ref 78.0–96.0)
MPV: 9.6 fL (ref 8.9–12.5)
Monocytes Absolute Automated: 0.46 10*3/uL (ref 0.21–0.85)
Monocytes: 7.7 %
Neutrophils Absolute: 4.35 10*3/uL (ref 1.10–6.33)
Neutrophils: 72.8 %
Nucleated RBC: 0 /100 WBC (ref 0.0–0.0)
Platelets: 275 10*3/uL (ref 142–346)
RBC: 4.87 10*6/uL (ref 4.20–5.90)
RDW: 13 % (ref 11–15)
WBC: 5.98 10*3/uL (ref 3.10–9.50)

## 2020-02-11 LAB — COMPREHENSIVE METABOLIC PANEL
ALT: 47 U/L (ref 0–55)
AST (SGOT): 36 U/L — ABNORMAL HIGH (ref 5–34)
Albumin/Globulin Ratio: 1.4 (ref 0.9–2.2)
Albumin: 4.2 g/dL (ref 3.5–5.0)
Alkaline Phosphatase: 59 U/L (ref 38–106)
Anion Gap: 12 (ref 5.0–15.0)
BUN: 13.2 mg/dL (ref 9.0–28.0)
Bilirubin, Total: 1.8 mg/dL — ABNORMAL HIGH (ref 0.2–1.2)
CO2: 26 mEq/L (ref 22–29)
Calcium: 9.7 mg/dL (ref 7.9–10.2)
Chloride: 105 mEq/L (ref 100–111)
Creatinine: 0.9 mg/dL (ref 0.7–1.3)
Globulin: 3 g/dL (ref 2.0–3.6)
Glucose: 123 mg/dL — ABNORMAL HIGH (ref 70–100)
Potassium: 4.1 mEq/L (ref 3.5–5.1)
Protein, Total: 7.2 g/dL (ref 6.0–8.3)
Sodium: 143 mEq/L (ref 136–145)

## 2020-02-11 LAB — GFR: EGFR: >60

## 2020-02-12 LAB — PRESURGICAL SURVEILLANCE, MSSA+MRSA

## 2020-02-13 ENCOUNTER — Inpatient Hospital Stay: Payer: Medicare Other | Admitting: Physical Medicine & Rehabilitation

## 2020-02-14 ENCOUNTER — Other Ambulatory Visit: Payer: Self-pay

## 2020-02-15 ENCOUNTER — Telehealth: Payer: Self-pay

## 2020-02-15 ENCOUNTER — Ambulatory Visit
Admission: RE | Admit: 2020-02-15 | Discharge: 2020-02-15 | Disposition: A | Discharge status: Home / Self Care | Payer: Medicare Other | Source: Ambulatory Visit | Attending: Cardiovascular Disease | Admitting: Cardiovascular Disease

## 2020-02-15 ENCOUNTER — Encounter: Payer: Self-pay | Admitting: Neurological Surgery

## 2020-02-15 DIAGNOSIS — R011 Cardiac murmur, unspecified: Secondary | ICD-10-CM | POA: Insufficient documentation

## 2020-02-15 DIAGNOSIS — I2510 Atherosclerotic heart disease of native coronary artery without angina pectoris: Secondary | ICD-10-CM | POA: Insufficient documentation

## 2020-02-15 NOTE — Pre-Procedure Instructions (Signed)
SPECIAL INSTRUCTIONS FOR THE [facility] NAVIGATORS:  CONTACT ISOLATION:     TRANSLATOR REQUESTED:    SPECIAL INSTRUCTIONS:  HOH - USES AIDS.  TRANSPORTATION:        TESTING - DAY OF SURGERY:    PREGNANCY TEST WAIVERS:    ABNORMAL LABS:        SPECIAL CONSIDERATIONS-BLOOD PRODUCTS:    SPECIAL NEEDS FOR PATIENT - BEHAVIORAL /SENSORY:       PLEASE DO NOT REPLY TO THIS EMAIL. IF YOU HAVE ANY QUESTIONS, PLEASE CONTACT THE PREOP INTERVIEW OFFICE AT [phone] OR [phone]) [phone]   MONDAY - FRIDAY.      Date of Procedure: 03/06/20    Arrival Time: 6:00 AM     Procedure Time: 7:30 AM      Someone from the hospital will call you after 4pm the business day prior to your scheduled procedure to verify the arrival and procedure times listed above. The purpose of this call is to advise you of any scheduling changes that may have occurred since the time of your telephone interview with the pre surgical services nurse.    NOTE FROM THE ANESTHESIA DEPARTMENT:  Please note that your procedure may be cancelled or rescheduled if you do not complete the required testing, preop clearances and/or follow the medication instructions that are required by your surgeon and /or the anesthesia department.    Requirements marked with an "X" below (i.e.: testing, etc.) Must be completed prior to your procedure :      ( X ) Shower with Chlorhexidine (Hibiclens) wash prior to your procedure.  An instruction sheet detailing how you should use this product is attached to this email.          ( X ) Eating: You should not have anything to eat after  11:30PM ON 03/05/20.  (DO NOT HAVE ANYTHING TO EAT 8 HOURS PRIOR TO THE PROCEDURE TIME).    ( X ) Drinking: Drink clear liquids until  3:30AM ON 03/06/20.  (DO NOT HAVE ANYTHING TO DRINK 4 HOURS PRIOR TO THE PROCEDURE TIME).    CLEAR LIQUIDS INCLUDE: WATER, GATORADE, JUICES WITHOUT PULP (APPLE, CRANBERRY, GRAPE), CARBONATED BEVERAGES, BLACK COFFEE OR  BLACK TEA (WITHOUT MILK, CREAM OR NON-DAIRY CREAMER). SUGAR OR  SWEETENER MAY BE ADDED.       Medication Instructions:    AMLODIPINE (NORVASC)  TAKE DOSE EVENING PRIOR TO THE PROCEDURE.    ASPIRIN 81 MG  STOP DOSE AS OF 02/28/20.    ATORVASTATIN (LIPITOR)  TAKE DOSE EVENING PRIOR TO THE PROCEDURE.    ESZOPICLONE (LUNESTA)  TAKE DOSE EVENING PRIOR TO THE PROCEDURE.    LOSARTAN (COZAAR)  TAKE DOSE EVENING PRIOR TO THE PROCEDURE.     VITAMIN D  NO DOSE IS DUE ON THE PROCEDURE DAY.     YOU SHOULD NOT TAKE IBUPROFEN, MOTRIN, ADVIL, ALEVE, NSAIDS AS OF 02/28/20.     YOU MAY TAKE TYLENOL AS NEEDED.    Hospital Address and Arrival Instructions:   [address] [place], [place] [address]  [facility] is where you will enter.  Please call [phone] morning of surgery if you need assistance upon arrival.    Upon arrival in the hospital main lobby via [employer], you will proceed to:  - Patient Registration, which is all the way down the hall on the left.  - Once registration is completed, you will be escorted by the registration staff to the Surgical Services Waiting Area, where you wait until  a PreOp clinical team member escorts you to a room and initiates the PreOp process.  - You will need to have a valid photo I.D., insurance card and co-pay form of payment if required by insurance.    Pre Surgical General Instructions:  1. Bathe or shower the morning of the procedure with an anti-bacterial soap before arriving (unless instructed to use Chlorhexidine (CHG) or Hibiclens).   2. Do not apply lotion, perfume, cologne, or hair-care products such as hair spray or gels.  3. Do not shave your surgical site at home.  4. Do not wear makeup, jewelry including body piercing, watches, earrings, or rings.   5. You may brush your teeth and gargle on the morning of surgery but do not swallow any water.  6. Wear casual, loose fitting and comfortable clothing. A gown will be provided.  7. Plan to leave unnecessary valuables, credit cards (except for co-pay payment use) and  jewelry at home or with a companion on day of surgery for safe keeping. The hospital is not responsible for lost/stolen items.  8. If you wear contacts please leave them at home. If you wear glasses, please bring a case.  9. Dentures - we will provide a container  10. Hearing aids, you may bring dos but you will be asked to remove them before surgery.  11. Please arrange for someone to drive you home. For your safety you will not be allowed to drive home after sedation or anesthesia. A responsible adult must be present to accompany you home when you are ready to leave. We strongly recommend that all patients have an adult at home with them for the first 24 hours after surgery.  12. Notify your doctor if you develop any sign of illness before the date of your surgery. Report symptoms such as: high fever, sore throat, or other infection, breathing difficulties or chest pain.  13. Discontinue herbal supplements and herbal/green tea one week prior to surgery.    Below is a link/web address to the Preparing for Your Procedure video that walks you through the surgical experience.   [email or web address]    Possible Anesthesia Side Effects:   Nausea and vomiting. This common side effect usually occurs immediately after the procedure, but some people may continue to feel sick for a day or two. Anti-nausea medicines can help.   Dry mouth. You may feel parched when you wake up. As long as you're not too nauseated, sipping water can help take care of your dry mouth.   Sore throat or hoarseness. The tube put in your throat to help you breathe during surgery can leave you with a sore throat after it's removed.   Chills and shivering. It's common for your body temperature to drop during general anesthesia. Your doctors and nurses will make sure your temperature doesn't fall too much during surgery, but you may wake up shivering and feeling cold. Your chills may last for a few minutes to  hours.   Confusion and fuzzy thinking. When first waking from anesthesia, you may feel confused, drowsy, and foggy. This usually lasts for just a few hours, but for some people -- especially older adults -- confusion can last for days or weeks.   Muscle aches. The drugs used to relax your muscles during surgery can cause soreness afterward.   Itching. If narcotic (opioid) medications are used during or after your operation, you may be itchy. This is a common side effect of this class of drugs.   Bladder  problems. You may have difficulty passing urine for a short time after general anesthesia.   Dizziness. You may feel dizzy when you first stand up. Drinking plenty of fluids should help you feel better.

## 2020-02-18 ENCOUNTER — Other Ambulatory Visit: Payer: Self-pay

## 2020-02-18 DIAGNOSIS — G479 Sleep disorder, unspecified: Secondary | ICD-10-CM

## 2020-02-18 MED ORDER — ESZOPICLONE 2 MG PO TABS
ORAL_TABLET | ORAL | 1 refills | Status: DC
Start: 2020-02-18 — End: 2020-02-21

## 2020-02-19 ENCOUNTER — Ambulatory Visit: Payer: Medicare Other | Admitting: Internal Medicine

## 2020-02-19 LAB — ECHOCARDIOGRAM ADULT COMPLETE W CLR/ DOPP WAVEFORM
AV Area (Cont Eq VTI): 2.86
AV Area (Cont Eq VTI): 2.867
AV Mean Gradient: 5
AV Peak Velocity: 159
Ao Root Diameter (2D): 3.7
IVS Diastolic Thickness (2D): 1.17
IVS Diastolic Thickness (2D): 1.38
LA Dimension (2D): 4.6
LA Dimension (2D): 4.6
LA Volume Index (BP A-L): 0.028
LVID diastole (2D): 4.72
LVID diastole (2D): 4.93
LVID systole (2D): 3.04
MV Area (PHT): 3.122
MV E/A: 0.9
MV E/A: 0.939
MV E/e' (Average): 9.754
Mitral Valve Findings: NORMAL
Prox Ascending Aorta Diameter: 3.7
Pulmonary Valve Findings: NORMAL
RV Basal Diastolic Dimension: 3.42
RV Systolic Pressure: 24
RV Systolic Pressure: 25.09
TAPSE: 2.77
Tricuspid Valve Findings: NORMAL
Visually Estimated Ejection Fraction: 60

## 2020-02-21 ENCOUNTER — Telehealth: Payer: Self-pay | Admitting: Internal Medicine

## 2020-02-21 ENCOUNTER — Inpatient Hospital Stay: Payer: Medicare Other | Admitting: Physical Medicine & Rehabilitation

## 2020-02-21 DIAGNOSIS — M545 Low back pain, unspecified: Secondary | ICD-10-CM

## 2020-02-21 DIAGNOSIS — G479 Sleep disorder, unspecified: Secondary | ICD-10-CM

## 2020-02-21 MED ORDER — ESZOPICLONE 2 MG PO TABS
ORAL_TABLET | ORAL | 1 refills | Status: DC
Start: 2020-02-21 — End: 2020-11-17

## 2020-02-21 NOTE — Telephone Encounter (Signed)
Insurance needed new PA, refill was already sent to [facility] on 7/26 - completed PA on [email or web address] - awaiting outcome.

## 2020-02-21 NOTE — Progress Notes (Signed)
Submitted on Thu, 02/21/2020 - 12:59  Submitted by: Anonymous  Submitted values are:  First Name  Frederick    Last Name  Dravin [name]  [email or web address]    Phone  [phone]    Address  [address]  [place], [place]. [address]    At which [facility] are you having your surgery?  [facility]    1. I should shower with chlorhexidine gluconate* (CHG) before my surgery.  True    2. It is okay to continue taking aspirin before my surgery.  False    3. I should cough, deep breathe and do frequent ankle pumps after my surgery. I will be asked to get out of bed the day of my surgery. Even if I go home the day of surgery, walking and expanding my lungs (occasional deep breathe and cough) will prevent complications.  True    4. I will be asked to rate my pain using a 0-10 scale. 10 would indicate the highest level of pain.  True    Do you feel that this online education gave you the necessary information to prepare you for your upcoming spine surgery?  Yes

## 2020-02-21 NOTE — PT/OT Therapy Note (Addendum)
Name:Frederick [name] Age: 74 y.o.   Date of Service: 02/21/2020  Referring Physician: Gus Rankin, PA   Date of Injury: 01/30/2020  Date Care Plan Established/Reviewed: 02/01/2020  Date Treatment Started: 02/01/2020  End of Certification Date: 04/30/2020  Sessions in Plan of Care: 16  Surgery Date: No data was found      Visit Count: 3   Diagnosis:   1. Acute bilateral low back pain, unspecified whether sciatica present        Subjective     Social Support/Occupation      Occupation: Retired    Patient has had no change in pain since IE; pain continues to be intense across belt line bilaterally and down along sacrum but does not travel further into LEs.       Precautions: No data was found  Allergies: Patient has no known allergies.    Past Medical History:   Diagnosis Date    Diverticulitis     Elevated coronary artery calcium score 01/2020    H/O ABNORMAL CORONARY CALCIUM SCORE. 01/22/20 CARD NOTE FROM DR. BONTEMPO IN EPIC. 02/15/20 ECHO RESULTS PENDING IN EPIC.     Hearing loss     wears hearing aids, about 20% bilaterally     Heart murmur 01/22/2020    basal systolic murmur - ASYMPTOMATIC.    Hyperacusis of both ears     participating in audiotherapy     Hyperlipidemia     MANAGED WITH MEDS.    Hypertension      MANAGED WITH MEDS. H/O WHITE COAT SYNDROME.     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    Low back pain     HX    Tinnitus 1993     Objective      Observation  Patient demonstrates in stance: straight posture, level shoulders, decreased thoracic kyphosis, decreased lumbar lordosis, straight knees.      Functional Strength   Sit to Stand: independent (Chair rise test: 10 in 30 sec)  Squat: full  Step-up: 8 inches  Heel Raises      Unilateral (Left): 16     Unilateral (Right): 16     Integumentary   no wound, lesion or rash noted     Range of Motion      AROM: Lumbar Spine    IE 02/01/20     Lumbar Flexion past floor to CMP joint     Lumbar Extension pain     (blank fields were intentionally left blank)     IE  02/01/20    Left AROM      Left PROM    Lumbar/Hip IE 02/01/20    Right AROM      Right PROM           Lumbar Rotation                   Hip Flexion                   Hip Extension                   Hip Abduction                   Hip Adduction                   Hip IR                   Hip ER           (  blank fields were intentionally left blank)  Lumbar side bending IE: L 8 deg; R 10 deg           Strength      IE 02/01/20    Left Strength  Hip  MMT IE 02/01/20    Right   5   Hip Flexion 5         Hip Extension       4+   Hip Abduction 4+         Hip Adduction           Hip IR           Hip ER           Quadriceps           Hamstrings       (blank fields were intentionally left blank)     IE 02/01/20    Left Strength  Knee  MMT IE 02/01/20    Right   5   Knee Flexion 5     5   Knee Extension 5     (blank fields were intentionally left blank)     IE 02/01/20    Left Strength  Ankle/Foot  MMT IE 02/01/20    Right   5   Ankle Dorsiflexion 5         Ankle Plantarflexion           Ankle Inversion           Ankle Eversion       (blank fields were intentionally left blank)       Left Right   Heel Week       Toe walk       Tranverse abdominals Negative Negative   (blank fields were intentionally left blank)     Palpation TTP to R QL.   Lumbar paraspinals hypertonic bilaterally.        L& R: Positive quadrant.  FABER: L and R: positive and 29 cm table to tib tuberosity      CPAs IE: L1-5 painful and hypomobile  UPAs IE R 3-4 painful      BP: (!) 178/94       Pt reports white coat syndrome and noise both known to raise BP. Pt monitors at home and will take again later today.                       Treatment     Therapeutic Exercises - Justified to address any of the following: develop strength, endurance, ROM and/or flexibility.   Not today 02/21/2020:   Tennis ball massage on wall   Foam rolling on wall  Anti-rotation walkouts 3 steps x 5 each direction. Discontinued.     Neuromuscular Re-Education - Justified to address any of the  following: of movement, balance, coordination, kinesthetic sense, posture and/or proprioception for sitting and/or standing activities.   Not today 02/21/2020:   Core concepts continued with TrA contraction with the following:   TrA contraction with diaphragmatic breathing x 4  Supine march x 10  Heel slides x 10  Heel taps from tabletop  SLR x 10 ea    Manual Therapy - Justified to address any of the following:  Mobilization of joints and soft tissues, manipulation, manual lymphatic drainage, and/or manual traction.    STM to bilateral lumbar and low thoracic paraspinals, QL, posterior thoracic region and UT region.     Massage gun to lumbar  spine bilaterally  and to bilateral glutes. Pt prone.     Home Exercises   Access Code: [email or web address]  URL: [email or web address]  Date: 02/21/2020  Prepared by: Rennis Harding    Exercises  Supine Diaphragmatic Breathing - 1 x daily - 7 x weekly - 1 sets - 10 reps  Supine Transversus Abdominis Bracing - Hands on Stomach - 1 x daily - 7 x weekly - 1 sets - 10 reps  Supine Posterior Pelvic Tilt - 3 x daily - 5 x weekly - 10 reps - 3 sets - 5 hold  Supine Transversus Abdominis Bracing with Heel Slide - 1 x daily - 7 x weekly - 3 sets - 10 reps  Supine March - 1 x daily - 7 x weekly - 3 sets - 10 reps  Supine Transversus Abdominis Bracing with Heel Slide - 1 x daily - 7 x weekly - 3 sets - 10 reps  Supine 90/90 Alternating Heel Touches with Posterior Pelvic Tilt - 1 x daily - 7 x weekly - 3 sets - 10 reps  Small Range Straight Leg Raise - 1 x daily - 7 x weekly - 3 sets - 10 reps  Supine Double Knee to Chest - 2 x daily - 7 x weekly - 10 reps - 1 sets - 10 sec hold  Supine Single Knee to Chest Stretch - 2 x daily - 7 x weekly - 10 reps - 1 sets - 10 sec hold  Supine Lower Trunk Rotation - 3 x daily - 5 x weekly - 10 reps - 3 sets - 5 hold  Supine 90/90 Lower Trunk Rotation - 3 x daily - 5 x weekly - 10 reps - 3 sets - 5 hold  Seated Hamstring Stretch - 1 x daily - 7 x  weekly - 3 sets - 10 reps    Patient Education  TENS Therapy  Ice  Heat    Emailed to : [email or web address]    Modalities   Skin check completed    Electrical Stimulation: IFC 6 min with edu provided, set up, and clean up. Location lumbar spine. Position Prone  Therapy Rationale: Decrease Pain       ---      ---   Total Time   Timed Minutes  43 minutes   Total Time  43 minutes        Assessment   Patient presents with continued intense levels of pain at lumbar spine. Due to tissue irritability, pain intensity, and patient preference, focus of session was decrease of pain with MT and modalities. Patient prefers to not perform TDN at any point. HEP provided with exercises performed at previous sessions and educational information on modalities for pain relief. Patient had no unexpected reactions to today's interventions. Patient continues to benefit from physical therapy to decrease pain and increase function to reach the goals listed below.  Plan   For visit 4: Consider FOTO. Continue MT and modalities for pain relief. Assess lumbar extension quadrants in standing, sitting, and passive in supine. If possible, cont and progress lumbar stab, lumbar rotation.      Goals    Goal 1: Patient will demonstrate independence in prescribed HEP with proper form, sets and reps for safe discharge to an independent program.     Sessions: 16      Goal 2: Patient will demonstrate improvement in FOTO score from initial score of 49 to score of 61 to demonstrate functional improvement during course of care  Sessions: 16      Goal 3: Patient will demonstrate improvement of 30 second Chair Rise Test to 14 reps in 30 sec to allow them to stand without pain or compensations.           Sessions: [address]                                   Jena Gauss, DPT

## 2020-02-21 NOTE — Telephone Encounter (Signed)
I have sent the script can you find out from patient if I need to send a 30 day supply to the local pharmacy. And apologize for the delay, I never received [facility]ill [facility]    Dr Rexene Edison

## 2020-02-21 NOTE — Telephone Encounter (Signed)
Notified pt of [facility] and asked if he wanted a 30 day supply sent to local pharmacy for the meantime. Gave him our apologies as mail order refill requests don't notify us. Told him to call back if he wanted that 30 day supply.

## 2020-02-21 NOTE — Telephone Encounter (Signed)
Patient would like a refill of eszopiclone (LUNESTA) 2 MG tablet - insurance is requiring a pre-authorization, [facility] says [facility] in [facility] (they have not heard from [place]). Please advise. Patient is down to his last pill.

## 2020-02-22 NOTE — Telephone Encounter (Signed)
Received outcome of PA-  Approved 01/22/20 - 02/20/21, will scan into chart

## 2020-02-24 ENCOUNTER — Encounter (INDEPENDENT_AMBULATORY_CARE_PROVIDER_SITE_OTHER): Payer: Self-pay | Admitting: Cardiovascular Disease

## 2020-02-24 NOTE — Progress Notes (Signed)
Spoke with patient over the telephone    Echocardiogram shows normal heart function and no significant valve abnormality.    Nuclear test shows inferior defect consistent with diaphragmatic attenuation with normal wall motion.  Also inferior wall on echo moves normally.    Discussed with Frederick [name].  Needs to concentrate on risk factor modification with weight control, exercise, keeping LDL level as low as possible.    He can undergo anesthesia and back surgery with an acceptable cardiac risk.

## 2020-02-28 ENCOUNTER — Other Ambulatory Visit: Payer: Self-pay | Admitting: Internal Medicine

## 2020-02-28 DIAGNOSIS — Z136 Encounter for screening for cardiovascular disorders: Secondary | ICD-10-CM

## 2020-03-02 ENCOUNTER — Encounter (INDEPENDENT_AMBULATORY_CARE_PROVIDER_SITE_OTHER): Payer: Self-pay

## 2020-03-04 NOTE — Anesthesia Preprocedure Evaluation (Addendum)
Anesthesia Evaluation    AIRWAY    Mallampati: II    TM distance: >3 FB  Neck ROM: full  Mouth Opening:full   CARDIOVASCULAR    regular and normal       DENTAL         PULMONARY    clear to auscultation     OTHER FINDINGS                  8M  Advanced age, HTN, CAD, Spinal stenosis  EKG: NSR  EF 60%  Clearance on chart - Dr Doyne Keel  Cardiology note on chart          Relevant Problems   CARDIO   (+) Coronary artery disease involving native coronary artery of native heart without angina pectoris   (+) Essential hypertension               Anesthesia Plan    ASA 2     general                     intravenous induction   Detailed anesthesia plan: general endotracheal            informed consent obtained    Plan discussed with CRNA.      pertinent labs reviewed         ECHO:    Summary    * Left ventricular systolic function is normal with an ejection fraction by  Biplane Method of Discs of  62 %.    * Left ventricular ejection fraction is estimated to be  60 % by visual  inspection.    * Left ventricular wall thickness is mildly increased.    * There is mild aortic regurgitation.    * No pulmonary hypertension with estimated right ventricular systolic  pressure of  25 mmHg.    * No prior study is available for comparison.      Signed by: Suzanne Boron 03/04/20 4:07 PM

## 2020-03-05 ENCOUNTER — Emergency Department (HOSPITAL_COMMUNITY): Payer: Medicare HMO

## 2020-03-05 ENCOUNTER — Encounter (HOSPITAL_COMMUNITY): Payer: Self-pay

## 2020-03-05 ENCOUNTER — Emergency Department (HOSPITAL_COMMUNITY)
Admission: EM | Admit: 2020-03-05 | Discharge: 2020-03-05 | Disposition: A | Discharge status: Home / Self Care | Payer: Medicare HMO | Attending: Emergency Medicine | Admitting: Emergency Medicine

## 2020-03-05 ENCOUNTER — Other Ambulatory Visit: Payer: Self-pay

## 2020-03-05 DIAGNOSIS — N50812 Left testicular pain: Secondary | ICD-10-CM

## 2020-03-05 DIAGNOSIS — Z79890 Hormone replacement therapy (postmenopausal): Secondary | ICD-10-CM | POA: Insufficient documentation

## 2020-03-05 DIAGNOSIS — N5089 Other specified disorders of the male genital organs: Secondary | ICD-10-CM

## 2020-03-05 DIAGNOSIS — I10 Essential (primary) hypertension: Secondary | ICD-10-CM | POA: Insufficient documentation

## 2020-03-05 DIAGNOSIS — Z79899 Other long term (current) drug therapy: Secondary | ICD-10-CM | POA: Insufficient documentation

## 2020-03-05 DIAGNOSIS — N492 Inflammatory disorders of scrotum: Secondary | ICD-10-CM | POA: Diagnosis present

## 2020-03-05 DIAGNOSIS — N452 Orchitis: Secondary | ICD-10-CM

## 2020-03-05 DIAGNOSIS — E039 Hypothyroidism, unspecified: Secondary | ICD-10-CM | POA: Diagnosis not present

## 2020-03-05 DIAGNOSIS — N433 Hydrocele, unspecified: Secondary | ICD-10-CM

## 2020-03-05 DIAGNOSIS — N50819 Testicular pain, unspecified: Secondary | ICD-10-CM

## 2020-03-05 DIAGNOSIS — R110 Nausea: Secondary | ICD-10-CM | POA: Insufficient documentation

## 2020-03-05 LAB — URINALYSIS, ROUTINE W REFLEX MICROSCOPIC
Bilirubin Urine: NEGATIVE
Glucose, UA: NEGATIVE mg/dL
Hgb urine dipstick: NEGATIVE
Ketones, ur: NEGATIVE mg/dL
Leukocytes,Ua: NEGATIVE
Nitrite: NEGATIVE
Protein, ur: NEGATIVE mg/dL
Specific Gravity, Urine: 1.012 (ref 1.005–1.030)
pH: 6 (ref 5.0–8.0)

## 2020-03-05 MED ORDER — CEPHALEXIN 500 MG PO CAPS: 500.0000 mg | ORAL_CAPSULE | Freq: Once | ORAL | Status: DC

## 2020-03-05 NOTE — H&P (Signed)
[facility] Neurosurgery  History and Physical     Primary Care MD: Lynelle Smoke, MD     MRN: [id number]      HPI   Chief Complaint:  Chronic low back pain    HPI    Frederick [name] is a 74 y.o. male with history of hypertension and hyperlipidemiawho presents for follow up onchronic axial low back pain for past 50 years, worsening for past 2 years. He describes constant pain across the beltline, worse on the right side, radiating into the hip and groin with associated tingling. The pain worsens with activity, and he has been unable to play golf for the past year. He denies pain radiating into the legs, weakness, numbness or loss of bowel/bladder control. He has been managing with home exercise and stretching, occasionally Tylenol. At last visit on 04/03/19 he was referred for MRI of the lumbar spine and pain management with Dr. Reva Bores. He has had 3 epidural injections and 2 radiofrequency ablations with no relief. He has not had physical therapy.       Impression   [name] came for follow-up with his wife after several cycles of interventional pain management and injections by Dr. Reva Bores.      He continues to complain about pain that is becoming refractory to the injections.  The pain is specifically bilaterally in the hip area and goes across the back and the axial nature.  It occasionally radiates to the hip and to the inguinal region.  The pain never travels below the buttocks.  Right is worse than left.  Both sides are painful.  He is quite debilitated from this.  He is unable to work out and do cardio.  The pain is worse when he stands moves or walks etc.    I had a very lengthy and frank discussion with the patient in regards to their complaints.  I reviewed the MRI with them in great detail.  I answered all of their questions and concerns.  I used plastic spine models to delineate relevant anatomy and pathophysiology of their pain.  The patient expressed a firm  understanding of our conversation and understood the reasoning and rationale of our plan.  All questions were addressed to their satisfaction.  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  The previous levels that are likely cause of the symptoms are L2-3 and L3-4 with significant facet arthropathy and central by foraminal stenosis at these levels.  The patient is interested in surgical option.  Given the patient's pain and MRI, I recommend a decompression only specifically L2-3 and L3-4.  Decompression of the nerve roots will help with the patient's hip and inguinal pain.  Which time he may develop more mechanical pain at which time may require a fusion in the future.  But for now, I recommend a two-level decompressive lumbar laminectomy.  Hopefully the surgery will be enough to give him a positive quality of life for quite some time.  The patient is very interested in this.    I discussed with the patient the risks, benefits, alternatives of this surgical procedure.  The risks include but are not limited to devastating neurologic injury, vascular injury, infection, CSF leak, reoperation for any reason, anesthetic complications, pain requiring future surgery.  All questions were addressed and the patient agrees to proceed.    I have given the patient a physical therapy prescription  for starting therapy pre and post surgery.        Plan    I again discussed with the patient the nature, indications, risks and alternatives of elective surgery with bilateral L2-3 and L3-4 laminectomies. All questions were answered. At this time our decision is to proceed with surgery      Again, many thanks for allowing me to participate in the care of this patient.  I will keep you apprised of their progress.  Please call me with any questions.    Sincerely,    Delena Bali MD    Neurosurgery  [phone]  (cell)  [phone]  (PA)   Alessandra Bevels, PA   ([email or web address])    Follow-up   No follow-ups on file.     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    Psoriasis     Tinnitus 1993           Surgical History     Past Surgical History         Past Surgical History:   Procedure Laterality Date    APPENDECTOMY  1973    COLONOSCOPY  02/2011    COLONOSCOPY N/A 08/24/2016    Procedure: COLONOSCOPY;  Surgeon: Lestine Mount, MD;  Location: [facility];  Service: Gastroenterology;  Laterality: N/A;  COLONOSCOPY    EXCISION, CYST  12/2006    back    KNEE ARTHROSCOPY Left 09/2011    RELEASE, 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    RELEASE, 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    ROTATOR CUFF REPAIR Left 09/2000    and shaving of bone spur           Family History     Family History         Family History   Problem Relation Age of Onset    Alzheimer's disease Mother     Heart attack Father     Stroke Paternal Grandfather            Social History     Social History           Tobacco Use    Smoking status: Never Smoker    Smokeless tobacco: Never Used   Substance Use Topics    Alcohol use: Yes     Comment: 1-2 glasses of wine daily         Current Medications       Current Outpatient Medications:     amLODIPine (NORVASC) 5 MG tablet, Take 1 tablet (5 mg total) by mouth daily Already spoke with pharmacist this is a new medication trial of 30 day DR Sherilyn Cooter, Disp: 90 tablet, Rfl: 1    aspirin EC 81 MG EC tablet, Take 81 mg by mouth daily, Disp: , Rfl:      atorvastatin (LIPITOR) 10 MG tablet, Take 1 tablet (10 mg total) by mouth daily (Patient taking differently: Take 20 mg  by mouth daily  ), Disp: 90 tablet, Rfl: 3    ergocalciferol (ERGOCALCIFEROL) 1.25 MG (50000 UT) capsule, Take 1 capsule (50,000 Units total) by mouth once a week for 12 doses, Disp: 12 capsule, Rfl: 0    eszopiclone (LUNESTA) 2 MG tablet, TAKE 1 TABLET NIGHTLY      IMMEDIATELY BEFORE BEDTIME, Disp: 90 tablet, Rfl: 1    losartan (COZAAR) 100 MG tablet, Take 1 tablet (100 mg total) by mouth daily (Patient taking differently: Take 100 mg by mouth nightly  ), Disp: 90 tablet, Rfl: 3     Allergies   No Known Allergies     Review of Systems   Review of Systems   Constitutional: Negative.    HENT: Negative.    Eyes: Negative.    Respiratory: Negative.    Cardiovascular: Negative.    Gastrointestinal: Negative.    Genitourinary: Negative.    Musculoskeletal: Positive for back pain and myalgias.   Neurological: Positive for tingling. Negative for sensory change and focal weakness.   Endo/Heme/Allergies: Negative.    Psychiatric/Behavioral: Negative.      Physical Examination   VITAL SIGNS:    There were no vitals filed for this visit.      General:  Well developed, well nourished, no apparent distress  Neck:  Supple, no JVD, no apparent lymphadenopathy  HEENT:  Head normocephalic, atraumatic, no obvious lesions in ear, nose or throat  Chest:  Equal chest rise.  No wheezes, rales or rhonchi.  Skin:  No obvious lesions or scars  Extremities:  Without clubbing or cyanosis    Neurologic Exam    Awake, alert, oriented x3, Follows commands  GCS: 15   Speech is clear  Attention span and concentration: intact  Recent and remote memory: intact    Gait Intact  Able to walk on toes and heels    Point tenderness: None  FROM lumbar spine    Motor Exam:RL  L2-3Iliopsoas (Hip  Flexion)55  L3-4Quadriceps (Knee Extension)55  L5-S1Hamstring (Knee Flexion)55  L4-5Tibialis Anterior (Foot Dorsiflexion)55  S1Gastrocsoleus(Plantar Flexion)55  L5EHL (Toe Dorsiflexion)55      Sensation intact bilaterally    Reflexes:  Patellar (L3-4): right 2+, left 2+  Achilles (L5-S1): right 2+, left 2+  Clonus: right negative, left negative   Babinski: right negative, left negative     Straight leg raise: right negative, left negative     Radiology Interpretation   04/04/19 MRI L-spine w/o contrast:    1. 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 Active discogenic bone marrow edema in the endplates surrounding T10-T11, L2-L3 and L3-4 disc spaces. In addition, diffuse bone marrow edema in the superior half of L4 vertebral body could represent superimposed stress reaction or early stress fracture.  2. Mild multifactorial central spinal canal narrowing at L2-3 and L3-4.   3. At L4-5: Mild right posterolateral disc-osteophyte complex slightly abuts the right exiting L4 nerve roots within the lateral to the foramen.  4. At L5-S1: Mild broad-based posterolateral disc-osteophyte complex is slightly abuts the bilateral exiting L5 nerve roots, left worse than right.    The images above were personally reviewed and discussed in detail with the patient.         Jerilynn Mages, MD    All relevant and clinical information was transcribed by me, Gus Rankin, acting as a scribe for Dr Rayann Heman.

## 2020-03-05 NOTE — ED Triage Notes (Signed)
Arrived POV from home. Patient reports increased swelling and intermittent pain of left testicle. Patient reports hydrocele surgery of left testicle 5 weeks ago and reports the swelling and pain have increased since surgery. Patient states pain starts in left testicle/groin and radiates into left lower abdomen.

## 2020-03-05 NOTE — Discharge Instructions (Signed)
Please call tomorrow morning to ask for an urgent follow up appointment with Dr Alvester Morin or an available urologist this week.  Tell them you were seen in the ER today and Dr McDiarmod referred you into the office.  Start taking keflex tomorrow morning.  Stop taking bactrim.

## 2020-03-05 NOTE — ED Notes (Signed)
Patient provided with urinal and made aware urine sample is needed. 

## 2020-03-05 NOTE — ED Provider Notes (Signed)
[name] Provider Note   CSN: [id number] Arrival date & time: 03/05/20  1422     History Chief Complaint  Patient presents with  . Testicle Pain    Austin Carter is a 74 y.o. male w/ hx of reported hydrocele surgery on July 9th 2021 with Dr Alvester Morin at [facility] urology presenting to ED with worsening scrotal swelling since his surgery.  He reports he does not feel his scrotal swelling is gone any better since he had the surgery done.  If anything he feels like his swelling has worsened.  He describes his had some nausea but no vomiting.  He is having regular bowel movements.  He denies any dysuria or hematuria.  He denies any fevers or chills.  He tried to call Dr. Shannan Harper office but was told the next time he could be seen was 6 weeks from now.   HPI     Past Medical History:  Diagnosis Date  . Blind   . Hypertension   . Hypothyroidism     There are no problems to display for this patient.   Past Surgical History:  Procedure Laterality Date  . HYDROCELE EXCISION    . NASAL SINUS SURGERY  03/14/14  . neuromal foot     right  . torn cartilage right knee         Family History  Problem Relation Age of Onset  . Colon cancer Neg Hx   . Pancreatic cancer Neg Hx   . Stomach cancer Neg Hx   . Rectal cancer Neg Hx     Social History   Tobacco Use  . Smoking status: Never Smoker  . Smokeless tobacco: Never Used  Substance Use Topics  . Alcohol use: Yes    Alcohol/week: 2.0 standard drinks    Types: 2 Glasses of wine per week  . Drug use: No    Home Medications Prior to Admission medications   Medication Sig Start Date End Date Taking? Authorizing Provider  amLODipine (NORVASC) 10 MG tablet Take 10 mg by mouth daily. 01/13/20  Yes [provider]  atorvastatin (LIPITOR) 20 MG tablet Take 20 mg by mouth daily.  01/13/20  Yes [provider]  cetirizine (ZYRTEC) 10 MG tablet Take 10 mg by mouth daily as needed  for allergies.   Yes [provider]  diphenhydrAMINE (BENADRYL) 25 MG tablet Take 25 mg by mouth at bedtime as needed for allergies or sleep.   Yes [provider]  famotidine (PEPCID) 20 MG tablet Take 20 mg by mouth 2 (two) times daily.  02/27/20  Yes [provider]  HYDROcodone-acetaminophen (NORCO/VICODIN) 5-325 MG per tablet Take 2 tablets by mouth every 4 (four) hours as needed. 02/06/15  Yes Eber Hong, MD  levothyroxine (SYNTHROID, LEVOTHROID) 75 MCG tablet Take 75 mcg by mouth daily before breakfast.   Yes [provider]  predniSONE (DELTASONE) 1 MG tablet Take 2 mg by mouth daily as needed (sinus).  02/15/20  Yes [provider]  sulfamethoxazole-trimethoprim (BACTRIM DS) 800-160 MG tablet Take 1 tablet by mouth 2 (two) times daily. 03/04/20  Yes [provider]  naproxen (NAPROSYN) 500 MG tablet Take 1 tablet (500 mg total) by mouth 2 (two) times daily with a meal. Patient not taking: Reported on 03/05/2020 02/06/15   Eber Hong, MD    Allergies    Patient has no known allergies.  Review of Systems   Review of Systems  Constitutional: Negative for chills and  fever.  HENT: Negative for ear pain and sore throat.   Eyes: Negative for pain and visual disturbance.  Respiratory: Negative for cough and shortness of breath.   Cardiovascular: Negative for chest pain and palpitations.  Gastrointestinal: Positive for nausea. Negative for constipation and vomiting.  Genitourinary: Positive for scrotal swelling and testicular pain. Negative for discharge, dysuria, hematuria and penile pain.  Musculoskeletal: Negative for arthralgias and back pain.  Skin: Negative for color change and rash.  Neurological: Negative for syncope and headaches.  Psychiatric/Behavioral: Negative for agitation and confusion.  All other systems reviewed and are negative.   Physical Exam Updated Vital Signs BP 133/67 (BP Location: Left Arm)   Pulse 77    Temp 98 F (36.7 C) (Oral)   Resp 17   SpO2 100%   Physical Exam Vitals and nursing note reviewed.  Constitutional:      Appearance: He is well-developed.  HENT:     Head: Normocephalic and atraumatic.  Eyes:     Conjunctiva/sclera: Conjunctivae normal.  Cardiovascular:     Rate and Rhythm: Normal rate and regular rhythm.     Pulses: Normal pulses.  Pulmonary:     Effort: Pulmonary effort is normal. No respiratory distress.  Abdominal:     General: There is no distension.     Palpations: Abdomen is soft.     Tenderness: There is no abdominal tenderness. There is no guarding.  Genitourinary:    Comments: GU exam with edematous scrotum Incision site on scrotum without purulence drainage Firm hydrocele noted on left side Edematous testicle with diffuse ttp Musculoskeletal:     Cervical back: Neck supple.  Skin:    General: Skin is warm and dry.  Neurological:     General: No focal deficit present.     Mental Status: He is alert and oriented to person, place, and time.  Psychiatric:        Mood and Affect: Mood normal.        Behavior: Behavior normal.     ED Results / Procedures / Treatments   Labs (all labs ordered are listed, but only abnormal results are displayed) Labs Reviewed  URINALYSIS, ROUTINE W REFLEX MICROSCOPIC    EKG None  Radiology No results found.  Procedures Procedures (including critical care time)  Medications Ordered in ED Medications - No data to display  ED Course  I have reviewed the triage vital signs and the nursing notes.  Pertinent labs & imaging results that were available during my care of the patient were reviewed by me and considered in my medical decision making (see chart for details).  74 yo male presenting with persistent scrotal pain and swelling since hydrocele surgery on July 9th with [facility] Dr Alvester Morin.  UA here negative for infection Scrotal ultrasound with findings as noted below Urology consulted by  phone, recommendations below  Patient reports pain controlled, did not want pain medications in the ED.  Doubt sepsis or acute torsion at this time.  Doubt incarcerated hernia.  Okay for discharge w/ close office f/u.  Will treat with keflex TID as advised.  Clinical Course as of Mar 06 4  Wed Mar 05, 2020  1742 IMPRESSION: Large complex hydrocele on the left. Increased vascularity left testicle and epididymis compatible with epididymal orchitis. No mass lesion. Negative for torsion.   [MT]  1747 I spoke to Dr McDiarmod from urology who advised sending urine for culture, initiating keflex 500 mg TID, have patient call their office tomorrow and  they will arrange for rapid f/u appointment.   [MT]    Clinical Course User Index [MT] Zara Wendt, Kermit Balo, MD    Final Clinical Impression(s) / ED Diagnoses Final diagnoses:  Testicle pain  Swelling of testicle    Rx / DC Orders ED Discharge Orders    None       Renaye Rakers Kermit Balo, MD 03/06/20 [phone]

## 2020-03-05 NOTE — ED Notes (Signed)
An After Visit Summary was printed and given to the patient. Discharge instructions given and no further questions at this time.  

## 2020-03-06 ENCOUNTER — Encounter
Admission: RE | Disposition: A | Discharge status: Home / Self Care | Payer: Self-pay | Source: Ambulatory Visit | Attending: Neurological Surgery

## 2020-03-06 ENCOUNTER — Encounter: Payer: Self-pay | Admitting: Neurological Surgery

## 2020-03-06 ENCOUNTER — Ambulatory Visit
Admission: RE | Admit: 2020-03-06 | Discharge: 2020-03-06 | Disposition: A | Discharge status: Home / Self Care | Payer: Medicare Other | Source: Ambulatory Visit | Attending: Neurological Surgery | Admitting: Neurological Surgery

## 2020-03-06 ENCOUNTER — Ambulatory Visit: Payer: Medicare Other

## 2020-03-06 ENCOUNTER — Telehealth (INDEPENDENT_AMBULATORY_CARE_PROVIDER_SITE_OTHER): Payer: Self-pay | Admitting: Neurological Surgery

## 2020-03-06 ENCOUNTER — Ambulatory Visit: Payer: Medicare Other | Admitting: Anesthesiology

## 2020-03-06 DIAGNOSIS — M48061 Spinal stenosis, lumbar region without neurogenic claudication: Secondary | ICD-10-CM | POA: Insufficient documentation

## 2020-03-06 DIAGNOSIS — E785 Hyperlipidemia, unspecified: Secondary | ICD-10-CM | POA: Insufficient documentation

## 2020-03-06 DIAGNOSIS — R011 Cardiac murmur, unspecified: Secondary | ICD-10-CM | POA: Insufficient documentation

## 2020-03-06 DIAGNOSIS — G8929 Other chronic pain: Secondary | ICD-10-CM

## 2020-03-06 DIAGNOSIS — Z974 Presence of external hearing-aid: Secondary | ICD-10-CM | POA: Insufficient documentation

## 2020-03-06 DIAGNOSIS — I10 Essential (primary) hypertension: Secondary | ICD-10-CM | POA: Insufficient documentation

## 2020-03-06 DIAGNOSIS — M5136 Other intervertebral disc degeneration, lumbar region: Secondary | ICD-10-CM

## 2020-03-06 DIAGNOSIS — Z7982 Long term (current) use of aspirin: Secondary | ICD-10-CM | POA: Insufficient documentation

## 2020-03-06 DIAGNOSIS — H9190 Unspecified hearing loss, unspecified ear: Secondary | ICD-10-CM | POA: Insufficient documentation

## 2020-03-06 DIAGNOSIS — M545 Low back pain: Secondary | ICD-10-CM

## 2020-03-06 DIAGNOSIS — M48062 Spinal stenosis, lumbar region with neurogenic claudication: Secondary | ICD-10-CM | POA: Insufficient documentation

## 2020-03-06 HISTORY — PX: LAMINECTOMY, POSTERIOR LUMBAR, DECOMPRESSION, LEVEL 2: SHX4462

## 2020-03-06 SURGERY — LAMINECTOMY, POSTERIOR LUMBAR, DECOMPRESSION, LEVEL 2
Anesthesia: Anesthesia General | Site: Spine Lumbar | Laterality: Bilateral | Wound class: Clean

## 2020-03-06 MED ORDER — MIDAZOLAM HCL 1 MG/ML IJ SOLN (WRAP)
INTRAMUSCULAR | Status: DC | PRN
Start: 2020-03-06 — End: 2020-03-06
  Administered 2020-03-06: 2 mg via INTRAVENOUS

## 2020-03-06 MED ORDER — PROPOFOL 10 MG/ML IV EMUL (WRAP)
INTRAVENOUS | Status: AC
Start: 2020-03-06 — End: ?
  Filled 2020-03-06: qty 20

## 2020-03-06 MED ORDER — PROPOFOL 10 MG/ML IV EMUL (WRAP)
INTRAVENOUS | Status: DC | PRN
Start: 2020-03-06 — End: 2020-03-06
  Administered 2020-03-06: 30 mg via INTRAVENOUS
  Administered 2020-03-06: 50 mg via INTRAVENOUS
  Administered 2020-03-06: 160 mg via INTRAVENOUS

## 2020-03-06 MED ORDER — FENTANYL CITRATE (PF) 50 MCG/ML IJ SOLN (WRAP)
INTRAMUSCULAR | Status: DC | PRN
Start: 2020-03-06 — End: 2020-03-06
  Administered 2020-03-06 (×3): 50 ug via INTRAVENOUS

## 2020-03-06 MED ORDER — EPHEDRINE SULFATE 50 MG/ML IJ/IV SOLN (WRAP)
Status: DC | PRN
Start: 2020-03-06 — End: 2020-03-06
  Administered 2020-03-06 (×3): 5 mg via INTRAVENOUS

## 2020-03-06 MED ORDER — GELATIN ABSORBABLE 100 EX MISC
CUTANEOUS | Status: DC | PRN
Start: 2020-03-06 — End: 2020-03-06
  Administered 2020-03-06: 1 via TOPICAL

## 2020-03-06 MED ORDER — NEOSTIGMINE METHYLSULFATE 1 MG/ML IJ/IV SOLN (WRAP)
Status: AC
Start: 2020-03-06 — End: ?
  Filled 2020-03-06: qty 10

## 2020-03-06 MED ORDER — DEXTROSE 5 % IV SOLN
2.0000 g | INTRAVENOUS | Status: DC
Start: 2020-03-06 — End: 2020-03-06

## 2020-03-06 MED ORDER — THROMBIN 5000 UNITS EX SOLR
CUTANEOUS | Status: DC | PRN
Start: 2020-03-06 — End: 2020-03-06
  Administered 2020-03-06: 5000 [IU] via TOPICAL

## 2020-03-06 MED ORDER — VANCOMYCIN HCL 1 G IV SOLR
INTRAVENOUS | Status: AC
Start: 2020-03-06 — End: ?
  Filled 2020-03-06: qty 1000

## 2020-03-06 MED ORDER — LIDOCAINE 1% BUFFERED - CNR/OUTSOURCED
0.3000 mL | Freq: Once | INTRAMUSCULAR | Status: AC
Start: 2020-03-06 — End: 2020-03-06
  Administered 2020-03-06: 06:00:00 0.3 mL via INTRADERMAL

## 2020-03-06 MED ORDER — CEFAZOLIN SODIUM 1 G IJ SOLR
2.0000 g | Freq: Once | INTRAMUSCULAR | Status: AC
Start: 2020-03-06 — End: 2020-03-06
  Administered 2020-03-06: 08:00:00 2 g via INTRAVENOUS

## 2020-03-06 MED ORDER — POLYETHYLENE GLYCOL 3350 17 G PO PACK
17.0000 g | PACK | Freq: Every day | ORAL | 0 refills | Status: DC
Start: 2020-03-06 — End: 2020-03-17

## 2020-03-06 MED ORDER — PHENYLEPHRINE 100 MCG/ML IV BOLUS (ANESTHESIA)
PREFILLED_SYRINGE | INTRAVENOUS | Status: DC | PRN
Start: 2020-03-06 — End: 2020-03-06
  Administered 2020-03-06 (×3): 50 ug via INTRAVENOUS
  Administered 2020-03-06: 100 ug via INTRAVENOUS

## 2020-03-06 MED ORDER — CEFAZOLIN SODIUM 1 G IJ SOLR
INTRAMUSCULAR | Status: AC
Start: 2020-03-06 — End: 2020-03-06
  Filled 2020-03-06: qty 2000

## 2020-03-06 MED ORDER — LIDOCAINE HCL 2 % IJ SOLN
INTRAMUSCULAR | Status: DC | PRN
Start: 2020-03-06 — End: 2020-03-06
  Administered 2020-03-06: 100 mg

## 2020-03-06 MED ORDER — ONDANSETRON HCL 4 MG/2ML IJ SOLN
INTRAMUSCULAR | Status: DC | PRN
Start: 2020-03-06 — End: 2020-03-06
  Administered 2020-03-06: 4 mg via INTRAVENOUS

## 2020-03-06 MED ORDER — ONDANSETRON HCL 4 MG/2ML IJ SOLN
INTRAMUSCULAR | Status: AC
Start: 2020-03-06 — End: ?
  Filled 2020-03-06: qty 2

## 2020-03-06 MED ORDER — SODIUM CHLORIDE 0.9% BAG (IRRIGATION USE)
INTRAVENOUS | Status: DC | PRN
Start: 2020-03-06 — End: 2020-03-06
  Administered 2020-03-06: 1000 mL

## 2020-03-06 MED ORDER — OXYCODONE-ACETAMINOPHEN 5-325 MG PO TABS
1.0000 | ORAL_TABLET | ORAL | 0 refills | Status: AC | PRN
Start: 2020-03-06 — End: 2020-03-13

## 2020-03-06 MED ORDER — GLYCOPYRROLATE 0.2 MG/ML IJ SOLN (WRAP)
INTRAMUSCULAR | Status: DC | PRN
Start: 2020-03-06 — End: 2020-03-06
  Administered 2020-03-06: .6 mg via INTRAVENOUS

## 2020-03-06 MED ORDER — BUPIVACAINE HCL 0.25 % IJ SOLN
INTRAMUSCULAR | Status: DC | PRN
Start: 2020-03-06 — End: 2020-03-06
  Administered 2020-03-06: 11 mL

## 2020-03-06 MED ORDER — LIDOCAINE-EPINEPHRINE 1 %-1:100000 IJ SOLN
INTRAMUSCULAR | Status: AC
Start: 2020-03-06 — End: ?
  Filled 2020-03-06: qty 30

## 2020-03-06 MED ORDER — GELATIN ABSORBABLE 100 EX MISC
CUTANEOUS | Status: AC
Start: 2020-03-06 — End: ?
  Filled 2020-03-06: qty 1

## 2020-03-06 MED ORDER — LIDOCAINE HCL (PF) 2 % IJ SOLN
INTRAMUSCULAR | Status: AC
Start: 2020-03-06 — End: ?
  Filled 2020-03-06: qty 5

## 2020-03-06 MED ORDER — NEOSTIGMINE METHYLSULFATE 1 MG/ML IJ/IV SOLN (WRAP)
Status: DC | PRN
Start: 2020-03-06 — End: 2020-03-06
  Administered 2020-03-06: 4 mg via INTRAVENOUS

## 2020-03-06 MED ORDER — THROMBIN (RECOMBINANT) 5000 UNITS EX SOLR
CUTANEOUS | Status: AC
Start: 2020-03-06 — End: ?
  Filled 2020-03-06: qty 5000

## 2020-03-06 MED ORDER — EPHEDRINE SULFATE 50 MG/ML IJ/IV SOLN (WRAP)
Status: AC
Start: 2020-03-06 — End: ?
  Filled 2020-03-06: qty 1

## 2020-03-06 MED ORDER — DEXAMETHASONE SOD PHOSPHATE PF 10 MG/ML IJ SOLN
INTRAMUSCULAR | Status: AC
Start: 2020-03-06 — End: ?
  Filled 2020-03-06: qty 1

## 2020-03-06 MED ORDER — MIDAZOLAM HCL 1 MG/ML IJ SOLN (WRAP)
INTRAMUSCULAR | Status: AC
Start: 2020-03-06 — End: ?
  Filled 2020-03-06: qty 2

## 2020-03-06 MED ORDER — PHENYLEPHRINE HCL 10 MG/ML IV SOLN (WRAP)
Status: AC
Start: 2020-03-06 — End: ?
  Filled 2020-03-06: qty 1

## 2020-03-06 MED ORDER — KETAMINE HCL 100 MG/ML IJ SOLN
INTRAMUSCULAR | Status: AC
Start: 2020-03-06 — End: ?
  Filled 2020-03-06: qty 5

## 2020-03-06 MED ORDER — BUPIVACAINE HCL (PF) 0.25 % IJ SOLN
INTRAMUSCULAR | Status: AC
Start: 2020-03-06 — End: ?
  Filled 2020-03-06: qty 30

## 2020-03-06 MED ORDER — FENTANYL CITRATE (PF) 50 MCG/ML IJ SOLN (WRAP)
INTRAMUSCULAR | Status: AC
Start: 2020-03-06 — End: ?
  Filled 2020-03-06: qty 2

## 2020-03-06 MED ORDER — DEXAMETHASONE SODIUM PHOSPHATE 4 MG/ML IJ SOLN (WRAP)
INTRAMUSCULAR | Status: DC | PRN
Start: 2020-03-06 — End: 2020-03-06
  Administered 2020-03-06: 10 mg via INTRAVENOUS

## 2020-03-06 MED ORDER — ROCURONIUM BROMIDE 50 MG/5ML IV SOLN
INTRAVENOUS | Status: AC
Start: 2020-03-06 — End: ?
  Filled 2020-03-06: qty 5

## 2020-03-06 MED ORDER — KETAMINE HCL 50 MG/ML IJ SOLN
INTRAMUSCULAR | Status: DC | PRN
Start: 2020-03-06 — End: 2020-03-06
  Administered 2020-03-06: 25 mg via INTRAVENOUS

## 2020-03-06 MED ORDER — LACTATED RINGERS IV SOLN
INTRAVENOUS | Status: DC
Start: 2020-03-06 — End: 2020-03-06

## 2020-03-06 MED ORDER — VANCOMYCIN HCL 1 G IV SOLR
INTRAVENOUS | Status: DC | PRN
Start: 2020-03-06 — End: 2020-03-06
  Administered 2020-03-06: 1000 mg

## 2020-03-06 MED ORDER — GLYCOPYRROLATE 1 MG/5ML IJ SOLN
INTRAMUSCULAR | Status: AC
Start: 2020-03-06 — End: ?
  Filled 2020-03-06: qty 5

## 2020-03-06 MED ORDER — LIDOCAINE-EPINEPHRINE 1 %-1:100000 IJ SOLN
INTRAMUSCULAR | Status: DC | PRN
Start: 2020-03-06 — End: 2020-03-06
  Administered 2020-03-06: 10 mL

## 2020-03-06 MED ORDER — ROCURONIUM BROMIDE 10 MG/ML IV SOLN (WRAP)
INTRAVENOUS | Status: DC | PRN
Start: 2020-03-06 — End: 2020-03-06
  Administered 2020-03-06: 10 mg via INTRAVENOUS
  Administered 2020-03-06: 50 mg via INTRAVENOUS
  Administered 2020-03-06 (×3): 10 mg via INTRAVENOUS

## 2020-03-06 MED ORDER — CYCLOBENZAPRINE HCL 5 MG PO TABS
5.0000 mg | ORAL_TABLET | Freq: Three times a day (TID) | ORAL | 1 refills | Status: AC | PRN
Start: 2020-03-06 — End: 2020-03-26

## 2020-03-06 SURGICAL SUPPLY — 93 items
BLADE ELECTRODE INSULATED (Cautery) ×1
BULB DRAINAGE LIGHTWEIGHT LOW LEVEL (Drain) IMPLANT
BULB DRAINAGE LIGHTWEIGHT LOW LEVEL SUCTION RELIAVAC SILICONE 100 CC (Drain) IMPLANT
BULB DRN SIL 100CC LF STRL LTWT LO LVL (Drain)
BUR DISSECTING MATCH 14CMX30MM (Burr) ×1
CATH IV INSYTE AUTO 14GX1.75IN (IV Supply) ×1
CATHETER IV BD VLN STD INST ATGRD 14GA (IV Supply) ×1
CATHETER IV OD14 GA L1.75 IN SHIELD (IV Supply) ×1 IMPLANT
CATHETER IV OD14 GA L1.75 IN SHIELD NOTCH NEEDLE PUSH BUTTON DEHP FREE (IV Supply) ×1 IMPLANT
DRAIN BLAKE 15F ROUND (Drain)
DRAIN INCS SIL FULL FLUT RND 15FR 3/16IN (Drain)
DRAIN OD15 FR RADIOPAQUE 4 FREE FLOW (Drain) IMPLANT
DRAIN OD15 FR RADIOPAQUE 4 FREE FLOW CHANNEL CHANNEL DRAIN L3/16 IN (Drain) IMPLANT
DRAN EVACUATOR WOUND 100CC (Drain)
DRAPE EQP MIC XLNG XW VISIONGUARD OPM 52 (Drape) ×1
DRAPE PENTERO 900 MICRO 154X52 (Drape) ×1
DRAPE SRG SMS 43528 PRXM 77X53IN LF STRL (Drape) ×2
DRAPE SRGCL FANFOLD ABSRBNT RNFRCMNT 77X53IN PRXM SMS 43528 44X23IN (Drape) ×2 IMPLANT
DRAPE SURG 3/4 REINFRC 53X77IN (Drape) ×2
DRAPE SURGICAL FANFOLD ABSORBENT (Drape) ×2 IMPLANT
DRAPE SURGICAL TYPE 26 ZEISS (Drape) ×1 IMPLANT
DRESSING TEGADERM 4X4X3/4IN (Dressing)
DRESSING TRANSPARENT L4 3/4 IN X W4 IN (Dressing) IMPLANT
DRESSING TRANSPARENT L4 3/4 IN X W4 IN POLYURETHANE ADHESIVE (Dressing) IMPLANT
DRESSING TRNS PU STD TGDRM 4.75X4IN LF (Dressing)
ELECTRODE ELECTROSURGICAL BLADE L4 IN (Cautery) ×1 IMPLANT
ELECTRODE ELECTROSURGICAL BLADE L4 IN OD3/32 IN EDGE L.2 IN INSULATE (Cautery) ×1 IMPLANT
ELECTRODE ESURG BLDE EDG 3/32IN 4IN LF (Cautery) ×1
GLOVE SRG 7.5 BGL SSNTV LTX STRL PF BEAD (Glove) ×2
GLOVE SRG NTR RBR 7.5 BGL IND 298X96MM (Glove) ×1
GLOVE SURG BIOGEL INDIC SZ 7.5 (Glove) ×1
GLOVE SURG STRL SENSITIVE 7.5 (Glove) ×2
GLOVE SURGICAL 7 1/2 BIOGEL (Glove) ×2 IMPLANT
GLOVE SURGICAL 7 1/2 BIOGEL INDICATOR (Glove) ×1 IMPLANT
GLOVE SURGICAL 7 1/2 BIOGEL INDICATOR POWDER FREE BEAD CUFF UNDERGLOVE (Glove) ×1 IMPLANT
GLOVE SURGICAL 7 1/2 BIOGEL SUPER-SENSITIVE POWDER FREE BEAD CUFF (Glove) ×2 IMPLANT
KIT DURAPREP ANTIMICROBIAL (Prep)
KIT HEMOSTATIC MALLEABLE APPLICATOR FLOSEAL 13CM MATRIX 5ML (Hemostat) ×1 IMPLANT
KIT HMST MTRX 5ML 13CM FLSL MLBL APL (Hemostat) ×1 IMPLANT
PATTIES SURG COTTON 1/2 X 3 (Sponge) ×1
SEALANT FLOSEAL FAST PREP 5ML (Hemostat) ×1
SEALANT FLOSEAL HEMO MTRX  5ML (Hemostat) ×1
SOLUTION BETADINE 4OZ (Scrub Supplies)
SOLUTION SRGPRP 74% ISPRP 0.7% IOD (Prep)
SOLUTION SRGSCRB 10% PVP IOD 4OZ PLS BTL (Scrub Supplies) IMPLANT
SOLUTION SURGICAL PREP 26 ML DURAPREP (Prep) IMPLANT
SOLUTION SURGICAL PREP 26 ML DURAPREP 74% ISOPROPYL ALCOHOL 0.7% (Prep) IMPLANT
SOLUTION SURGICAL SCRUB 10% PVP IODINE 4OZ PLASTIC BOTTLE AQUEOUS SKIN (Scrub Supplies) IMPLANT
SPHERE NAVIGATION STEALTHSTATION (Procedure Accessories) ×3 IMPLANT
SPHERE STEALTHSTATION 5PK (Procedure Accessories) ×3
SPONGE GAUZE 12PLY STRL 4X4IN (Sponge)
SPONGE GAUZE L4 IN X W4 IN 12 PLY CURITY (Sponge) IMPLANT
SPONGE GZE CTTN CRTY 4X4IN LF STRL 12 (Sponge)
SPONGE SRG CTTND SUTUREWELD 3X.5IN LF (Sponge) ×1
SPONGE SURGICAL L3 IN X W1/2 IN (Sponge) ×1 IMPLANT
SPONGE SURGICAL L3 IN X W1/2 IN ABSORBENT PRECISION CUT RADIOPAQUE (Sponge) ×1 IMPLANT
SPONGE SURGICEL HEMO 4X8IN (Hemostat) IMPLANT
STAPLER SKIN L3.9 MM X W6.9 MM WIDE 35 (Skin Closure) ×1 IMPLANT
STAPLER SKIN L3.9 MM X W6.9 MM WIDE 35 COUNT FIX HEAD RATCHET (Skin Closure) ×1 IMPLANT
STAPLER SKIN PROXIMATE WIDE (Skin Closure) ×1
STAPLER SKN SS W PRX PX .58MM 3.9X6.9MM (Skin Closure) ×1
SUTURE ABS 2-0 CP-2 VCL 18IN CR BRD 8 (Suture) ×1
SUTURE ABS 3-0 CP-2 VCL 18IN CR BRD 8 (Suture) ×1
SUTURE ABS 3-0 PS2 MNCRL MTPS 27IN MFL (Suture) ×1
SUTURE COATED VICRYL 2-0 CP-2 L18 IN (Suture) ×1 IMPLANT
SUTURE COATED VICRYL 3-0 CP-2 L18 IN (Suture) ×1 IMPLANT
SUTURE MONOCRYL 3-0 PS-2 L27 IN (Suture) ×1 IMPLANT
SUTURE MONOCRYL 3-0 PS-2 L27 IN MONOFILAMENT UNDYED ABSORBABLE (Suture) ×1 IMPLANT
SUTURE MONOCRYL 3-0 PS2 27IN (Suture) ×1
SUTURE VICRYL 2-0 CP2 8X18IN (Suture) ×1
SUTURE VICRYL 3-0 CP2 8X18IN (Suture) ×1
SYRINGE 10 ML GRADUATE NONPYROGENIC DEHP (Syringes, Needles) IMPLANT
SYRINGE 10 ML GRADUATE NONPYROGENIC DEHP FREE PVC FREE LOK MEDICAL (Syringes, Needles) IMPLANT
SYRINGE 20 ML BD LUER-LOK MEDICAL (Syringes, Needles) ×1 IMPLANT
SYRINGE LUER LOCK 10CC (Syringes, Needles)
SYRINGE LUER-LOK STERILE 20CC (Syringes, Needles) ×1
SYRINGE MED 10ML LL LF STRL GRAD N-PYRG (Syringes, Needles)
SYRINGE MED 20ML LL LF STRL (Syringes, Needles) ×1 IMPLANT
TOOL DISSECTING L14 CM MATCH HEAD FLUTE (Burr) ×1 IMPLANT
TOOL DISSECTING L14 CM MATCH HEAD FLUTE OD3 MM MIDAS REX LEGEND (Burr) ×1 IMPLANT
TOOL DSCT LGND 3MM 14MM (Burr) ×1
TOWEL L26 IN X W17 IN COTTON PREWASH (Procedure Accessories) IMPLANT
TOWEL L26 IN X W17 IN COTTON PREWASH DELINT BLUE ACTISORB SURGICAL (Procedure Accessories) IMPLANT
TOWEL OR STRL BLUE 17X26IN (Procedure Accessories)
TOWEL SRG CTTN 26X17IN LF STRL PREWASH (Procedure Accessories)
TRAY SPINE SPINE LH (Pack) ×1
TRAY SRG SPINE SPINE ILH (Pack) ×1 IMPLANT
TRAY STRL SPHERES (Procedure Accessories) ×3
TRAY SURESTEP FOLEY CATH 14F (Catheter Urine) IMPLANT
TRAY SURGICAL SPINE ILH (Pack) ×1 IMPLANT
WIPE PERSONAL L7.9 IN X W7.9 IN POST INSERTION FOLEY SURESTEP PULP (Patient Supply) IMPLANT
WIPE PROVON SURESTEP FOLEY (Patient Supply)
WIPE PRSNL PULP PP SRSTP 7.9X7.9IN LF NS (Patient Supply) IMPLANT

## 2020-03-06 NOTE — Transfer of Care (Signed)
Anesthesia Transfer of Care Note    Patient: Frederick [name]    Procedures performed: Procedure(s) with comments:  BILATERAL L2-L4 LAMINECTOMY - BILATERAL L2-L4 LAMINECTOMIES    Anesthesia type: General ETT    Patient location:Phase I PACU    Last vitals:   Vitals:    03/06/20 1100   BP: 134/65   Pulse: 80   Resp: 20   Temp: 36.6 C (97.8 F)   SpO2: 95%       Post pain: Patient not complaining of pain, continue current therapy      Mental Status:awake    Respiratory Function: tolerating room air    Cardiovascular: stable    Nausea/Vomiting: patient not complaining of nausea or vomiting    Hydration Status: adequate    Post assessment: no apparent anesthetic complications    Signed byManus Rudd  03/06/20 11:05 AM

## 2020-03-06 NOTE — Telephone Encounter (Signed)
8.12.2021  Left patient a voicemail regarding scheduled post-op appointments, need confirmation

## 2020-03-06 NOTE — Discharge Instructions (Signed)

## 2020-03-06 NOTE — Progress Notes (Addendum)
Patient's wife updated via telephone.     11:42 AM   Patient tolerating ice chips at this time. Attempting to urinate via urinal - not successful at this time.   12:42 PM  Patient ambulating from phase 1 stretcher to phase 2 chair without difficulty. Patient also ambulated to bathroom, unable to urinate at this time.

## 2020-03-06 NOTE — OR Nursing (Signed)
Pt stated in pre-op interview pain is bilateral but worse on the right side.

## 2020-03-06 NOTE — Progress Notes (Signed)
NS Post-op Progress Note:    POD#0 bilateral L2-L4 laminectomies today with Dr. Rayann Heman for chronic axial low back pain. No intra-op complications, extubated uneventfully and transferred to PACU ins table condition. He denies any pain at this time.    Vitals:    03/06/20 1120   BP: 129/66   Pulse: 64   Resp: (!) 6   Temp:    SpO2: 94%     NAD, awake and alert  CN II-XII grossly intact  5/5 strength BLEs  SILT   Dressing clean, dry, intact    Plan:  -Discharge home today once PACU criteria met  -Pain control w/ Percocet and valium prn  -Bowel regimen  -Elevate HOB to 30  -No brace or post-op imaging needed  -ADAT  -Remove dressing tomorrow am and start daily wound wash  -Follow up in clinic in 7-10 days for incision check       Alessandra Bevels, PA-C  Neurosurgery PA with Dr. Rayann Heman  Pager: [phone] 8am-5pm M-F  Weekends and after hours call [phone]   *for medical concerns/orders please call/page primary medicine team*

## 2020-03-06 NOTE — PACU (Addendum)
Pt ambulated from PACU bay 3 to preop room 16 successfully and to restroom. Tolerating fluids. Wife at bedside.    Called back to OR room to ask Dr. Rayann Heman about when pt can resume baby aspirin - he said in 7 days.    Pt able to urinate and would like to be discharged home.    Patient states readiness to be discharged to home.  Patient states pain and nausea managed at this time.  Patient and family verbalize understanding of written discharge instructions as reviewed with nursing staff, home medications, plan of care for pain/nausea management, and activity at home and awareness of available resources to contact from home as necessary. Last and future doses of pain medication reviewed and noted. Location of e-prescribed medications identified.  Plan to discharge to home with all belongings and printed paper instructions in discharge folder.

## 2020-03-06 NOTE — Discharge Instr - AVS First Page (Signed)
Sidhartha Chandela, MD  Department of Neurosciences  [address], [address]  [place], [place] [address]  Phone: [phone] and Fax: [phone]    Post-operative (after) Expectations for Lumbar Laminectomy Surgery    EXPECTATIONS OF RECOVERY:  • After surgery, you can expect to feel a little weak and tired. It is normal to feel some discomfort around your incision and the muscles near your incision.   • There may be some residual numbness and weakness after surgery, which may take months to go away, and may not ever completely go away.    • The pain will often increase right after surgery and then decrease over time.    INCISION/DRESSING CARE:  • Your incision will either be closed with surgical tape or absorbable stitches. If there is a dressing over your incision, it must be removed the day after surgery.  • Shower daily and gently clean the incision with soap and water, pat dry.   • We recommend keeping your incision open to air at all times.   • You will need to return to the office 7-10 days after surgery to have the incision checked.   • No swimming, bathing or soaking the incision for one month after surgery.    ACTIVITY:  • We recommend that you do not bend or lift anything greater than about 10 lbs for the first 4 weeks after surgery.  • It is very important to get up and moving at home, but it is equally important to not overdo activities. You will be able to walk, take stairs (carefully and slowly), use the bathroom, and get up from sitting to standing using the arms of the chair as support.   • You can start walking the day you get home from surgery, and slowly increase your activity back to your baseline. For example, increase from a short, less than 5 minute walk the first few days, to around the block and up to 15-20 minutes after a week.   • Do not bend, twist or lean to pick things up or change positions. Remember good back mechanics and use your legs to sit down and stand up. We do not  recommend starting outpatient physical therapy until after your first post-surgery appointment.     WORK:  • We recommend taking off at least 2-4 weeks before returning to work.   • We do not recommend you return to work until you have stopped taking the prescription pain medications.   • If you need short term disability or family medical leave act paperwork to be filled out, please have that available after surgery. We automatically fill it out for 4 weeks, with possible extension to three months.  Anything greater than three months or long term disability will need to be filled out by your primary care physician. We do not fill out forms prior to surgery.    DRIVING:  • You may start driving once you are no longer taking prescription pain medications.  • We recommend that you drive with someone the first time to ensure you are safe to drive.     MEDICATION:  • You will be prescribed pain medications, a muscle relaxant, and a stool softener. The pain medication can cause constipation, so it is important to take the stool softener daily while you are taking the pain medication.   • The muscle relaxant will help with back pain that is more musculoskeletal or spasms.   • For medication questions, please contact   the physician assistant Gentri Guardado at [email or web address]    FOLLOW-UP APPOINTMENTS:   • You will follow up 7-10 days after surgery for a wound check with Drinda Belgard, Pa-C.  • You will see Dr. Chandela 4-6 weeks after surgery for a follow up.    • Your appointments are usually scheduled for you, but if they are not please contact Tynaea Brown at [phone] or e-mail [email or web address] to get them scheduled.   • If you have any questions about your post-operative care you may email [email or web address]

## 2020-03-07 ENCOUNTER — Other Ambulatory Visit: Payer: Self-pay | Admitting: Urology

## 2020-03-07 ENCOUNTER — Other Ambulatory Visit: Payer: Self-pay | Admitting: Internal Medicine

## 2020-03-07 ENCOUNTER — Encounter (HOSPITAL_BASED_OUTPATIENT_CLINIC_OR_DEPARTMENT_OTHER): Payer: Self-pay | Admitting: Urology

## 2020-03-07 ENCOUNTER — Other Ambulatory Visit: Payer: Self-pay

## 2020-03-07 DIAGNOSIS — Z7952 Long term (current) use of systemic steroids: Secondary | ICD-10-CM

## 2020-03-07 LAB — URINE CULTURE: Culture: NO GROWTH

## 2020-03-07 NOTE — Op Note (Signed)
FULL OPERATIVE NOTE    Date Time: 03/07/20 6:28 PM  Patient Name: Donn Pierini DAVID  Attending Physician: No att. providers found      Date of Operation:   03/06/2020    Providers Performing:   Surgeon(s):  Jerilynn Mages, MD    Circulator: Koren Shiver, RN  Scrub Person: Eston Esters, RN  Second Circulator: Acquanetta Sit, RN    Operative Procedure:   1.  Bilateral decompressive lumbar laminectomy, medial facetectomy and foraminotomies to decompress the thecal sac and nerve roots, L2-3 and L3-4  2.  Use of the operating microscope for microdissection technique  3.  Use of O-arm stereotactic navigation  4.  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  Imaging reveals lumbar spinal stenosis and degenerative disc disease L2-3 and L3-4.  The patient presents for decompressive lumbar laminectomy.  All risks, benefits, alternatives were explained to the patient and his wife.  The risks include but not limited to problems with anesthesia, devastating neurologic injury, vascular injury, CSF leak, infection, reoperation, no improvement of symptoms etc.  The patient understands all this and wishes to proceed.    Operative Notes:   After the patient was brought to the operating room, general endotracheal anesthesia was administered uneventfully.  Peripheral IV lines and Foley catheters were inserted.  Perioperative antibiotics and steroids were administered.  The patient was turned prone on a Wilson frame and arms were placed in  overhead position.  All points of pressure were padded accordingly.  The patient was carefully secured to the table.  A midline incision spanning from L to down to 4 was planned.  The lower back was washed, prepped, draped in usual sterile fashion.  All the O-arm navigation instrumentation was registered.  A timeout was carried out upon the protocol.  Lidocaine was infiltrated to the skin incision.  #10 scalpel was used to make an incision.  Dissection was deepened through the subcutaneous tissue to encounter the spinous process of L2.  The O-arm reference array was then affixed to the spinous process.  After this, and was and was carried out and under continuous navigation the desired area of surgery L2-3 and L3-4 were identified.  Further exposure was carried out via a subperiosteal dissection exposing the spinous process of L2-L3 and L4 along with the lamina and the facet.  Self-retaining McCullough retractor system was employed for exposure of the wound.  At this point, the microscope was brought in for microdissection technique.    Using navigation, the limits of our surgery were identified.  Spinous process of L2, L3 and L4 were removed with [employer].  Further lamina was removed with Leksell rongeurs of L2-L3 and top of L4.  Following this, a high-speed navigated drill was utilized to drill the lamina of L2, L3 and L4 centrally until the bone was thinned to an eggshell.  Curettes were used to delineate the bony anatomy separate from the ligamentum flavum.  The thinned bone was then removed with a series of Kerrisons #2, #3, #  4.  Bony decompression was then carried out laterally with Leksell rongeurs as well as Kerrisons #2 #3 #4 all the way up to the facet where a medial facetectomy was carried out.  Woodson dissectors were used to dissect the ligamentum flavum which was hypertrophied from the dura in the epidural space.  This ligamentum was then resected with Kerrison rongeurs.  Further  lateral decompression was carried out with Kerrisons.  Woodson dissector was utilized to palpate the pedicles and the location of the foramen.    The navigation wand was utilized to assess the foramen and delineate our decompression.  At each level the nerve roots were identified at their origin and followed as they rounded around the pedicle into the lateral recess and all the way into the foramen.  Aggressive foraminotomies with further decompress the foramen.  After decompression the central dura was widely decompressed and all the nerve roots, L2, L3 and L4 were identified and free and clear in the past bilaterally.    At this point the wound was copiously irrigated with antibiotic saline solution.  Meticulous and careful hemostasis was achieved with bipolar cautery.  The wound was then also irrigated with half mixture of Betadine and saline.  Following this, several Valsalva maneuvers were carried out which did not reveal any CSF leak.  Topical 4% plain Marcaine was applied to the dura.  The self-retaining retractor was removed and the wound was closed in layers.  The fascia was reapproximated and closed with 2-0 Vicryl interrupted sutures.  The subcutaneous tissue was reapproximated closed with 3-0 Vicryl suture.  The skin was closed with a running subcuticular 303 0 Monocryl stitch.  Steri-Strips applied.  A clean sterile dressing was applied.  Supine and extubated moving all extremities and transferred to PACU for recovery.  There were no intraoperative complications.  There was a specimen.  The patient tolerated the procedure.    Estimated Blood Loss:   50 mL    Implants:   none    Drains:   none    Specimens:   none      Complications:   None                Signed by: Jerilynn Mages, MD

## 2020-03-07 NOTE — Progress Notes (Addendum)
Spoke w/ via phone for pre-op interview---PT Lab needs dos----   I STAT 8, EKG            Lab results COVID test ------03-10-2020 at 1200 pm due to transportation needs (pt is blind) Arrive at -------730 am 03-12-2020 NPO after MN NO Solid Food.  Clear liquids from MN until---630 am then npo Medications to take morning of surgery -----pepcid, atorvastatin, amlodipine,  Diabetic medication -----n/a Patient Special Instructions -----none Pre-Op special Istructions -----none Patient verbalized understanding of instructions that were given at this phone interview. Patient denies shortness of breath, chest pain, fever, cough at this phone interview.  Abdominal aorta US 03-11-2020 epic  Patient is blind, wife Austin Carter has poa if needed and usually signs consents, pt can sidn consent if needed

## 2020-03-08 ENCOUNTER — Other Ambulatory Visit (HOSPITAL_COMMUNITY): Payer: Medicare HMO

## 2020-03-10 ENCOUNTER — Other Ambulatory Visit: Payer: Self-pay | Admitting: Internal Medicine

## 2020-03-10 ENCOUNTER — Ambulatory Visit
Admission: RE | Admit: 2020-03-10 | Discharge: 2020-03-10 | Disposition: A | Discharge status: Home / Self Care | Payer: Medicare HMO | Source: Ambulatory Visit | Attending: Internal Medicine | Admitting: Internal Medicine

## 2020-03-10 ENCOUNTER — Other Ambulatory Visit (HOSPITAL_COMMUNITY)
Admission: RE | Admit: 2020-03-10 | Discharge: 2020-03-10 | Disposition: A | Discharge status: Home / Self Care | Payer: Medicare HMO | Source: Ambulatory Visit | Attending: Urology | Admitting: Urology

## 2020-03-10 DIAGNOSIS — Z01812 Encounter for preprocedural laboratory examination: Secondary | ICD-10-CM | POA: Diagnosis present

## 2020-03-10 DIAGNOSIS — Z20822 Contact with and (suspected) exposure to covid-19: Secondary | ICD-10-CM | POA: Diagnosis not present

## 2020-03-10 DIAGNOSIS — Z136 Encounter for screening for cardiovascular disorders: Secondary | ICD-10-CM

## 2020-03-10 DIAGNOSIS — I159 Secondary hypertension, unspecified: Secondary | ICD-10-CM

## 2020-03-10 LAB — SARS CORONAVIRUS 2 (TAT 6-24 HRS): SARS Coronavirus 2: NEGATIVE

## 2020-03-10 MED ORDER — LOSARTAN POTASSIUM 100 MG PO TABS
100.0000 mg | ORAL_TABLET | Freq: Every day | ORAL | 3 refills | Status: DC
Start: 2020-03-10 — End: 2020-11-17

## 2020-03-10 NOTE — Anesthesia Postprocedure Evaluation (Signed)
Anesthesia Post Evaluation    Patient: Frederick [name]    Procedure(s) with comments:  BILATERAL L2-L4 LAMINECTOMY - BILATERAL L2-L4 LAMINECTOMIES    Anesthesia type: general    Last Vitals:   Vitals Value Taken Time   BP 126/68 03/06/20 1230   Temp 36.6 C (97.8 F) 03/06/20 1100   Pulse 60 03/06/20 1230   Resp 9 03/06/20 1230   SpO2 97 % 03/06/20 1230                 Anesthesia Post Evaluation:     Patient Evaluated: PACU  Patient Participation: complete - patient participated  Level of Consciousness: awake and alert  Pain Score: 0  Pain Management: adequate    Airway Patency: patent    Anesthetic complications: No      PONV Status: none    Cardiovascular status: acceptable  Respiratory status: acceptable  Hydration status: acceptable        Signed by: Manus Rudd, 03/10/2020 8:11 AM

## 2020-03-11 ENCOUNTER — Encounter: Payer: Self-pay | Admitting: Neurological Surgery

## 2020-03-11 NOTE — Anesthesia Preprocedure Evaluation (Addendum)
Anesthesia Evaluation  Patient identified by MRN, date of birth, ID band Patient awake    Reviewed: Allergy & Precautions, NPO status , Patient's Chart, lab work & pertinent test results  Airway Mallampati: II  TM Distance: >3 FB Neck ROM: Full    Dental no notable dental hx. (+) Teeth Intact, Dental Advisory Given   Pulmonary former smoker,    Pulmonary exam normal breath sounds clear to auscultation       Cardiovascular hypertension, Pt. on medications Normal cardiovascular exam Rhythm:Regular Rate:Normal     Neuro/Psych negative neurological ROS  negative psych ROS   GI/Hepatic negative GI ROS, Neg liver ROS,   Endo/Other  Hypothyroidism   Renal/GU negative Renal ROS  negative genitourinary   Musculoskeletal negative musculoskeletal ROS (+)   Abdominal   Peds  Hematology   Anesthesia Other Findings Pt Blind  Reproductive/Obstetrics negative OB ROS                           Anesthesia Physical Anesthesia Plan  ASA: II  Anesthesia Plan: General   Post-op Pain Management:    Induction: Intravenous  PONV Risk Score and Plan: 3 and Treatment may vary due to age or medical condition, Ondansetron and Dexamethasone  Airway Management Planned: LMA  Additional Equipment: None  Intra-op Plan:   Post-operative Plan:   Informed Consent: I have reviewed the patients History and Physical, chart, labs and discussed the procedure including the risks, benefits and alternatives for the proposed anesthesia with the patient or authorized representative who has indicated his/her understanding and acceptance.     Dental advisory given  Plan Discussed with: CRNA and Anesthesiologist  Anesthesia Plan Comments:        Anesthesia Quick Evaluation

## 2020-03-12 ENCOUNTER — Ambulatory Visit (HOSPITAL_BASED_OUTPATIENT_CLINIC_OR_DEPARTMENT_OTHER): Payer: Medicare HMO | Admitting: Anesthesiology

## 2020-03-12 ENCOUNTER — Encounter (HOSPITAL_BASED_OUTPATIENT_CLINIC_OR_DEPARTMENT_OTHER)
Admission: RE | Disposition: A | Discharge status: Home / Self Care | Payer: Self-pay | Source: Home / Self Care | Attending: Urology

## 2020-03-12 ENCOUNTER — Other Ambulatory Visit: Payer: Self-pay

## 2020-03-12 ENCOUNTER — Encounter (HOSPITAL_BASED_OUTPATIENT_CLINIC_OR_DEPARTMENT_OTHER): Payer: Self-pay | Admitting: Urology

## 2020-03-12 ENCOUNTER — Ambulatory Visit (HOSPITAL_BASED_OUTPATIENT_CLINIC_OR_DEPARTMENT_OTHER)
Admission: RE | Admit: 2020-03-12 | Discharge: 2020-03-12 | Disposition: A | Discharge status: Home / Self Care | Payer: Medicare HMO | Attending: Urology | Admitting: Urology

## 2020-03-12 DIAGNOSIS — Z79899 Other long term (current) drug therapy: Secondary | ICD-10-CM | POA: Insufficient documentation

## 2020-03-12 DIAGNOSIS — H547 Unspecified visual loss: Secondary | ICD-10-CM | POA: Diagnosis not present

## 2020-03-12 DIAGNOSIS — I10 Essential (primary) hypertension: Secondary | ICD-10-CM | POA: Diagnosis not present

## 2020-03-12 DIAGNOSIS — N433 Hydrocele, unspecified: Secondary | ICD-10-CM | POA: Diagnosis present

## 2020-03-12 DIAGNOSIS — E039 Hypothyroidism, unspecified: Secondary | ICD-10-CM | POA: Diagnosis not present

## 2020-03-12 DIAGNOSIS — Z7982 Long term (current) use of aspirin: Secondary | ICD-10-CM | POA: Diagnosis not present

## 2020-03-12 DIAGNOSIS — Z87891 Personal history of nicotine dependence: Secondary | ICD-10-CM | POA: Diagnosis not present

## 2020-03-12 DIAGNOSIS — Z7952 Long term (current) use of systemic steroids: Secondary | ICD-10-CM | POA: Insufficient documentation

## 2020-03-12 DIAGNOSIS — H3552 Pigmentary retinal dystrophy: Secondary | ICD-10-CM | POA: Insufficient documentation

## 2020-03-12 HISTORY — PX: HYDROCELE EXCISION: SHX482

## 2020-03-12 HISTORY — DX: Pigmentary retinal dystrophy: H35.52

## 2020-03-12 LAB — POCT I-STAT, CHEM 8
BUN: 24 mg/dL — ABNORMAL HIGH (ref 8–23)
Calcium, Ion: 1.27 mmol/L (ref 1.15–1.40)
Chloride: 100 mmol/L (ref 98–111)
Creatinine, Ser: 1.4 mg/dL — ABNORMAL HIGH (ref 0.61–1.24)
Glucose, Bld: 93 mg/dL (ref 70–99)
HCT: 39 % (ref 39.0–52.0)
Hemoglobin: 13.3 g/dL (ref 13.0–17.0)
Potassium: 3.8 mmol/L (ref 3.5–5.1)
Sodium: 142 mmol/L (ref 135–145)
TCO2: 26 mmol/L (ref 22–32)

## 2020-03-12 SURGERY — HYDROCELECTOMY
Anesthesia: General | Site: Scrotum | Laterality: Left

## 2020-03-12 MED ORDER — FENTANYL CITRATE (PF) 100 MCG/2ML IJ SOLN
INTRAMUSCULAR | Status: DC | PRN
  Administered 2020-03-12 (×2): 25 ug via INTRAVENOUS
  Administered 2020-03-12: 50 ug via INTRAVENOUS

## 2020-03-12 MED ORDER — DEXAMETHASONE SODIUM PHOSPHATE 10 MG/ML IJ SOLN
INTRAMUSCULAR | Status: DC | PRN
  Administered 2020-03-12: 8 mg via INTRAVENOUS

## 2020-03-12 MED ORDER — FENTANYL CITRATE (PF) 100 MCG/2ML IJ SOLN
INTRAMUSCULAR | Status: AC
  Filled 2020-03-12: qty 2

## 2020-03-12 MED ORDER — CEFAZOLIN SODIUM-DEXTROSE 2-4 GM/100ML-% IV SOLN
2.0000 g | INTRAVENOUS | Status: AC
  Administered 2020-03-12: 2 g via INTRAVENOUS

## 2020-03-12 MED ORDER — TRAMADOL HCL 50 MG PO TABS
50.0000 mg | ORAL_TABLET | Freq: Once | ORAL | Status: AC
  Administered 2020-03-12: 50 mg via ORAL

## 2020-03-12 MED ORDER — FENTANYL CITRATE (PF) 100 MCG/2ML IJ SOLN
25.0000 ug | INTRAMUSCULAR | Status: DC | PRN
  Administered 2020-03-12 (×2): 50 ug via INTRAVENOUS

## 2020-03-12 MED ORDER — CEFAZOLIN SODIUM-DEXTROSE 2-4 GM/100ML-% IV SOLN
INTRAVENOUS | Status: AC
  Filled 2020-03-12: qty 100

## 2020-03-12 MED ORDER — LIDOCAINE 2% (20 MG/ML) 5 ML SYRINGE
INTRAMUSCULAR | Status: DC | PRN
  Administered 2020-03-12: 80 mg via INTRAVENOUS

## 2020-03-12 MED ORDER — PROPOFOL 10 MG/ML IV BOLUS
INTRAVENOUS | Status: DC | PRN
  Administered 2020-03-12: 140 mg via INTRAVENOUS

## 2020-03-12 MED ORDER — BUPIVACAINE HCL 0.25 % IJ SOLN
INTRAMUSCULAR | Status: DC | PRN
  Administered 2020-03-12: 10 mL

## 2020-03-12 MED ORDER — DEXAMETHASONE SODIUM PHOSPHATE 10 MG/ML IJ SOLN
INTRAMUSCULAR | Status: AC
  Filled 2020-03-12: qty 1

## 2020-03-12 MED ORDER — ONDANSETRON HCL 4 MG/2ML IJ SOLN: 4.0000 mg | Freq: Once | INTRAMUSCULAR | Status: DC | PRN

## 2020-03-12 MED ORDER — LACTATED RINGERS IV SOLN: INTRAVENOUS | Status: DC

## 2020-03-12 MED ORDER — ACETAMINOPHEN 10 MG/ML IV SOLN
1000.0000 mg | Freq: Once | INTRAVENOUS | Status: DC | PRN
  Administered 2020-03-12: 1000 mg via INTRAVENOUS

## 2020-03-12 MED ORDER — TRAMADOL HCL 50 MG PO TABS
ORAL_TABLET | ORAL | Status: AC
  Filled 2020-03-12: qty 1

## 2020-03-12 MED ORDER — EPHEDRINE SULFATE-NACL 50-0.9 MG/10ML-% IV SOSY
PREFILLED_SYRINGE | INTRAVENOUS | Status: DC | PRN
  Administered 2020-03-12 (×2): 10 mg via INTRAVENOUS

## 2020-03-12 MED ORDER — LIDOCAINE 2% (20 MG/ML) 5 ML SYRINGE
INTRAMUSCULAR | Status: AC
  Filled 2020-03-12: qty 5

## 2020-03-12 MED ORDER — PROPOFOL 10 MG/ML IV BOLUS
INTRAVENOUS | Status: AC
  Filled 2020-03-12: qty 20

## 2020-03-12 MED ORDER — EPHEDRINE 5 MG/ML INJ
INTRAVENOUS | Status: AC
  Filled 2020-03-12: qty 10

## 2020-03-12 MED ORDER — ONDANSETRON HCL 4 MG/2ML IJ SOLN
INTRAMUSCULAR | Status: DC | PRN
  Administered 2020-03-12: 4 mg via INTRAVENOUS

## 2020-03-12 MED ORDER — ACETAMINOPHEN 10 MG/ML IV SOLN
INTRAVENOUS | Status: AC
  Filled 2020-03-12: qty 100

## 2020-03-12 SURGICAL SUPPLY — 43 items
ADH SKN CLS APL DERMABOND .7 (GAUZE/BANDAGES/DRESSINGS) ×1
BLADE CLIPPER SENSICLIP SURGIC (BLADE) ×2 IMPLANT
BLADE SURG 15 STRL LF DISP TIS (BLADE) ×1 IMPLANT
BLADE SURG 15 STRL SS (BLADE) ×2
BNDG GAUZE ELAST 4 BULKY (GAUZE/BANDAGES/DRESSINGS) ×2 IMPLANT
CANISTER SUCT 1200ML W/VALVE (MISCELLANEOUS) ×2 IMPLANT
CANISTER SUCT 3000ML PPV (MISCELLANEOUS) IMPLANT
CLEANER CAUTERY TIP 5X5 PAD (MISCELLANEOUS) ×1 IMPLANT
COVER BACK TABLE 60X90IN (DRAPES) ×2 IMPLANT
COVER MAYO STAND STRL (DRAPES) ×2 IMPLANT
COVER WAND RF STERILE (DRAPES) ×2 IMPLANT
DERMABOND ADVANCED (GAUZE/BANDAGES/DRESSINGS) ×1
DERMABOND ADVANCED .7 DNX12 (GAUZE/BANDAGES/DRESSINGS) ×1 IMPLANT
DISSECTOR ROUND CHERRY 3/8 STR (MISCELLANEOUS) IMPLANT
DRAIN PENROSE 0.25X18 (DRAIN) ×2 IMPLANT
DRAPE LAPAROTOMY 100X72 PEDS (DRAPES) ×2 IMPLANT
ELECT REM PT RETURN 9FT ADLT (ELECTROSURGICAL) ×2
ELECTRODE REM PT RTRN 9FT ADLT (ELECTROSURGICAL) ×1 IMPLANT
GLOVE BIO SURGEON STRL SZ7.5 (GLOVE) ×2 IMPLANT
GLOVE BIOGEL PI IND STRL 7.0 (GLOVE) ×1 IMPLANT
GLOVE BIOGEL PI IND STRL 7.5 (GLOVE) ×1 IMPLANT
GLOVE BIOGEL PI INDICATOR 7.0 (GLOVE) ×1
GLOVE BIOGEL PI INDICATOR 7.5 (GLOVE) ×1
GLOVE ECLIPSE 7.0 STRL STRAW (GLOVE) ×2 IMPLANT
GOWN STRL REUS W/ TWL XL LVL3 (GOWN DISPOSABLE) ×1 IMPLANT
GOWN STRL REUS W/TWL LRG LVL3 (GOWN DISPOSABLE) ×2 IMPLANT
GOWN STRL REUS W/TWL XL LVL3 (GOWN DISPOSABLE) ×2
HEMOSTAT ARISTA ABSORB 3G PWDR (HEMOSTASIS) ×2 IMPLANT
KIT TURNOVER CYSTO (KITS) ×2 IMPLANT
NEEDLE HYPO 22GX1.5 SAFETY (NEEDLE) ×2 IMPLANT
NS IRRIG 500ML POUR BTL (IV SOLUTION) ×2 IMPLANT
PACK BASIN DAY SURGERY FS (CUSTOM PROCEDURE TRAY) ×2 IMPLANT
PAD CLEANER CAUTERY TIP 5X5 (MISCELLANEOUS) ×1
PENCIL SMOKE EVACUATOR (MISCELLANEOUS) ×2 IMPLANT
SUPPORT SCROTAL LG STRP (MISCELLANEOUS) ×2 IMPLANT
SUT CHROMIC 3 0 SH 27 (SUTURE) ×2 IMPLANT
SUT VIC AB 4-0 PS2 27 (SUTURE) ×2 IMPLANT
SYR CONTROL 10ML LL (SYRINGE) ×2 IMPLANT
TOWEL OR 17X26 10 PK STRL BLUE (TOWEL DISPOSABLE) ×2 IMPLANT
TRAY DSU PREP LF (CUSTOM PROCEDURE TRAY) ×2 IMPLANT
TUBE CONNECTING 12X1/4 (SUCTIONS) ×2 IMPLANT
WATER STERILE IRR 500ML POUR (IV SOLUTION) ×2 IMPLANT
YANKAUER SUCT BULB TIP NO VENT (SUCTIONS) ×2 IMPLANT

## 2020-03-12 NOTE — Anesthesia Procedure Notes (Signed)
Procedure Name: LMA Insertion Date/Time: 03/12/2020 9:10 AM Performed by: Real Cona D, CRNA Pre-anesthesia Checklist: Patient identified, Emergency Drugs available, Suction available and Patient being monitored Patient Re-evaluated:Patient Re-evaluated prior to induction Oxygen Delivery Method: Circle system utilized Preoxygenation: Pre-oxygenation with 100% oxygen Induction Type: IV induction Ventilation: Mask ventilation without difficulty LMA: LMA inserted LMA Size: 4.0 Tube type: Oral Number of attempts: 1 Placement Confirmation: positive ETCO2 and breath sounds checked- equal and bilateral Tube secured with: Tape Dental Injury: Teeth and Oropharynx as per pre-operative assessment

## 2020-03-12 NOTE — Anesthesia Postprocedure Evaluation (Signed)
Anesthesia Post Note  Patient: Austin Carter  Procedure(s) Performed: LEFT HYDROCELECTOMY ADULT (Left Scrotum)     Patient location during evaluation: PACU Anesthesia Type: General Level of consciousness: awake and alert Pain management: pain level controlled Vital Signs Assessment: post-procedure vital signs reviewed and stable Respiratory status: spontaneous breathing, nonlabored ventilation, respiratory function stable and patient connected to nasal cannula oxygen Cardiovascular status: blood pressure returned to baseline and stable Postop Assessment: no apparent nausea or vomiting Anesthetic complications: no   No complications documented.  Last Vitals:  Vitals:   03/12/20 1045 03/12/20 1100  BP: 123/63 118/62  Pulse: 69 70  Resp: 17 13  Temp:    SpO2: 99% 92%    Last Pain:  Vitals:   03/12/20 1045  TempSrc:   PainSc: 5                  Trevor Iha

## 2020-03-12 NOTE — Op Note (Signed)
.  Operative Note  Preoperative diagnosis:  1.  Left recurrent hydrocele  Postoperative diagnosis: 1.  Left recurrent hydrocele  Procedure(s): 1.  Left hydrocelectomy  Surgeon: Modena Slater, MD  Assistants: none  Anesthesia: Gen.  Complications: none immediate  EBL: 25 mL  Specimens: 1. none  Drains/Catheters: 1.  Quarter inch Penrose drain  Condition following The operation: stable  Intraoperative findings: Left hydrocele.  There was inflammation of the testicle as well as the surrounding dartos attached to the hydrocele sac.  Consistent with recent postoperative change.  Entire hydrocele sac excised.  Indication: the patient is a Austin Carter with history left-sided hydrocele status post recent hydrocelectomy had a recurrence of his hydrocele.    Description of procedure:  The patient was identified and consent was obtained. He was taken back to the operating room and placed in the supine position. He was placed under general anesthesia and prepped and draped in the standard sterile fashion.Perioperative antibiotic was given. Timeout was performed. A 4 cm left hemiscrotal longitudinal incision was made along the median raphae. This was carried down through the dartos and the testicle along with the hydrocele sac was delivered onto the operative field.  There was significant inflammation surrounding the hydrocele sac but I was able to release the attachments with a combination of blunt dissection and electrocautery. The gubernacular attachments were released with a combination of blunt dissection and electrocautery. Spot electrocautery was used for hemostasis as necessary. Care was taken to not use electrocautery close to the cord itself. The hydrocele sac was incised and fluid was evacuated. I extended the hydrocele sac incision proximally and distally.  The entire hydrocele sac was excised. This was discarded.  There was not enough hydrocele sac to evert around the testicle  which is the reason I decided just to excise the entire sac. Hemostasis was obtained carefully with Bovie electrocautery. I irrigated the scrotal cavity and there was good hemostasis. The testicle was delivered back into the scrotum in its proper anatomical position.  Arista was used for extra hemostatic effect given the inflammation and increased risk of hematoma.  Also left 1/4 inch Penrose drain and secured that down with a 3-0 chromic.  The dartos was closed with a running 3-0 chromic. The skin was closed with interrupted 3-0 chromic sutures. 10 mL of 2% Marcaine without epinephrine was instilled for anesthetic affect. Dermabond was applied and then a dressing along with scrotal support was applied. This concluded the operation. The patient tolerated the procedure well and was stable postoperatively.  Plan: The patient will return in several weeks for postoperative wound check.

## 2020-03-12 NOTE — Transfer of Care (Signed)
Immediate Anesthesia Transfer of Care Note  Patient: Austin Carter  Procedure(s) Performed: LEFT HYDROCELECTOMY ADULT (Left Scrotum)  Patient Location: PACU  Anesthesia Type:General  Level of Consciousness: awake, alert  and oriented  Airway & Oxygen Therapy: Patient Spontanous Breathing and Patient connected to nasal cannula oxygen  Post-op Assessment: Report given to RN and Post -op Vital signs reviewed and stable  Post vital signs: Reviewed and stable  Last Vitals:  Vitals Value Taken Time  BP 121/60 03/12/20 1007  Temp    Pulse 72 03/12/20 1007  Resp 15 03/12/20 1007  SpO2 98 % 03/12/20 1007  Vitals shown include unvalidated device data.  Last Pain:  Vitals:   03/12/20 0810  TempSrc: Oral  PainSc: 2       Patients Stated Pain Goal: 2 (03/12/20 0810)  Complications: No complications documented.

## 2020-03-12 NOTE — Interval H&P Note (Signed)
History and Physical Interval Note:  03/12/2020 8:52 AM  Shirlean Mylar  has presented today for surgery, with the diagnosis of LEFT HYDROCELE.  The various methods of treatment have been discussed with the patient and family. After consideration of risks, benefits and other options for treatment, the patient has consented to  Procedure(s): LEFT HYDROCELECTOMY ADULT (Left) as a surgical intervention.  The patient's history has been reviewed, patient examined, no change in status, stable for surgery.  I have reviewed the patient's chart and labs.  Questions were answered to the patient's satisfaction.     Ray Church, III

## 2020-03-12 NOTE — H&P (Signed)
H&P  Chief Complaint: Left hydrocele  History of Present Illness: 74 year old male with a left-sided hydrocele status post relatively recent hydrocelectomy has had a recurrence.  He presents for repeat hydrocelectomy.  Past Medical History:  Diagnosis Date  . Blind   . Concussion 2016   no residual  . Hypertension   . Hypothyroidism   . Retinitis pigmentosa    blind   Past Surgical History:  Procedure Laterality Date  . EYE SURGERY  1979   cataracts both eyes  . HYDROCELE EXCISION Right 2007  . left hydrocelectomy  02/01/2020   at [facility] center  . NASAL SINUS SURGERY  03/14/14  . neuromal foot  1991   right  . torn cartilage right knee  1988    Home Medications:  Medications Prior to Admission  Medication Sig Dispense Refill Last Dose  . amLODipine (NORVASC) 10 MG tablet Take 10 mg by mouth daily.   03/12/2020 at 0600  . aspirin EC 81 MG tablet Take 81 mg by mouth daily. Swallow whole.   Past Week at Unknown time  . atorvastatin (LIPITOR) 20 MG tablet Take 20 mg by mouth daily.    03/12/2020 at 0600  . cetirizine (ZYRTEC) 10 MG tablet Take 10 mg by mouth daily as needed for allergies.   03/11/2020 at Unknown time  . diphenhydrAMINE (BENADRYL) 25 MG tablet Take 25 mg by mouth at bedtime as needed for allergies or sleep.   03/11/2020 at Unknown time  . famotidine (PEPCID) 20 MG tablet Take 20 mg by mouth 2 (two) times daily.    03/11/2020 at Unknown time  . HYDROcodone-acetaminophen (NORCO/VICODIN) 5-325 MG tablet Take 1 tablet by mouth every 6 (six) hours as needed for moderate pain.   Past Week at Unknown time  . levothyroxine (SYNTHROID, LEVOTHROID) 75 MCG tablet Take 75 mcg by mouth daily before breakfast.   03/12/2020 at 0600  . predniSONE (DELTASONE) 1 MG tablet Take 2 mg by mouth daily as needed (sinus).    03/11/2020 at Unknown time  . sulfamethoxazole-trimethoprim (BACTRIM DS) 800-160 MG tablet Take 1 tablet by mouth 2 (two) times daily.   03/11/2020 at Unknown  time   Allergies: No Known Allergies  Family History  Problem Relation Age of Onset  . Colon cancer Neg Hx   . Pancreatic cancer Neg Hx   . Stomach cancer Neg Hx   . Rectal cancer Neg Hx    Social History:  reports that he quit smoking about 49 years ago. His smoking use included cigarettes. He has a 6.00 pack-year smoking history. He has never used smokeless tobacco. He reports previous alcohol use of about 2.0 standard drinks of alcohol per week. He reports that he does not use drugs.  ROS: A complete review of systems was performed.  All systems are negative except for pertinent findings as noted. ROS   Physical Exam:  Vital signs in last 24 hours: Temp:  [97.9 F (36.6 C)] 97.9 F (36.6 C) (08/18 0810) Resp:  [14] 14 (08/18 0810) BP: (126)/(64) 126/64 (08/18 0810) SpO2:  [98 %] 98 % (08/18 0810) Weight:  [78.5 kg] 78.5 kg (08/18 0810) General:  Alert and oriented, No acute distress HEENT: Normocephalic, atraumatic Neck: No JVD or lymphadenopathy Cardiovascular: Regular rate and rhythm Lungs: Regular rate and effort Abdomen: Soft, nontender, nondistended, no abdominal masses Back: No CVA tenderness Extremities: No edema Neurologic: Grossly intact  Laboratory Data:  Results for orders placed or performed during the hospital encounter of 03/12/20 (  from the past 24 hour(s))  I-STAT, chem 8     Status: Abnormal   Collection Time: 03/12/20  8:37 AM  Result Value Ref Range   Sodium 142 135 - 145 mmol/L   Potassium 3.8 3.5 - 5.1 mmol/L   Chloride 100 98 - 111 mmol/L   BUN 24 (H) 8 - 23 mg/dL   Creatinine, Ser 5.32 (H) 0.61 - 1.24 mg/dL   Glucose, Bld 93 70 - 99 mg/dL   Calcium, Ion 9.92 4.26 - 1.40 mmol/L   TCO2 26 22 - 32 mmol/L   Hemoglobin 13.3 13.0 - 17.0 g/dL   HCT 83.4 39 - 52 %   Recent Results (from the past 240 hour(s))  Urine culture     Status: None   Collection Time: 03/05/20  5:14 PM   Specimen: Urine, Clean Catch  Result Value Ref Range Status    Specimen Description   Final    URINE, CLEAN CATCH Performed at [facility], [address] [place], [place] [address]    Special Requests   Final    NONE Performed at [facility], [address], [place], [place] [address]    Culture   Final    NO GROWTH Performed at [facility], [address] [place], [place] [address]    Report Status 03/07/2020 FINAL  Final  SARS CORONAVIRUS 2 (TAT 6-24 HRS) Nasopharyngeal Nasopharyngeal Swab     Status: None   Collection Time: 03/10/20  9:52 AM   Specimen: Nasopharyngeal Swab  Result Value Ref Range Status   SARS Coronavirus 2 NEGATIVE NEGATIVE Final    Comment: (NOTE) SARS-CoV-2 target nucleic acids are NOT DETECTED.  The SARS-CoV-2 RNA is generally detectable in upper and lower respiratory specimens during the acute phase of infection. Negative results do not preclude SARS-CoV-2 infection, do not rule out co-infections with other pathogens, and should not be used as the sole basis for treatment or other patient management decisions. Negative results must be combined with clinical observations, patient history, and epidemiological information. The expected result is Negative.  Fact Sheet for Patients: HairSlick.no  Fact Sheet for Healthcare Providers: quierodirigir.com  This test is not yet approved or cleared by the [place] FDA and  has been authorized for detection and/or diagnosis of SARS-CoV-2 by FDA under an Emergency Use Authorization (EUA). This EUA will remain  in effect (meaning this test can be used) for the duration of the COVID-19 declaration under Se ction 564(b)(1) of the Act, 21 U.S.C. section 360bbb-3(b)(1), unless the authorization is terminated or revoked sooner.  Performed at [facility], [address], [place], [place] [address]    Creatinine: Recent Labs    03/12/20 8144  CREATININE 1.40*     Impression/Assessment:  Left hydrocele  Plan:  Proceed with left hydrocelectomy.  Ray Church, III 03/12/2020, 8:51 AM

## 2020-03-12 NOTE — Discharge Instructions (Addendum)
Discharge instructions following scrotal surgery  Call your doctor for:  Fever is greater than 100.5  Severe nausea or vomiting  Increasing pain not controlled by pain medication  Increasing redness or drainage from incisions  The number for questions or concerns is [phone]  Activity level: No lifting greater than 20 pounds (about equal to milk) for the next 2 weeks or until cleared to do so at follow-up appointment.  Otherwise activity as tolerated by comfort level.  Diet: May resume your regular diet as tolerated  Driving: No driving while still taking opiate pain medications (weight at least 6-8 hours after last dose).  No driving if you still sore from surgery as it may limit her ability to react quickly if necessary.   Shower/bath: May shower and get incision wet pad dry immediately following.  Do not scrub vigorously for the next 2-3 weeks.  Do not soak incision (ID soaking in bath or swimming) until told he may do so by Dr., as this may promote a wound infection.  Wound care: He may cover wounds with sterile gauze as needed to prevent incisions rubbing on close follow-up in any seepage.  Where tight fitting underpants/scrotal support for at least 2 weeks.  He should apply cold compresses (ice or sac of frozen peas/corn) to your scrotum for at least 48 hours to reduce the swelling for 15 minutes at a time indirectly.  You should expect that his scrotum will swell up initially and then get smaller over the next 2-4 weeks.  Follow-up appointments: Follow-up appointment will be scheduled with Dr. Alvester Morin for a wound check.   Post Anesthesia Home Care Instructions  Activity: Get plenty of rest for the remainder of the day. A responsible adult should stay with you for 24 hours following the procedure.  For the next 24 hours, DO NOT: -Drive a car -Advertising copywriter -Drink alcoholic beverages -Take any medication unless instructed by your physician -Make any legal  decisions or sign important papers.  Meals: Start with liquid foods such as gelatin or soup. Progress to regular foods as tolerated. Avoid greasy, spicy, heavy foods. If nausea and/or vomiting occur, drink only clear liquids until the nausea and/or vomiting subsides. Call your physician if vomiting continues.  Special Instructions/Symptoms: Your throat may feel dry or sore from the anesthesia or the breathing tube placed in your throat during surgery. If this causes discomfort, gargle with warm salt water. The discomfort should disappear within 24 hours.  If you had a scopolamine patch placed behind your ear for the management of post- operative nausea and/or vomiting:  1. The medication in the patch is effective for 72 hours, after which it should be removed.  Wrap patch in a tissue and discard in the trash. Wash hands thoroughly with soap and water. 2. You may remove the patch earlier than 72 hours if you experience unpleasant side effects which may include dry mouth, dizziness or visual disturbances. 3. Avoid touching the patch. Wash your hands with soap and water after contact with the patch.

## 2020-03-13 ENCOUNTER — Encounter (HOSPITAL_BASED_OUTPATIENT_CLINIC_OR_DEPARTMENT_OTHER): Payer: Self-pay | Admitting: Urology

## 2020-03-17 ENCOUNTER — Telehealth (INDEPENDENT_AMBULATORY_CARE_PROVIDER_SITE_OTHER): Payer: Medicare Other | Admitting: Physician Assistant

## 2020-03-17 DIAGNOSIS — Z9889 Other specified postprocedural states: Secondary | ICD-10-CM

## 2020-03-17 NOTE — Progress Notes (Signed)
[facility]  Post-Operative Patient Note    MRN: [id number]      HPI   Post-op laminectomy     HPI    Frederick [name] is a 74 y.o. 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 He did well post-operatively and was discharged home the same day. At today's visit he is doing great. His pre-op leg pain has resolved, and incisional pain is minimal. He is off all pain medications. He denies any new symptoms or concerns.     Due to the COVID-19 virus and guidelines, the patient's appointment was conducted over a video consultation (Zoom) for the safety of the patient, other patients, and office staff. Verbal consent has been obtained from Burgess Amor to conduct a video (Zoom) visit encounter to minimize exposure to COVID-19: yes    I spent 15 minutes with patient via zoom/telephone during this visit.    Impression   74 y/o male presents 11 days post-op from L2-L4 bilateral laminectomies for chronic back pain and neurogenic claudication. He is doing excellent post-operatively. He is ambulating daily without assistance and off all pain medication. Incision is healing well.     Plan   1.  Refer to PT  2. Incision care  2. Follow up with Dr. Rayann Heman 4-6 weeks post-op    Review of Systems   Review of Systems   Constitutional: Negative.    HENT: Negative.    Respiratory: Negative.    Cardiovascular: Negative.    Musculoskeletal: Negative.    Neurological: Negative.      All other systems reviewed and are negative.    Physical Examination   VITAL SIGNS:  There were no vitals filed for this visit.    On video exam:  Incision clean, dry, intact with steri-strips    Due to the patient's preference and  COVID 19 CDC recommendations, the vital signs and exam are deferred at this time. Problem List, Medications, and Allergies reviewed: yes    Current Medications       Current Outpatient  Medications:   .  amLODIPine (NORVASC) 5 MG tablet, TAKE 1 TABLET DAILY, Disp: 90 tablet, Rfl: 1  .  aspirin EC 81 MG EC tablet, Take 81 mg by mouth daily, Disp: , Rfl:   .  atorvastatin (Lipitor) 20 MG tablet, Take 1 tablet (20 mg total) by mouth nightly, Disp: 90 tablet, Rfl: 1  .  cyclobenzaprine (FLEXERIL) 5 MG tablet, Take 1 tablet (5 mg total) by mouth every 8 (eight) hours as needed for Muscle spasms, Disp: 30 tablet, Rfl: 1  .  ergocalciferol (ERGOCALCIFEROL) 1.25 MG (50000 UT) capsule, Take 1 capsule (50,000 Units total) by mouth once a week for 12 doses, Disp: 12 capsule, Rfl: 0  .  eszopiclone (LUNESTA) 2 MG tablet, TAKE 1 TABLET NIGHTLY      IMMEDIATELY BEFORE BEDTIME, Disp: 90 tablet, Rfl: 1  .  losartan (COZAAR) 100 MG tablet, Take 1 tablet (100 mg total) by mouth daily, Disp: 90 tablet, Rfl: 3  .  polyethylene glycol (MIRALAX) 17 g packet, Take 17 g by mouth daily, Disp: 14 packet, Rfl: 0     Radiology Interpretation   Fluoroscopy less than 1 hour    Result Date: 03/06/2020   Intraoperative imaging as above. Leandro Reasoner, MD  03/06/2020 10:57 AM     Alessandra Bevels, PA-C

## 2020-03-26 ENCOUNTER — Other Ambulatory Visit: Payer: Self-pay | Admitting: Internal Medicine

## 2020-03-26 DIAGNOSIS — Z23 Encounter for immunization: Secondary | ICD-10-CM

## 2020-04-02 ENCOUNTER — Encounter (INDEPENDENT_AMBULATORY_CARE_PROVIDER_SITE_OTHER): Payer: Self-pay

## 2020-04-15 ENCOUNTER — Ambulatory Visit (INDEPENDENT_AMBULATORY_CARE_PROVIDER_SITE_OTHER): Payer: Medicare Other | Admitting: Neurological Surgery

## 2020-04-15 ENCOUNTER — Encounter (INDEPENDENT_AMBULATORY_CARE_PROVIDER_SITE_OTHER): Payer: Self-pay | Admitting: Neurological Surgery

## 2020-04-15 VITALS — BP 171/87 | HR 71 | Ht 74.0 in | Wt 190.0 lb

## 2020-04-15 DIAGNOSIS — Z9889 Other specified postprocedural states: Secondary | ICD-10-CM

## 2020-04-15 NOTE — Progress Notes (Signed)
[facility]  Post-Operative Patient Note    MRN: [id number]      HPI   Post-op laminectomy     HPI    Frederick [name] is a 74 y.o. 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 He did well post-operatively and was discharged home the same day. At today's visit he is doing great. His pre-op leg pain has resolved, and incisional pain is minimal. He is off all pain medications. He denies any new symptoms or concerns.       Impression   Mr. Bhat and his wife present for follow-up.  He is 6 weeks status post L2-4 decompressive laminectomy.  He is very happy.  All of his preoperative pain has resolved.  I am very happy at his early postoperative course.  I encouraged increase level of activity.  His incision is healing nicely.  Plan   1. Continue PT, progressive activity with no restrictions  2. Follow up as needed      Again, many thanks for allowing me to participate in the care of this patient.  I will keep you apprised of their progress.  Please call me with any questions.    Sincerely,    Delena Bali MD    Neurosurgery  [phone]  (cell)  [phone]  (PA)   Alessandra Bevels, PA   ([email or web address])        Review of Systems   Review of Systems   Constitutional: Negative.    HENT: Negative.    Respiratory: Negative.    Cardiovascular: Negative.    Musculoskeletal: Negative.    Neurological: Negative.      All other systems reviewed and are negative.    Physical Examination   VITAL SIGNS:    Vitals:    04/15/20 0950   BP: 171/87   Pulse: 71     Awake, alert, oriented x3, Follows commands  GCS: 15   Speech is clear  Attention span and concentration: intact  Recent and remote memory: intact    Incision clean, dry, intact    Gait Intact  Able to walk on toes and heels    Point tenderness: None     Lower extremity strength:     HF KE KF DF PF EHL   Right 5 5 5  5 5 5    Left  5 5 5 5 5 5       Sensation to light touch intact throughout lower extremities    Current Medications       Current Outpatient Medications:   .  amLODIPine (NORVASC) 5 MG tablet, TAKE 1 TABLET DAILY, Disp: 90 tablet, Rfl: 1  .  aspirin EC 81 MG EC tablet, Take 81 mg by mouth daily, Disp: , Rfl:   .  atorvastatin (Lipitor) 20 MG tablet, Take 1 tablet (20 mg total) by mouth nightly, Disp: 90 tablet, Rfl: 1  .  ergocalciferol (ERGOCALCIFEROL) 1.25 MG (50000 UT) capsule, Take 1 capsule (50,000 Units total) by mouth once a week for 12 doses, Disp: 12 capsule, Rfl: 0  .  eszopiclone (LUNESTA) 2 MG tablet, TAKE 1 TABLET NIGHTLY      IMMEDIATELY BEFORE BEDTIME, Disp: 90 tablet, Rfl: 1  .  losartan (COZAAR) 100 MG tablet, Take 1 tablet (100 mg total) by mouth daily, Disp: 90 tablet, Rfl: 3     Radiology Interpretation  No results found.     Jerilynn Mages, MD    All relevant and clinical information was transcribed by me, Gus Rankin, acting as a scribe for Dr Rayann Heman

## 2020-05-05 NOTE — Addendum Note (Signed)
Addended by: Mancel Bale on: 05/05/2020 02:15 PM     Modules accepted: Orders

## 2020-05-06 ENCOUNTER — Ambulatory Visit (INDEPENDENT_AMBULATORY_CARE_PROVIDER_SITE_OTHER): Payer: Medicare Other

## 2020-05-06 DIAGNOSIS — Z23 Encounter for immunization: Secondary | ICD-10-CM

## 2020-05-06 NOTE — Progress Notes (Signed)
HD flu vaccination was administered IM in left deltoid. Detailed information and VIS was given to the patient. Patient left in good condition.

## 2020-05-23 ENCOUNTER — Telehealth: Payer: Self-pay | Admitting: Internal Medicine

## 2020-05-23 NOTE — Telephone Encounter (Signed)
Plase schedule for booster, also wife Zayveon Raschke    [name], MD  8:57 AM 05/23/2020    [facility]  [address]  [place], [place] [address]  P) [phone]  F) [phone]  [email or web address]

## 2020-05-23 NOTE — Telephone Encounter (Signed)
Scheduled for 11-9

## 2020-05-23 NOTE — Telephone Encounter (Signed)
Patient's wife called to say that they are interested in receiving the Covid booster.  Please call to schedule.

## 2020-06-03 ENCOUNTER — Ambulatory Visit (INDEPENDENT_AMBULATORY_CARE_PROVIDER_SITE_OTHER): Payer: Medicare Other

## 2020-06-03 DIAGNOSIS — Z23 Encounter for immunization: Secondary | ICD-10-CM

## 2020-06-05 ENCOUNTER — Telehealth: Payer: Self-pay | Admitting: Internal Medicine

## 2020-06-05 NOTE — Telephone Encounter (Signed)
Patient's wife called to say they had been notified that his Vitamin D 50,000 MG prescription had been renewed--does he really need to keep taking this? They thought this was a temporary situation. Please advise.

## 2020-06-06 ENCOUNTER — Other Ambulatory Visit: Payer: Self-pay

## 2020-06-06 NOTE — Telephone Encounter (Signed)
Discussed with Dr. Sherilyn Cooter then called patient. Spoke with his wife and informed her that Frederick [name] no longer needs to take the prescription dose of Vit D and that it is fine for him to take 1,000 units OTC daily. We will d/c the prescription dose. She verbalized understanding.

## 2020-06-13 ENCOUNTER — Other Ambulatory Visit: Payer: Self-pay

## 2020-06-13 ENCOUNTER — Ambulatory Visit
Admission: RE | Admit: 2020-06-13 | Discharge: 2020-06-13 | Disposition: A | Discharge status: Home / Self Care | Payer: Medicare HMO | Source: Ambulatory Visit | Attending: Internal Medicine | Admitting: Internal Medicine

## 2020-06-13 DIAGNOSIS — Z7952 Long term (current) use of systemic steroids: Secondary | ICD-10-CM

## 2020-07-07 ENCOUNTER — Telehealth: Payer: Self-pay | Admitting: Internal Medicine

## 2020-07-07 NOTE — Telephone Encounter (Signed)
Patient's wife called and want medication (ATORVASTATIN 20MG ) refilled for his husband at the  [facility]

## 2020-07-09 ENCOUNTER — Other Ambulatory Visit: Payer: Self-pay

## 2020-07-09 DIAGNOSIS — I159 Secondary hypertension, unspecified: Secondary | ICD-10-CM

## 2020-07-09 DIAGNOSIS — E782 Mixed hyperlipidemia: Secondary | ICD-10-CM

## 2020-07-09 MED ORDER — AMLODIPINE BESYLATE 5 MG PO TABS
5.0000 mg | ORAL_TABLET | Freq: Every day | ORAL | 1 refills | Status: DC
Start: 2020-07-09 — End: 2021-01-19

## 2020-07-09 MED ORDER — ATORVASTATIN CALCIUM 20 MG PO TABS
20.0000 mg | ORAL_TABLET | Freq: Every evening | ORAL | 1 refills | Status: DC
Start: 2020-07-09 — End: 2021-01-19

## 2020-07-09 NOTE — Telephone Encounter (Signed)
Opened in error

## 2020-07-26 DIAGNOSIS — C439 Malignant melanoma of skin, unspecified: Secondary | ICD-10-CM

## 2020-07-26 HISTORY — PX: EXCISION, MELANOMA: SHX3990

## 2020-07-26 HISTORY — DX: Malignant melanoma of skin, unspecified: C43.9

## 2020-11-17 ENCOUNTER — Other Ambulatory Visit: Payer: Self-pay | Admitting: Internal Medicine

## 2020-11-17 DIAGNOSIS — I159 Secondary hypertension, unspecified: Secondary | ICD-10-CM

## 2020-11-17 DIAGNOSIS — G479 Sleep disorder, unspecified: Secondary | ICD-10-CM

## 2020-11-17 MED ORDER — ESZOPICLONE 2 MG PO TABS
ORAL_TABLET | ORAL | 1 refills | Status: DC
Start: 2020-11-17 — End: 2021-05-18

## 2020-11-17 MED ORDER — LOSARTAN POTASSIUM 100 MG PO TABS
100.0000 mg | ORAL_TABLET | Freq: Every day | ORAL | 3 refills | Status: DC
Start: 2020-11-17 — End: 2022-01-06

## 2020-11-17 NOTE — Telephone Encounter (Signed)
Pt's wife requests Rx's for Losartan 100 mg and Eszopiclone 2 mg be sent to [facility].

## 2020-12-02 ENCOUNTER — Other Ambulatory Visit: Payer: Self-pay

## 2020-12-02 DIAGNOSIS — Z23 Encounter for immunization: Secondary | ICD-10-CM

## 2020-12-15 ENCOUNTER — Ambulatory Visit (FREE_STANDING_LABORATORY_FACILITY): Payer: Medicare Other

## 2020-12-15 ENCOUNTER — Encounter: Payer: Self-pay | Admitting: Internal Medicine

## 2020-12-15 ENCOUNTER — Ambulatory Visit (INDEPENDENT_AMBULATORY_CARE_PROVIDER_SITE_OTHER): Payer: Medicare Other | Admitting: Internal Medicine

## 2020-12-15 VITALS — BP 162/92 | HR 68 | Resp 15 | Ht 74.0 in | Wt 192.9 lb

## 2020-12-15 DIAGNOSIS — R739 Hyperglycemia, unspecified: Secondary | ICD-10-CM

## 2020-12-15 DIAGNOSIS — N401 Enlarged prostate with lower urinary tract symptoms: Secondary | ICD-10-CM

## 2020-12-15 DIAGNOSIS — R351 Nocturia: Secondary | ICD-10-CM

## 2020-12-15 DIAGNOSIS — Z9889 Other specified postprocedural states: Secondary | ICD-10-CM

## 2020-12-15 DIAGNOSIS — I10 Essential (primary) hypertension: Secondary | ICD-10-CM

## 2020-12-15 DIAGNOSIS — Z0000 Encounter for general adult medical examination without abnormal findings: Secondary | ICD-10-CM

## 2020-12-15 DIAGNOSIS — M720 Palmar fascial fibromatosis [Dupuytren]: Secondary | ICD-10-CM

## 2020-12-15 DIAGNOSIS — E782 Mixed hyperlipidemia: Secondary | ICD-10-CM

## 2020-12-15 DIAGNOSIS — E559 Vitamin D deficiency, unspecified: Secondary | ICD-10-CM

## 2020-12-15 LAB — COMPREHENSIVE METABOLIC PANEL
ALT: 29 U/L (ref 0–55)
AST (SGOT): 28 U/L (ref 5–34)
Albumin/Globulin Ratio: 1.3 (ref 0.9–2.2)
Albumin: 3.9 g/dL (ref 3.5–5.0)
Alkaline Phosphatase: 49 U/L (ref 37–117)
Anion Gap: 9 (ref 5.0–15.0)
BUN: 13 mg/dL (ref 9.0–28.0)
Bilirubin, Total: 1.8 mg/dL — ABNORMAL HIGH (ref 0.2–1.2)
CO2: 25 mEq/L (ref 21–29)
Calcium: 8.8 mg/dL (ref 7.9–10.2)
Chloride: 105 mEq/L (ref 100–111)
Creatinine: 0.8 mg/dL (ref 0.5–1.5)
Globulin: 3 g/dL (ref 2.0–3.6)
Glucose: 115 mg/dL — ABNORMAL HIGH (ref 70–100)
Potassium: 4.4 mEq/L (ref 3.5–5.1)
Protein, Total: 6.9 g/dL (ref 6.0–8.3)
Sodium: 139 mEq/L (ref 136–145)

## 2020-12-15 LAB — CBC AND DIFFERENTIAL
Absolute NRBC: 0 10*3/uL (ref 0.00–0.00)
Basophils Absolute Automated: 0.01 10*3/uL (ref 0.00–0.08)
Basophils Automated: 0.2 %
Eosinophils Absolute Automated: 0.08 10*3/uL (ref 0.00–0.44)
Eosinophils Automated: 1.7 %
Hematocrit: 46.5 % (ref 37.6–49.6)
Hgb: 14.9 g/dL (ref 12.5–17.1)
Immature Granulocytes Absolute: 0.01 10*3/uL (ref 0.00–0.07)
Immature Granulocytes: 0.2 %
Lymphocytes Absolute Automated: 0.97 10*3/uL (ref 0.42–3.22)
Lymphocytes Automated: 20.5 %
MCH: 31.5 pg (ref 25.1–33.5)
MCHC: 32 g/dL (ref 31.5–35.8)
MCV: 98.3 fL — ABNORMAL HIGH (ref 78.0–96.0)
MPV: 10 fL (ref 8.9–12.5)
Monocytes Absolute Automated: 0.5 10*3/uL (ref 0.21–0.85)
Monocytes: 10.6 %
Neutrophils Absolute: 3.16 10*3/uL (ref 1.10–6.33)
Neutrophils: 66.8 %
Nucleated RBC: 0 /100 WBC (ref 0.0–0.0)
Platelets: 245 10*3/uL (ref 142–346)
RBC: 4.73 10*6/uL (ref 4.20–5.90)
RDW: 12 % (ref 11–15)
WBC: 4.73 10*3/uL (ref 3.10–9.50)

## 2020-12-15 LAB — URINALYSIS
Bilirubin, UA: NEGATIVE
Blood, UA: NEGATIVE
Glucose, UA: NEGATIVE
Ketones UA: NEGATIVE
Leukocyte Esterase, UA: NEGATIVE
Nitrite, UA: NEGATIVE
Protein, UR: 30 — AB
Specific Gravity UA: 1.021 (ref 1.001–1.035)
Urine pH: 6 (ref 5.0–8.0)
Urobilinogen, UA: 0.2 (ref 0.2–2.0)

## 2020-12-15 LAB — URINE MICROSCOPIC: Urine Bacteria: NONE SEEN /hpf

## 2020-12-15 LAB — HEMOGLOBIN A1C
Average Estimated Glucose: 105.4 mg/dL
Hemoglobin A1C: 5.3 % (ref 4.6–5.9)

## 2020-12-15 LAB — TSH: TSH: 2.2 u[IU]/mL (ref 0.35–4.94)

## 2020-12-15 LAB — GFR: EGFR: >60

## 2020-12-15 LAB — LIPID PANEL
Cholesterol / HDL Ratio: 3
Cholesterol: 155 mg/dL (ref 0–199)
HDL: 51 mg/dL (ref 40–9999)
LDL Calculated: 81 mg/dL (ref 0–99)
Triglycerides: 114 mg/dL (ref 34–149)
VLDL Calculated: 23 mg/dL (ref 10–40)

## 2020-12-15 LAB — HEMOLYSIS INDEX: Hemolysis Index: 4 (ref 0–24)

## 2020-12-15 LAB — VITAMIN D,25 OH,TOTAL: Vitamin D, 25 OH, Total: 41 ng/mL (ref 30–100)

## 2020-12-15 LAB — PSA: Prostate Specific Antigen, Total: 1.354 ng/mL (ref 0.000–4.000)

## 2020-12-15 NOTE — Progress Notes (Signed)
Dear Frederick [name],    It was a pleasure meeting with you earlier today. I would like to discuss your recent lab work:      - PSA 1.354 normal range ( 0 - 4.0)  - Urinalysis - normal no evidence of infection   - CBC and differential - normal no evidence of infection or inflammation  - Electrolytes and kidney function are normal  - Liver functions are normal  - Fasting blood sugar is mildly elevated 115 normal < 100 I will ask them to add an HbA1c another measure for Diabetes. At this time, I would recommend to manage with diet increase soluable fibers like greens and stalk vegetables and decrease grains like pasta, rice and bread   - TSH - normal (thyroid)  - Vitamin D,25 OH, Total - 40 excellent - normal 30 or greater    - Total cholesterol is 155 (goal < 200) triglycerides are 114 (goal < 150), HDL or good cholesterol is 51 (goal >40) LDL or bad    cholesterol is 81 (numeric goal depends on overall cardiac risk estimation)    The 10-year ASCVD risk score (Goff [place] Jr., et al., 2013) is: 37.1% this is high because of age   LDL < 100 is optimal continue present management no changes to medications at this time      Values used to calculate the score:37.1%      Age: 75 years      Sex: Male      Is Non-Hispanic African American: No      Diabetic: No      Tobacco smoker: No      Systolic Blood Pressure: 162 mmHg      Is BP treated: Yes      HDL Cholesterol: 51 mg/dL      Total Cholesterol: 155 mg/dL     Discussed    Athero Sclerotic Cardio Vascular Disease risk score ( ASCVD) calculated and your ASCVD = 37.1%  ASCVD was >7.5% since already on medications at optimal dose LDL < 100    Recommend diet management avoid fats, meats and carbs and increase fish, fiber and fruit and plant based diet, and exercise CV 30 minutes daily  Increase plant based protein for example broccoli, cauliflower, soy, tofu, Quinoa, lentils    Also implement Flax/Fish oil tabs to be supportive  2 alternative options to prescription medications are the  following:    1.Flax seeds ground 2 tbsp daily which can reduce LDL and increase HDL by 25 %    OR    2.DHA/EPA Omega FA Fish oil tabs 1200-3600 mg tabs ( 1 tab =600 mg , so that is 1-2 tabs with each meal) also supposed to increase HDL    recommend to repeat Lipids fasting in 3 months    Normal healthy guidelines    Regular exercise 45-60 min 4-6 times a week  Limit white foods that are high in carbohydrates: bread, rice, potatoes, pasta, sugar, and flour.  Low fat, low cholesterol diet  Colonoscopy to screen for colon cancer by age 82, and as recommended thereafter based on your results  See the dentist every 6-12 months  Safety guidelines wear seatbelts and sunscreen and sunglasses and hat to protect from sun exposure  Wear sunscreen for exposures over 15 min.- recommend 30-70 proof  Wear seatbelts whether driving or a passenger.  Do not drink alcohol and drive, or ride in a vehicle with another driver who has been drinking.  Maintain a healthy  weight. Your BMI should be 25 or less  BMI > 25 means you are clinically overweight  BMI > 30 means you are clinically obese    Men:  PSA anually > 13 yo  Advanced directive recommend to file with PMD and can supply for [place] to keep on chart    Please let me know if you have any concerns or questions.    [name], MD  3:23 PM 12/15/2020    [facility]  [address]  [place], [place] [address]  P) [phone]  F) [phone]  [email or web address]

## 2020-12-15 NOTE — Progress Notes (Signed)
Sent ADD ON request to [facility] to add Hemoglobin A1C per Dr. Sherilyn Cooter

## 2020-12-15 NOTE — Progress Notes (Signed)
Fitness Evaluation:    Body Fat as percentage: In men, over 25% is obese, 20-25% is higher than normal, 16-20% is healthy / normal, <16% or under is considered lean / ideal.   2022= 18.3%     Visceral Fat: Abdominal "belly" fat.Visceral fat is a type of body fat that exists in the abdomen and surrounds the internal organs. Everyone has some, especially those who are sedentary, chronically stressed, or maintain unhealthy diets. A different type of fat -- subcutaneous fat -- which builds up under the skin, has less of a negative impact on health and is easier to lose than visceral fat. A high level of visceral fat can increase your risk for serious health problems including cardiovascular disease, types 2 diabetes, and increased blood pressure.     2022= 7 (normal <10).     Current Exercise Program:   [employer] results reviewed in detail and compared to prior results.    Pulmonary Function Test:  Test not done do to current COVID precuations.    Immunizations:  Immunization record is up to date. The [employer] ([facility]) was checked for any missing immunization record.    Health Maintenance:   Colonoscopy: due 2023   Depression Screening: done   Advanced Directive: on file

## 2020-12-15 NOTE — Progress Notes (Signed)
Testing:  Labs were drawn from left [facility] without difficulty. Aseptic technique utilized. No complications noted. Attempts x 1. Dressing applied. Patient tolerated well. Urine collected at this visit. Patient instructed on clean-catch technique per protocol. Patient verbalized understanding.. Labs sent to [facility] ambient and [facility] refrigerated.

## 2020-12-15 NOTE — Progress Notes (Signed)
Chief Complaint   Patient presents with   . Medicare Annual Wellness Visit - Subsequent   . Hypertension     Home BP: 110-120/60-70s       History of Present Illness    Frederick [name] is a 75 y.o. male  who is here for an [facility] annual physical examination.    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  [employer] to the [employer] [address]    Weight maintained < 200 lbs  He has labile BP and manages with daily Losartan 100 mg, Amlodipine 5 mg  Tinnitus he has hearing aids that are allowing for the tinnitus to be dulled out  1993 started with a traumatic event at a concert and tinnitus started in  2008 he was at a party  And again increased the tonality loss and then in 2017 - stepped on a deck and a skill saw was a sharp frequency  Lumbar disc fusion Dr Philippa Chester this past year  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 Veggie sausage  Lunch - dinner, sandwhiches  Snack sugar snaps with hummus  Dinner - homecooked meal,  [address] - salmon, shrimp filet 2 x a month   Soda - none  Juice - none  Coffee - de-cafe coffee  Water - seltzer  Drink a little less wine    EXERCISE: 4 x a week every other day  Treadmill 30- 45 minutes  Total Gym 20 mins    SLEEP: 11 - 4 to urinate , 6-7 am gets up, every night with Lunesta 2 mg QHS with in 10-15 mins    STRESS: self care with attitude and exercise    CANCER:No family history of uterine, cervical, ovary, colon, prostate  PGM and PAunt - radical mastectomy      HEALTH MAINTENANCE:  Immunizations-   Covid 4 shots total - Moderna - muscle soreness  Tdap   Flu vaccine   ShingRx-  will f/u at pharmacy/wait list    COLON:4 years will repeat next year   JWJ:XBJYN   SKIN:dermatologist   Opthalmologist - slow growing cataract, specialist who is watching the retina  Dental : UPTD had some dental work in place              [facility]            Frederick [name] is  a 75 y.o. male who presents today for the following Medicare Wellness Visit:  []  Initial Preventive Physical Exam (IPPE) - "Welcome to Medicare" preventive visit (Vision Screening required)   []  Annual Wellness Visit - Initial  [x]  Annual Wellness Visit - Subsequent     Health Risk Assessment:   During the past month, how would you rate your general health?:  Excellent  Which of the following tasks can you do without assistance - drive or take the bus alone; shop for groceries or clothes; prepare your own meals; do your own housework/laundry; handle your own finances/pay bills; eat, bathe or get around your home?: Do your own housework/laundry, Prepare your own meals, Eat, bathe, dress or get around your home, Shop for groceries or clothes, Handle your own finances/pay bills, Drive or take the bus alone  Which of the following problems have you been bothered by in the past month - dizzy when standing up; problems using the phone; feeling tired or fatigued; moderate or severe body pain?: None of these  Do you exercise  for about 20 minutes 3 or more days per week?:Yes  During the past month was someone available to help if you needed and wanted help?  For example, if you felt nervous, lonely, got sick and had to stay in bed, needed someone to talk to, needed help with daily chores or needed help just taking care of yourself.: Yes  Do you always wear a seat belt?: Yes  Do you have any trouble taking medications the way you have been told to take them?: No  Have you been given any information that can help you with keeping track of your medications?: No  Do you have trouble paying for your medications?: No  Have you been given any information that can help you with hazards in your house, such as scatter rugs, furniture, etc?: No  Do you feel unsteady when standing or walking?: No  Do you worry about falling?: No  Have you fallen two or more times in the past year?: No  Did you suffer any injuries from your falls in the past year?: No     Care Team:   Patient Care Team:  Lynelle Smoke, MD as PCP - General (Internal Medicine)  Jerilynn Mages, MD as Consulting Physician (Neurological Surgery)  Pess, Janet Berlin, MD as Consulting Physician (Hand Surgery)  Clinton Sawyer, Gelene Mink, MD as Consulting Physician (Cardiology)  Rulon Sera, OOD as Consulting Physician (Optometry)      Hospitalizations:   Hospitalization within past year: [x]  No  []  Yes     Diagnosis:      Screenings:   Ambulatory Screenings 08/18/2018 12/06/2019 12/15/2020   Falls Risk: De [place]ings[place] more than 2 times in past year N N N   Falls Risk: Suffer any injuries? N N N   Depression: PHQ2 Total Score - 0 0   Depression: PHQ9 Total Score - 0 0        Substance Use Disorder Screen:  In the past year, how often have you used the following?  1) Alcohol (For men, 5 or more drinks a day. For women, 4 or more drinks a day)  [x]  Never []  Once or Twice []  Monthly []  Weekly []  Daily or Almost Daily  2) Tobacco Products  [x]  Never []  Once or Twice []  Monthly []  Weekly []  Daily or  Almost Daily  3) Prescription Drugs for Non-Medical Reasons  [x]  Never []  Once or Twice []  Monthly []  Weekly []  Daily  or Almost Daily  4) Illegal Drugs  [x]  Never []  Once or Twice []  Monthly []  Weekly []  Daily or Almost Daily       Functional Ability/Level of Safety:   Falls Risk/Home Safety Assessment:  ( see HRA and Screenings sections for additional assessment)  Home Safety: [x]  Stair handrails  [x]  Skid-resistant rugs/remove throw rugs   [x]  Grab bars  [x]  Clear pathways between rooms  [x]  Proper lighting stairs/ bathrooms/bedrooms  Get Up and Go (optional):  [x]   <20 secs  []   >20 secs    []   High risk for falls - Home Safety/Falls Risk Precautions reviewed with pt/family    Hearing Assessment:  Concerns for hearing loss: [x]  Yes  []   No  Hearing aids:   []   Right  []   Left  []   Bilateral   [x]   None  Whisper Test (optional):  []  Normal  [x]   Slightly decreased  []   Significantly decreased    Exercise:  Frequency:  []   No formal exercise  []   1-2x/wk  []   3-4x/wk  [x]   >4x/wk  Duration:  []   15-30 mins/day  []   30-45 mins/day  [x]   45+ mins/day  Intensity:  []   Light  [x]   Moderate  []   Heavy        Activities of Daily Living:   ADL's Independent Minimal  Assistance Moderate  Assistance Total   Assistance   Bathing [x]  []  []  []    Dressing [x]  []  []  []    Mobility   [x]  []  []  []    Transfer [x]  []  []  []    Eating [x]  []  []  []    Toileting [x]  []  []  []      IADL's Independent Minimal  Assistance Moderate  Assistance Total   Assistance   Phone [x]  []  []  []    Housekeeping [x]  []  []  []    Laundry [x]  []  []  []    Transportation [x]  []  []  []    Medications [x]  []  []  []    Finances [x]  []  []  []       ADL assistance: [x]  No assistance needed  []  Spouse  []  Sibling  []  Son   []  Daughter []  Children  []  Home Health Aide []  Other:       Advance Care Planning:   Discussion of Advance Directives:   []  Advance Directive in chart  [x]  Advance Directive not in chart - requested to provide []  No Advance Directive.  Form Provided  []  No  Advance Directive.  Pt declines. []  Not addressed today  []  Other:             Evaluation of Cognitive Function:   Mood/affect: [x]  Appropriate  []   Other:   Appearance: [x]  Neatly groomed  [x]  Adequately nourished  []  Other:  Family member/caregiver input: []  Present - no concerns  []   Not present in room  []  Present - concerns:    Cognitive Assessment:  Mini-Cog Result (three word registration- banana, sunrise, chair / clock drawing):   [x]   > 3 points - negative screen for dementia   []  3 recalled words - negative screen for dementia   []  1-2 recalled words and normal clock draw - negative for cognitive impairment   []  1-2 recalled words and abnormal clock draw - positive for cognitive impairment   []  0 recalled words - positive for cognitive impairment         Assessment/Plan:   There are no diagnoses linked to this encounter.  Lynelle Smoke, MD    12/15/2020     The following sections were reviewed this encounter by the provider:           History:   Patient Active Problem List   Diagnosis   . Hyperlipidemia   . Insomnia   . Tinnitus   . Labile blood pressure   . Dupuytren's contracture of both hands   . Hyperacusis of both ears   . Dupuytren's contracture of left hand   . Preop examination   . ERRONEOUS ENCOUNTER--DISREGARD   . Lumbar spondylosis   . Coronary artery disease involving native coronary artery of native heart without angina pectoris   . Essential hypertension   . DDD (degenerative disc disease), lumbar   . Spinal stenosis of lumbar region without neurogenic claudication   . S/P lumbar laminectomy      Past Medical History:   Diagnosis Date   . Diverticulitis    . Elevated coronary artery calcium score 01/2020    H/O ABNORMAL CORONARY CALCIUM SCORE. 01/22/20 CARD NOTE FROM DR. BONTEMPO IN EPIC. 02/15/20 ECHO RESULTS PENDING IN EPIC.    Marland Kitchen Hearing loss     wears hearing aids, about 20% bilaterally    . Heart murmur 01/22/2020    basal systolic murmur - ASYMPTOMATIC.   Marland Kitchen Hyperacusis of both  ears     participating in audiotherapy    . Hyperlipidemia     MANAGED WITH MEDS.   Marland Kitchen Hypertension      MANAGED WITH MEDS. H/O WHITE COAT SYNDROME.    . 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 Low back pain     HX   . Tinnitus 1993     Past Surgical History:   Procedure Laterality Date   . APPENDECTOMY (OPEN)  1973   . COLONOSCOPY  02/2011   . COLONOSCOPY N/A 08/24/2016    Procedure: COLONOSCOPY;  Surgeon: Lestine Mount, MD;  Location: [id number];  Service: Gastroenterology;  Laterality: N/A;  COLONOSCOPY   . EXCISION, CYST  12/2006    back   . KNEE ARTHROSCOPY Left 09/2011   . LAMINECTOMY, POSTERIOR LUMBAR, DECOMPRESSION, LEVEL 2 Bilateral 03/06/2020    Procedure: BILATERAL L2-L4 LAMINECTOMY;  Surgeon: Jerilynn Mages, MD;  Location: [facility];  Service: Neurosurgery;  Laterality: Bilateral;  BILATERAL L2-L4 LAMINECTOMIES   . 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 RELEASE, 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   . ROTATOR CUFF REPAIR Left 09/2000    and shaving of bone spur     No Known Allergies   Outpatient Medications Marked as Taking for the 12/15/20 encounter (Office Visit) with Lynelle Smoke, MD   Medication Sig Dispense Refill   . amLODIPine (NORVASC) 5 MG tablet Take 1 tablet (5 mg total) by mouth daily 90 tablet 1   . atorvastatin (Lipitor) 20 MG tablet Take 1 tablet (20 mg total) by mouth nightly 90 tablet 1   . Cholecalciferol (Vitamin D3) 50 MCG (2000 UT) Tab Take by mouth     . eszopiclone (LUNESTA) 2 MG tablet TAKE 1 TABLET NIGHTLY      IMMEDIATELY BEFORE BEDTIME 90 tablet 1   . losartan (COZAAR) 100 MG tablet Take 1 tablet (100 mg total) by mouth daily 90 tablet 3     Social History     Tobacco Use   . Smoking status: Never Smoker   . Smokeless tobacco: Never Used   . Tobacco comment: briefly tried pipes, cigars &  cigarettes 1964-65; nothing since.   Vaping Use   . Vaping Use: Never used   Substance Use Topics   . Alcohol use: Yes     Alcohol/week: 7.0 standard drinks     Types:  7 Glasses of wine per week     Comment: small glasses and only with lunch or dinner   . Drug use: No      Family History   Problem Relation Age of Onset   . Alzheimer's disease Mother    . Heart attack Father    . Stroke Paternal Grandfather            ===================================================================    Additional Documentation:                Review of Systems  CONST: No weight change, no fevers/chills or sweats, fatigue, muscle aches  EYES: No blurry vision, double vision, loss of peripheral vision  EARS: No hearing loss, tinnitus, pain or discharge.   NOSE: No congestion, runny nose or bloody nose  MOUTH: No sore throat, oral lesions, difficulty swallowing, no change in character of voice  NECK: No swollen glands, stiffness or pain  CV: No chest pain, palpitations, leg swelling  RESP: No shortness of breath, cough, wheeze, asthma  GI: No n/v, diarrhea, constipation, abd pain, heartburn, no change in caliber/color of stool  MALE GU: No dysuria, frequency, hematuria, nocturia, testicular changes, penile discharge, erectile dysfunction  MSK:  No joint or muscle pain, swelling or weakness  SKIN:   No rashes lumps, sores or concerning moles  ENDO:  No heat/cold intolerance, polyuria, polydipsia, polyphagia  HEME:  No bruising/bleeding, swollen glands  NEURO:  No headache, lightneadedness, dizziness, loss of consciousness, numbness/tingling, weakness  PSYCH:  No depressed mood, anhedonia, anxiety    Physical Exam:  BP (!) 162/92 (BP Site: Left arm, Patient Position: Sitting, Cuff Size: Medium)   Pulse 68   Resp 15   Ht 1.88 m (6\' 2" )   Wt 87.5 kg (192 lb 14.4 oz)   SpO2 99%   BMI 24.77 kg/m    Wt Readings from Last 3 Encounters:   12/15/20 87.5 kg (192 lb 14.4 oz)   04/15/20 86.2 kg (190 lb)   03/06/20 87.5 kg (193 lb)     GEN: well developed well nourished male alert and appropriate in no apparent distress  HENT: tympanic membrane normal bilaterally, no nasal congestion, oropharynx normal w  no exudate or erythema  EYES: PERRL, EOMI, no pallor or scleral icterus, normal conjunctiva  NECK: supple, no thyromegaly or nodules appreciated  LYMPH: no cervical or supraclavicular lymphadenopathy appreciated  CV: RRR, no m/r/g.  No LE edema.  2+ radial pulses present and equal.  RESP: CTAB, normal effort, no rhonchi or rales  GI: soft, nontender/ nondistended, NABS, no rebound or guarding, no evident hepatosplenomegaly  GU: normal external genitalia no masses or hernia evident or palpable  SKIN: warm, dry.  no visible rashes or concerning moles  MSK: Normal bulk and tone, normal strength 5/5 and sensation UE / LE,scar on back well-healed, Dupuytrens contracture b/l hands affecting 2-4 digitis  NEURO: PERRLA, EOMI, face symmetric, hearing intact to voice, palate raise, shoulder shrug and neck flexion intact, tongue midline. Moves all extremities.  PSYCH: normal mood and affect    No visits with results within 1 Week(s) from this visit.   Latest known visit with results is:   Hospital Outpatient Visit on 02/15/2020   Component Date Value Ref Range Status   .  RV Basal Diastolic Dimension 02/15/2020 5.40   Final   . AV Mean Gradient 02/15/2020 5   Final   . Prox Ascending Aorta Diameter 02/15/2020 3.7   Final   . LVID diastole (2D) 02/15/2020 4.93   Final   . LVID diastole (2D) 02/15/2020 4.72   Final   . LVID systole (2D) 02/15/2020 3.04   Final   . IVS Diastolic Thickness (2D) 02/15/2020 1.38   Final   . IVS Diastolic Thickness (2D) 02/15/2020 1.17   Final   . Ao Root Diameter (2D) 02/15/2020 3.7   Final   . LA Dimension (2D) 02/15/2020 4.6   Final   . LA Dimension (2D) 02/15/2020 4.6   Final   . AV Peak Velocity 02/15/2020 159   Final   . TAPSE 02/15/2020 2.77   Final   . MV E/A 02/15/2020 0.9   Final   . MV E/A 02/15/2020 0.939   Final   . AV Area (Cont Eq VTI) 02/15/2020 2.86   Final   . AV Area (Cont Eq VTI) 02/15/2020 2.867   Final   . RV Systolic Pressure 02/15/2020 24   Final   . RV Systolic Pressure  02/15/2020 98.11   Final   . MV Area (PHT) 02/15/2020 3.122   Final   . LA Volume Index (BP A-L) 02/15/2020 0.028   Final   . MV E/e' (Average) 02/15/2020 9.754   Final   . Visually Estimated Ejection Caryl Comes* 02/15/2020 60   Final   . MV Regurgitation Severity 02/15/2020 trace to mild   Final   . TV Regurgitation Severity 02/15/2020 trace   Final   . AV Regurgitation Severity 02/15/2020 mild   Final   . PV Regurgitation Severity 02/15/2020 mild   Final   . Aortic Valve Findings 02/15/2020 There ismild aortic regurgitation.   Final   . Aortic Valve Findings 02/15/2020 There isno aortic stenosis.   Final   . Aortic Valve Findings 02/15/2020 The aortic valve is tricuspid.   Final   . Pulmonary Valve Findings 02/15/2020 There ismild pulmonic regurgitation.   Final   . Pulmonary Valve Findings 02/15/2020 The pulmonic valve is structurally normal   Final   . Mitral Valve Findings 02/15/2020 There is trace to mild mitral regurgitation   Final   . Mitral Valve Findings 02/15/2020 The mitral valve is structurally normal.   Final   . Tricuspid Valve Findings 02/15/2020 No pulmonary hypertension withestimated right ventricular systolic pressureof  25 mmHg.   Final   . Tricuspid Valve Findings 02/15/2020 There is tracetricuspidregurgitation   Final   . Tricuspid Valve Findings 02/15/2020 The tricuspid valve is structurally normal.   Final         Assessment/Plan:    1. Medicare annual wellness visit, subsequent  Preventive health exam    UTD DDS and ophthalmology  Dermatology annual skin cancer screen performed   Podiatry if applicable  - screening and monitoring labs obtained and sent to [facility]  - UTD on vaccines including annual flu shot. Tdap due 2025   COVID booster-- x 4  - safety, dietary concerns, and exercise concerns discussed  -Colon cancer screen due Q 5 years UPTD  -PSA discussed          2. Mixed hyperlipidemia  He is on statin and LDL < 70 optimized  The 10-year ASCVD risk score Denman George Rye Brook Jr., et al., 2013) is:  37.1%    Values used to calculate the score:  Age: 59 years      Sex: Male      Is Non-Hispanic African American: No      Diabetic: No      Tobacco smoker: No      Systolic Blood Pressure: 162 mmHg      Is BP treated: Yes      HDL Cholesterol: 51 mg/dL      Total Cholesterol: 155 mg/dL      3. Essential hypertension  Controlled a home BP log 110-120/70    4. Dupuytren's contracture of left hand  Would like referral for specialist to further manage    5. S/P lumbar laminectomy  He is much improved moving around active with golf again very happy with the outcome    Risks & benefits of the new medication(s) were explained to the patient, who appeared to understand and agrees to the treatment plan.    No follow-ups on file.    [name], MD  1:07 PM 12/15/2020    [facility]  [address]  [place], [place] [address]  P) [phone]  F) [phone]  [email or web address]

## 2021-01-19 ENCOUNTER — Other Ambulatory Visit: Payer: Self-pay | Admitting: Internal Medicine

## 2021-01-19 DIAGNOSIS — I159 Secondary hypertension, unspecified: Secondary | ICD-10-CM

## 2021-01-19 DIAGNOSIS — E782 Mixed hyperlipidemia: Secondary | ICD-10-CM

## 2021-01-19 MED ORDER — AMLODIPINE BESYLATE 5 MG PO TABS
5.0000 mg | ORAL_TABLET | Freq: Every day | ORAL | 1 refills | Status: DC
Start: 2021-01-19 — End: 2021-06-22

## 2021-01-19 MED ORDER — ATORVASTATIN CALCIUM 20 MG PO TABS
20.0000 mg | ORAL_TABLET | Freq: Every evening | ORAL | 1 refills | Status: DC
Start: 2021-01-19 — End: 2021-06-22

## 2021-01-19 NOTE — Telephone Encounter (Signed)
Patient would like a refill of atorvastatin and amlodipine called in to the pharmacy on file.  Says 90 day supply is needed.

## 2021-01-19 NOTE — Addendum Note (Signed)
Addended by: Mancel Bale on: 01/19/2021 10:55 AM     Modules accepted: Orders

## 2021-01-27 ENCOUNTER — Encounter (INDEPENDENT_AMBULATORY_CARE_PROVIDER_SITE_OTHER): Payer: Self-pay

## 2021-04-09 DIAGNOSIS — C434 Malignant melanoma of scalp and neck: Secondary | ICD-10-CM | POA: Insufficient documentation

## 2021-04-15 ENCOUNTER — Telehealth: Payer: Self-pay

## 2021-04-15 NOTE — Telephone Encounter (Signed)
Spoke with patients wife to schedule appointment.

## 2021-04-21 ENCOUNTER — Telehealth: Payer: Self-pay | Admitting: Dermatology

## 2021-04-21 NOTE — Telephone Encounter (Signed)
Patient confirmed multi-discipline appointment via mychart for 04/22/2021. Patient is aware to arrive 30 minutes early to their new patient appointment to complete any paperwork. Patient is aware of visitor policy and mask requirement.

## 2021-04-22 ENCOUNTER — Encounter: Payer: Self-pay | Admitting: Dermatology

## 2021-04-22 ENCOUNTER — Ambulatory Visit: Payer: Medicare Other | Attending: Dermatology | Admitting: Dermatology

## 2021-04-22 VITALS — BP 187/77 | HR 64 | Temp 97.8°F | Resp 16 | Ht 74.4 in | Wt 192.0 lb

## 2021-04-22 DIAGNOSIS — Z1283 Encounter for screening for malignant neoplasm of skin: Secondary | ICD-10-CM | POA: Insufficient documentation

## 2021-04-22 DIAGNOSIS — C434 Malignant melanoma of scalp and neck: Secondary | ICD-10-CM | POA: Insufficient documentation

## 2021-04-22 DIAGNOSIS — L578 Other skin changes due to chronic exposure to nonionizing radiation: Secondary | ICD-10-CM | POA: Insufficient documentation

## 2021-04-22 NOTE — Progress Notes (Signed)
April 22, 2021,     Dr. 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 We are recommending WLE with plastics (Dr. Ethelene Browns). The patient will need a Total body skin examination (TBSE) with you in 3-4 months.    Thank you,    Aurora Mask, MD  A. Jearld Lesch, FNP  ====================================================================================================================================   [facility]    CC: New patient, melanoma of neck      HISTORY OF PRESENT ILLNESS  Deldrick Linch is 75 y.o. male who comes in for initial consult of melanoma of right posterior neck. Patient's wife noted irregular brown lesion on right posterior neck prompting biopsy of the lesion with dermatology.    Oncology History   Malignant melanoma of neck   04/09/2021 Initial Diagnosis    Malignant melanoma of neck     04/09/2021 Biopsy    Biopsy of  right posterior neck showed 0.3 mm thick, non-ulcerated melanoma with 0 mitoses/mm2. Margins were involved by in situ only.            Patient denies any other personal history of melanoma. History of BCC right forehead s/p excision.  States history of moderate outdoor sun exposure. Denies tanning bed use.   Patient denies any family history of melanoma or pancreatic cancer.     Patient denies any palpable lymph nodes. Patient denies any rash/itching, fatigue, weight loss, headache, cough, shortness of breath, nausea, vomiting, diarrhea or abdominal pain.    Retired Oncologist on [employer]  Current Outpatient Medications   Medication Sig Dispense Refill    amLODIPine (NORVASC) 5 MG tablet Take 1 tablet (5 mg total) by mouth daily 90 tablet 1    atorvastatin (Lipitor) 20 MG tablet Take 1 tablet (20 mg total) by mouth nightly 90 tablet 1    Cholecalciferol (Vitamin D3) 50 MCG (2000 UT) Tab Take by mouth      eszopiclone (LUNESTA) 2 MG tablet TAKE 1 TABLET NIGHTLY      IMMEDIATELY BEFORE  BEDTIME 90 tablet 1    losartan (COZAAR) 100 MG tablet Take 1 tablet (100 mg total) by mouth daily 90 tablet 3     No current facility-administered medications for this visit.     No Known Allergies  Past Medical History:   Diagnosis Date    Diverticulitis     Elevated coronary artery calcium score 01/2020    H/O ABNORMAL CORONARY CALCIUM SCORE. 01/22/20 CARD NOTE FROM DR. BONTEMPO IN EPIC. 02/15/20 ECHO RESULTS PENDING IN EPIC.     Hearing loss     wears hearing aids, about 20% bilaterally     Heart murmur 01/22/2020    basal systolic murmur - ASYMPTOMATIC.    Hyperacusis of both ears     participating in audiotherapy     Hyperlipidemia     MANAGED WITH MEDS.    Hypertension      MANAGED WITH MEDS. H/O WHITE COAT SYNDROME.     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    Low back pain     HX    Tinnitus 1993     Past Surgical History:   Procedure Laterality Date    APPENDECTOMY (OPEN)  1973    COLONOSCOPY  02/2011    COLONOSCOPY N/A 08/24/2016    Procedure: COLONOSCOPY;  Surgeon: Lestine Mount, MD;  Location: [facility];  Service: Gastroenterology;  Laterality: N/A;  COLONOSCOPY  EXCISION, CYST  12/2006    back    KNEE ARTHROSCOPY Left 09/2011    LAMINECTOMY, POSTERIOR LUMBAR, DECOMPRESSION, LEVEL 2 Bilateral 03/06/2020    Procedure: BILATERAL L2-L4 LAMINECTOMY;  Surgeon: Jerilynn Mages, MD;  Location: [facility];  Service: Neurosurgery;  Laterality: Bilateral;  BILATERAL L2-L4 LAMINECTOMIES    RELEASE, 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    Vaping Use    Vaping Use: Never used   Substance and Sexual Activity    Alcohol use: Yes     Alcohol/week: 7.0 standard drinks     Types: 7 Glasses of wine per week     Comment: small glasses and only with lunch or dinner    Drug use: No     Social Determinants of Health     Financial Resource Strain: Low Risk     Difficulty of Paying Living Expenses: Not hard at all   Food Insecurity: No Food Insecurity    Worried About Programme researcher, broadcasting/film/video in the Last Year: Never true    Ran Out of Food in the Last Year: Never true   Transportation Needs: No Transportation Needs    Lack of Transportation (Medical): No    Lack of Transportation (Non-Medical): No   Physical Activity: Sufficiently Active    Days of Exercise per Week: 4 days    Minutes of Exercise per Session: 90 min   Stress: No Stress Concern Present    Feeling of Stress : Not at all   Social Connections: Moderately Isolated    Frequency of Communication with Friends and Family: More than three times a week    Frequency of Social Gatherings with Friends and Family: Twice a week    Attends Religious Services: Never    Database administrator or Organizations: No    Attends Engineer, structural: Never    Marital Status: Married   Catering manager Violence: Not At Risk    Fear of Current or Ex-Partner: No    Emotionally Abused: No    Physically Abused: No    Sexually Abused: No   Housing Stability: Low Risk     Unable to Pay for Housing in the Last Year: No    Number of Places Lived in the Last Year: 1    Unstable Housing in the Last Year: No       REVIEW OF SYSTEMS  Constitutional - wnl  Eyes - wnl  Ears, nose, mouth, throat - wnl  Cardiovascular - wnl  Respiratory - wnl  Gastrointestinal - wnl  Genitourinary - wnl  Musculoskeletal - wnl  Skin (integument, includes Breast) - wnl  Neurologic - wnl  Psychiatric - wnl  Endocrine - wnl   Hematologic or Lymphatic - wnl  Allergic or immunologic - wnl     Objective:     Vitals:    04/22/21 1057  BP: 187/77   Pulse:  64   Resp: 16   Temp: 97.8 F (36.6 C)   SpO2: 98%      General appearance: NAD, conversant.A total body skin examination is completed including palpation of scalp and inspection of hair of scalp, eyebrows, face, chest and extremities and inspection of eccrine and apocrine glands.Dermatoscopy was used to evaluate the nevi.General skin: multiple brown macules, size 2-19mm, < 30 nevi, distributed primarily on the trunk. Extensive photodamage and rhytids. The following anatomic skin sites are examined with findings recorded:  Head including face-   Right postauricular skin just over the mastoid prominence- 4 mm smooth pink papule with arborizing vessels (rec excisional biopsy with Dr. Imogene Burn r/o [facility])   Right forehead- scar, no evidence of recurrence (site of BCC)   Neck-   Right posterior neck- 8 mm shave biopsy site with a few dots of brown/grey pigment at 3 o'clock c/w residual melanoma. No evidence of satellite/in-transit lesion  Chest including breasts, and axillae- no lesions of concern  Abdomen- no lesions of concern  Genitalia, groin, buttocks- no lesions of concern  Back- no lesions of concern  Right upper extremity- no lesions of concern  Left upper extremity-no lesions of concern  Right lower extremity- no lesions of concern  Left lower extremity- no lesions of concern  Examination of lymph node basins is completed, including the H and N, axilla and groin. There is no lymphadenopathy.      Pathology reports   Patient Name: Frederick [name]  Date of Birth: [birth year]  Accession #: [id number]  Date of Service: 04/09/2021     Diagnosis  Neck, right posterior:  Malignant melanoma, superficial spreading type, associated with a minute dermal nevus     Specimen type: Shave biopsy  Clark's level: III (3)  Approximate maximum thickness: 0.3 mm  Growth phase: Vertical  Mitotic rate: 0 per mm^2  Ulceration: Not identified  Peripheral margin: Involved by situ  Deep margin: Involved by situ via adnexa  Regression: Not  identified  Lymphovascular invasion: Not identified  Perineural invasion: Not identified  Microscopic satellites: Not identified  Lymphocytic host response: Not identified  Pathologic stage: pT1a (pT AJCC 8th Ed.) (dtt)     Gross  Irregular, 7x4 mm, tan granular surface, inked. Bisected, totally submitted. (Aa)      Assessment & Plan:     1. Melanoma of right posterior neck  -Case discussed at Melanoma Tumor Board. Slides requested.  -Based on the AJCC Staging Guidelines, this is a patient with Stage IA melanoma.  We had the opportunity to review her diagnosis, stage, treatment and follow-up plan in detail. A diagram of the skin was used to discuss the context of Breslow's thickness and to review the biology of melanoma. The rationale for further skin excision was reviewed with the patient. We also discussed the potential adverse effects of surgery which will be reviewed in greater detail by the surgeon (Dr. Ethelene Browns) performing these procedures. Imaging not indicated. We are recommending WLE with Dr. Imogene Burn.  -After surgery, no additional systemic therapy is recommended as patient has excellent prognosis (Melanoma specific survival of 99% at 5 year, 98% at 10 year). We discussed about different pattern of melanoma recurrence - local, nodal, distant - and reviewed  potential symptoms or signs of recurrence. We recommend patient to self exam the melanoma excision scar as well as lymph node basin. We recommend patient to see dermatologist on regular basis for melanoma surveillance.  2. Pink papule right postauricular   -Recommend excisional biopsy with Dr. Imogene Burn at time of WLE,  r/o BCC    3. Skin cancer screening  - total body skin examination performed today    - no other lesions concerning to warrant biopsy today    - Sun protection reviewed    - ABCDEs of melanoma reviewed    - Monthly self-skin examination was advised and the patient was instructed to return to clinic prior to scheduled visit if any suspicious  or worrisome findings are discovered    4. Safe sun practices reviewed including:  -Use of sunscreen and application, need for reapplication  -Sun protective clothing including hats, sunglasses and other    5. Longitudinal follow up  -TBSE every 3-4 months x 2 years (until September 2024), then every 6 months x 3 years (until September 2027). Patient may alternate with referring derm (January 2023, May 2023) and Dr. Wandra Arthurs (annually- due September 2023).      Jeannette Corpus, DNP, FNP-BC  Nurse Practitioner, [facility]  [facility]  [facility]  [address]  [place], [place] [address]  [phone]  [phone]    Suraj S. Ruel Favors, MD, FAAD  Medical Director, [facility]  [facility], [facility]  Associate Professor of Medicine, [employer]  [address] [place], [place] [address]  T [phone]  F [phone]  [email or web address]    CC: Dr. Sherilyn Cooter (PCP)  Dr. Imogene Burn (Plastics)

## 2021-04-22 NOTE — Progress Notes (Signed)
Patient Name: Frederick [name]  Date of Birth: 1945-10-25  Accession #: [id number]  Date of Service: 04/09/2021    Diagnosis  Neck, right posterior:  Malignant melanoma, superficial spreading type, associated with a minute dermal nevus    Specimen type: Shave biopsy  Clark's level: III (3)  Approximate maximum thickness: 0.3 mm  Growth phase: Vertical  Mitotic rate: 0 per mm^2  Ulceration: Not identified  Peripheral margin: Involved by situ  Deep margin: Involved by situ via adnexa  Regression: Not identified  Lymphovascular invasion: Not identified  Perineural invasion: Not identified  Microscopic satellites: Not identified  Lymphocytic host response: Not identified  Pathologic stage: pT1a (pT AJCC 8th Ed.) (dtt)    Gross  Irregular, 7x4 mm, tan granular surface, inked. Bisected, totally submitted. (Aa)

## 2021-04-23 ENCOUNTER — Encounter (FREE_STANDING_LABORATORY_FACILITY): Payer: Medicare Other

## 2021-04-23 ENCOUNTER — Ambulatory Visit: Payer: Self-pay

## 2021-04-23 DIAGNOSIS — C439 Malignant melanoma of skin, unspecified: Secondary | ICD-10-CM

## 2021-04-24 LAB — LAB USE ONLY - HISTORICAL SURGICAL PATHOLOGY

## 2021-05-06 ENCOUNTER — Ambulatory Visit (INDEPENDENT_AMBULATORY_CARE_PROVIDER_SITE_OTHER): Payer: Medicare Other

## 2021-05-06 DIAGNOSIS — Z23 Encounter for immunization: Secondary | ICD-10-CM

## 2021-05-06 NOTE — Progress Notes (Signed)
Flu shot is administered in left deltoid via aseptic technique, patient tolerated well, received VIS and side effects education.     Patient left in good condition.     Instructed to apply a cold compression to injection site if soreness, redness, itching or swelling occurs.    Instructed to take tylenol or motrin if soreness occurs or mild flu like symptoms.   Informed to contact the office if redness, swelling or soreness persists after 3 days.

## 2021-05-18 ENCOUNTER — Other Ambulatory Visit: Payer: Self-pay | Admitting: Internal Medicine

## 2021-05-18 DIAGNOSIS — G479 Sleep disorder, unspecified: Secondary | ICD-10-CM

## 2021-05-18 MED ORDER — ESZOPICLONE 2 MG PO TABS
ORAL_TABLET | ORAL | 1 refills | Status: DC
Start: 2021-05-18 — End: 2021-11-03

## 2021-05-18 NOTE — Addendum Note (Signed)
Addended by: Bethanie Dicker on: 05/18/2021 10:23 AM     Modules accepted: Orders

## 2021-05-18 NOTE — Telephone Encounter (Signed)
Pts wife called.  He needs refill on his eszopiclone 2 mg to the [facility].

## 2021-06-21 ENCOUNTER — Other Ambulatory Visit: Payer: Self-pay | Admitting: Internal Medicine

## 2021-06-21 DIAGNOSIS — E782 Mixed hyperlipidemia: Secondary | ICD-10-CM

## 2021-06-21 DIAGNOSIS — I159 Secondary hypertension, unspecified: Secondary | ICD-10-CM

## 2021-11-03 ENCOUNTER — Other Ambulatory Visit: Payer: Self-pay | Admitting: Internal Medicine

## 2021-11-03 DIAGNOSIS — G479 Sleep disorder, unspecified: Secondary | ICD-10-CM

## 2021-11-03 NOTE — Telephone Encounter (Signed)
Pts wife call and he needs refill on his Eszopiclone 2 mg sent to [facility].

## 2021-11-04 MED ORDER — ESZOPICLONE 2 MG PO TABS
ORAL_TABLET | ORAL | 1 refills | Status: DC
Start: 2021-11-04 — End: 2022-02-15

## 2021-12-16 ENCOUNTER — Other Ambulatory Visit: Payer: Self-pay | Admitting: Internal Medicine

## 2021-12-16 DIAGNOSIS — Z0000 Encounter for general adult medical examination without abnormal findings: Secondary | ICD-10-CM

## 2021-12-16 DIAGNOSIS — E782 Mixed hyperlipidemia: Secondary | ICD-10-CM

## 2021-12-16 DIAGNOSIS — I2510 Atherosclerotic heart disease of native coronary artery without angina pectoris: Secondary | ICD-10-CM

## 2021-12-16 DIAGNOSIS — E559 Vitamin D deficiency, unspecified: Secondary | ICD-10-CM

## 2021-12-16 DIAGNOSIS — I10 Essential (primary) hypertension: Secondary | ICD-10-CM

## 2021-12-16 NOTE — Progress Notes (Signed)
Labs and EKG    [name], MD  3:24 PM 12/16/2021     [facility]  [address]  [place], [place] [address]  P) [phone]  F) [phone]  [email or web address]

## 2021-12-17 ENCOUNTER — Ambulatory Visit (FREE_STANDING_LABORATORY_FACILITY): Payer: Medicare Other

## 2021-12-17 ENCOUNTER — Encounter: Payer: Self-pay | Admitting: Internal Medicine

## 2021-12-17 ENCOUNTER — Ambulatory Visit (INDEPENDENT_AMBULATORY_CARE_PROVIDER_SITE_OTHER): Payer: Medicare Other | Admitting: Internal Medicine

## 2021-12-17 VITALS — BP 157/83 | HR 63 | Temp 97.6°F | Resp 14 | Ht 74.0 in | Wt 191.9 lb

## 2021-12-17 DIAGNOSIS — I2510 Atherosclerotic heart disease of native coronary artery without angina pectoris: Secondary | ICD-10-CM

## 2021-12-17 DIAGNOSIS — C434 Malignant melanoma of scalp and neck: Secondary | ICD-10-CM

## 2021-12-17 DIAGNOSIS — E559 Vitamin D deficiency, unspecified: Secondary | ICD-10-CM

## 2021-12-17 DIAGNOSIS — Z9889 Other specified postprocedural states: Secondary | ICD-10-CM

## 2021-12-17 DIAGNOSIS — M720 Palmar fascial fibromatosis [Dupuytren]: Secondary | ICD-10-CM

## 2021-12-17 DIAGNOSIS — Z125 Encounter for screening for malignant neoplasm of prostate: Secondary | ICD-10-CM

## 2021-12-17 DIAGNOSIS — I10 Essential (primary) hypertension: Secondary | ICD-10-CM

## 2021-12-17 DIAGNOSIS — E782 Mixed hyperlipidemia: Secondary | ICD-10-CM

## 2021-12-17 DIAGNOSIS — Z0000 Encounter for general adult medical examination without abnormal findings: Secondary | ICD-10-CM

## 2021-12-17 LAB — CBC AND DIFFERENTIAL
Absolute NRBC: 0 10*3/uL (ref 0.00–0.00)
Basophils Absolute Automated: 0.01 10*3/uL (ref 0.00–0.08)
Basophils Automated: 0.2 %
Eosinophils Absolute Automated: 0.1 10*3/uL (ref 0.00–0.44)
Eosinophils Automated: 1.8 %
Hematocrit: 45.8 % (ref 37.6–49.6)
Hgb: 15.4 g/dL (ref 12.5–17.1)
Immature Granulocytes Absolute: 0.01 10*3/uL (ref 0.00–0.07)
Immature Granulocytes: 0.2 %
Instrument Absolute Neutrophil Count: 4.03 10*3/uL (ref 1.10–6.33)
Lymphocytes Absolute Automated: 0.99 10*3/uL (ref 0.42–3.22)
Lymphocytes Automated: 18 %
MCH: 31.8 pg (ref 25.1–33.5)
MCHC: 33.6 g/dL (ref 31.5–35.8)
MCV: 94.4 fL (ref 78.0–96.0)
MPV: 9.7 fL (ref 8.9–12.5)
Monocytes Absolute Automated: 0.36 10*3/uL (ref 0.21–0.85)
Monocytes: 6.5 %
Neutrophils Absolute: 4.03 10*3/uL (ref 1.10–6.33)
Neutrophils: 73.3 %
Nucleated RBC: 0 /100 WBC (ref 0.0–0.0)
Platelets: 246 10*3/uL (ref 142–346)
RBC: 4.85 10*6/uL (ref 4.20–5.90)
RDW: 13 % (ref 11–15)
WBC: 5.5 10*3/uL (ref 3.10–9.50)

## 2021-12-17 LAB — LIPID PANEL
Cholesterol / HDL Ratio: 2.8 Index
Cholesterol: 154 mg/dL (ref 0–199)
HDL: 56 mg/dL (ref 40–9999)
LDL Calculated: 83 mg/dL (ref 0–99)
Triglycerides: 75 mg/dL (ref 34–149)
VLDL Calculated: 15 mg/dL (ref 10–40)

## 2021-12-17 LAB — URINALYSIS, REFLEX TO MICROSCOPIC EXAM IF INDICATED
Bilirubin, UA: NEGATIVE
Blood, UA: NEGATIVE
Glucose, UA: NEGATIVE
Ketones UA: NEGATIVE
Leukocyte Esterase, UA: NEGATIVE
Nitrite, UA: NEGATIVE
Specific Gravity UA: 1.019 (ref 1.001–1.035)
Urine pH: 6 (ref 5.0–8.0)
Urobilinogen, UA: NORMAL mg/dL

## 2021-12-17 LAB — COMPREHENSIVE METABOLIC PANEL
ALT: 31 U/L (ref 0–55)
AST (SGOT): 32 U/L (ref 5–41)
Albumin/Globulin Ratio: 1.5 (ref 0.9–2.2)
Albumin: 4 g/dL (ref 3.5–5.0)
Alkaline Phosphatase: 52 U/L (ref 37–117)
Anion Gap: 9 (ref 5.0–15.0)
BUN: 12 mg/dL (ref 9.0–28.0)
Bilirubin, Total: 2.2 mg/dL — ABNORMAL HIGH (ref 0.2–1.2)
CO2: 25 mEq/L (ref 17–29)
Calcium: 8.8 mg/dL (ref 7.9–10.2)
Chloride: 107 mEq/L (ref 99–111)
Creatinine: 0.8 mg/dL (ref 0.5–1.5)
Globulin: 2.7 g/dL (ref 2.0–3.6)
Glucose: 108 mg/dL — ABNORMAL HIGH (ref 70–100)
Potassium: 4.1 mEq/L (ref 3.5–5.3)
Protein, Total: 6.7 g/dL (ref 6.0–8.3)
Sodium: 141 mEq/L (ref 135–145)
eGFR: >60 mL/min/{1.73_m2} (ref >=60)

## 2021-12-17 LAB — PROSTATE SPECIFIC ANTIGEN SCREEN: Prostate Specific Antigen Screen: 1.366 ng/mL (ref 0.000–4.000)

## 2021-12-17 LAB — HEMOLYSIS INDEX: Hemolysis Index: 5 Index (ref 0–24)

## 2021-12-17 LAB — TSH: TSH: 2.48 u[IU]/mL (ref 0.35–4.94)

## 2021-12-17 LAB — VITAMIN D,25 OH,TOTAL: Vitamin D, 25 OH, Total: 51 ng/mL (ref 30–100)

## 2021-12-17 NOTE — Progress Notes (Signed)
Audiology test: Deferred by the patient. He wears hearing aids and is suffering from tinnitus at today's appointment.     Vision test:  Deferred by the patient. He receives annual eye exams.     Pulmonary Function Test: Not offered in the present setting of COVID-19 due to safety risk vs benefit.     Fitness Evaluation:   Weight  2023 = 191.6 lbs  2022 = 192.9 lbs    Body Fat as percentage: In men, over 25% is obese, 20-25% is higher than normal, 16-20% is healthy / normal, <16% or under is considered lean / ideal.  2023 = 18.1%  2022 = 18.3%    Visceral Fat Level: Visceral fat is an indicator of the estimated fat surrounding the internal organs in the abdomen. High visceral fat levels increase the risk for developing heart disease and Type 2 Diabetes. A level of under 10 is recommended for optimal health.  2023 = 7  2022 = 7    Current Exercise Program:  In[employer] results reviewed in detail and compared to prior results.    Over age 62: Discussed the importance of strength training. Strength training provides a greater boost to metabolism than cardio. Through strength training, you can combat the natural decline of lean muscle mass that occurs around age 57 and beyond. People over age 60 lose 8% of their muscle mass every decade. Encouraged patient to begin strength training to build muscle mass and prevent muscle atrophy. Discussed with patient that building core muscle to help with balance and leg strength to help with muscle fatigue will help prolong the use of aids such as walkers and canes in the future. Patient understands that strength training also helps with bone density.    Fitness Goals:  Increase overall strength    Immunizations:  Immunization record is up to date.     Health Maintenance:  Colonoscopy: DUE

## 2021-12-17 NOTE — Progress Notes (Unsigned)
Chief Complaint   Patient presents with    Medicare Annual Wellness Visit       History of Present Illness    Frederick [name] is a 76 y.o.  male here for evaluation and treatment of the following concerns. Celine Mans Gibler is his wife. 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 - Dr Francee Nodal   3.Insomnia - Lunesta 2 mg needs a refill  4.Improved Back pain - stiffness - xrays show L4-L5 Jumped off a Bannister at a young age - glucosamine, Dr Verl Dicker did discectomy and now he feels pain controlled with stretch and massage, he is able to golf again  5.HTN controlled on Losaratan 100 mg , White coat HTN at home BP are > 140 recommend to monitor and share BP log in 2 weeks  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 Veggie sausage  Lunch - dinner, sandwhiches  Snack sugar snaps with hummus  Dinner - homecooked meal, [employer]  193 - salmon, shrimp filet 2 x a month   Soda - none  Juice - none  Coffee - de-cafe coffee  Water - seltzer  Drink a little less wine    EXERCISE: 4 x a week every other day  Treadmill 30- 45 minutes he has been less compliant   Total Gym 20 mins    SLEEP: 11 - 4 to urinate , 6-7 am gets up, he does have about 6 hours of sleep, every night with Lunesta 2 mg QHS with in 10-15 mins    STRESS: self care with attitude and exercise    CANCER:No family history of uterine, cervical, ovary, colon, prostate  PGM and PAunt - radical mastectomy    HEALTH MAINTENANCE:  Immunizations-   Covid 4 shots total - Moderna - muscle soreness  Tdap   Flu vaccine   ShingRx-  will f/u at pharmacy/wait list    COLON:4 years will repeat next year - 2018 and then repeat in 5 years   ZOX:WRUEA   SKIN:dermatologist   Opthalmologist - slow growing cataract, specialist who is watching the retina  Dental : UPTD had some dental work in place              [facility]            Frederick [name] is a 76 y.o. male who presents today for the following Medicare Wellness Visit:  []  Initial Preventive Physical Exam (IPPE) - "Welcome to Medicare" preventive visit (Vision Screening required)   []  Annual Wellness Visit - Initial  [x]  Annual Wellness Visit - Subsequent     Health Risk Assessment:   During the past month, how would you rate your general health?:  Very Good  Which of the following tasks can you do without assistance - drive or take the bus alone; shop for groceries or clothes; prepare your own meals; do your own housework/laundry; handle your own finances/pay bills; eat, bathe or get around your home?: Drive or take the bus alone, Handle your own finances/pay bills, Shop for groceries or clothes, Eat, bathe, dress or get around your home, Prepare your own meals, Do your own housework/laundry  Which of the following problems have you been bothered by in the past month - dizzy when standing up; problems using the  phone; feeling tired or fatigued; moderate or severe body pain?: None of these  Do you exercise for about 20 minutes 3 or more days per week?:Yes  During the past month was someone available to help if you needed and wanted help?  For example, if you felt nervous, lonely, got sick and had to stay in bed, needed someone to talk to, needed help with daily chores or needed help just taking care of yourself.: Yes  Do you always wear a seat belt?: Yes  Do you have any trouble taking medications the way you have been told to take them?: No  Have you been given any information that can help you with keeping track of your medications?: No  Do you have trouble paying for your medications?: No  Have you been given any information that can help you with hazards in your house, such as scatter rugs, furniture, etc?: No  Do you feel unsteady when standing or walking?: No  Do you worry about falling?: No  Have you fallen two or more times in the past year?: No  Did you suffer any injuries from your falls in the past year?: No     Care Team:   Patient Care Team:  Tressia Miners, MD as PCP - General (Internal Medicine)  Jerilynn Mages, MD as Consulting Physician (Neurological Surgery)  Pess, Janet Berlin, MD as Consulting Physician (Hand Surgery)  Clinton Sawyer, Gelene Mink, MD as Consulting Physician (Cardiology)  Rulon Sera, OOD as Consulting Physician (Optometry)  Tana Felts, MD as Consulting Physician (Dermatology)      Hospitalizations:   Hospitalization within past year: [x]  No  []  Yes     Diagnosis:      Screenings:       12/15/2020    12:13 PM 12/17/2021     9:48 AM 12/17/2021     9:49 AM   Ambulatory Screenings   Falls Risk: Fallen more than 2 times in past year N  N   Falls Risk: Suffer any injuries? N  N   Depression: PHQ2 Total Score  0    Depression: PHQ9 Total Score  0         Substance Use Disorder Screen:  In the past year, how often have you used the following?  1) Alcohol (For men, 5 or more drinks a day. For  women, 4 or more drinks a day)  [x]  Never []   Once or Twice []  Monthly []  Weekly []  Daily or Almost Daily  2) Tobacco Products  [x]  Never []  Once or Twice []  Monthly []  Weekly []  Daily or Almost Daily  3) Prescription Drugs for Non-Medical Reasons  [x]  Never []  Once or Twice []  Monthly []  Weekly []  Daily or Almost Daily  4) Illegal Drugs  [x]  Never []  Once or Twice []  Monthly []  Weekly []  Daily or Almost Daily     Male Alcohol Risk Factor Screening:   1)  Do you average more than 1 drink per night or more than 7 drinks a week:            no  2)  On any one occasion in the past three months have you have had more than        3 drinks containing alcohol: no           Functional Ability/Level of Safety:   Falls Risk/Home Safety Assessment:  ( see HRA and Screenings sections for additional assessment)  Home Safety: [x]  Stair handrails  [x]  Skid-resistant rugs/remove throw rugs   [x]  Grab bars  [x]  Clear pathways between rooms  [x]  Proper lighting stairs/ bathrooms/bedrooms  Get Up and Go (optional):  [x]   <20 secs  []   >20 secs    []   High risk for falls - Home Safety/Falls Risk Precautions reviewed with pt/family    Hearing Assessment:  Concerns for hearing loss: [x]  Yes  []   No  Hearing aids:   []   Right  []   Left  [x]   Bilateral   []   None  Whisper Test (optional):  []  Normal  []   Slightly decreased  [x]   Significantly decreased    Exercise:  Frequency:  []   No formal exercise  []   1-2x/wk  []   3-4x/wk  [x]   >4x/wk  Duration:  []   15-30 mins/day  []   30-45 mins/day  [x]   45+ mins/day  Intensity:  []   Light  [x]   Moderate  []   Heavy        Activities of Daily Living:   ADL's Independent Minimal  Assistance Moderate  Assistance Total   Assistance   Bathing [x]  []  []  []    Dressing [x]  []  []  []    Mobility   [x]  []  []  []    Transfer [x]  []  []  []    Eating [x]  []  []  []    Toileting [x]  []  []  []      IADL's Independent Minimal  Assistance Moderate  Assistance Total   Assistance   Phone [x]  []  []  []    Housekeeping [x]  []  []  []     Laundry [x]  []  []  []    Transportation [x]  []  []  []    Medications [x]  []  []  []    Finances [x]  []  []  []       ADL assistance: [x]  No assistance needed  []  Spouse  []  Sibling  []  Son   []  Daughter []  Children  []  Home Health Aide []  Other:       Advance Care Planning:   Discussion of Advance Directives:   [x]  Advance Directive in chart  []  Advance Directive not in chart - requested to provide []  No Advance Directive.  Form Provided  []  No Advance Directive.  Pt declines. []  Not addressed today  []  Other:     Exam:   BP 157/83 (BP Site: Right arm, Patient Position: Sitting, Cuff Size: Large)   Pulse 63   Temp 97.6 F (36.4 C) (Temporal)   Resp 14  Ht 1.88 m (6\' 2" )   Wt 87 kg (191 lb 14.4 oz)   SpO2 99%   BMI 24.64 kg/m               Review of Systems  CONST: No weight change, no fevers/chills or sweats, fatigue, muscle aches  EYES: No blurry vision, double vision, loss of peripheral vision  EARS: No hearing loss, severe tinnitus, pain or discharge.   NOSE: No congestion, runny nose or bloody nose  MOUTH: No sore throat, oral lesions, difficulty swallowing, no change in character of voice  NECK: No swollen glands, stiffness or pain  CV: No chest pain, palpitations, leg swelling  RESP: No shortness of breath, cough, wheeze, asthma  GI: No n/v, diarrhea, constipation, abd pain, heartburn, no change in caliber/color of stool  MALE GU: No dysuria, frequency, hematuria, nocturia, testicular changes, penile discharge, erectile dysfunction  MSK:  No joint or muscle pain, swelling or weakness, dupuytren contracture of b/l hands  SKIN:   Melanoma on the back of neck, No rashes lumps, sores  ENDO:  No heat/cold intolerance, polyuria, polydipsia, polyphagia  HEME:  No bruising/bleeding, swollen glands  NEURO:  No headache, lightneadedness, dizziness, loss of consciousness, numbness/tingling, weakness  PSYCH:  No depressed mood, anhedonia, anxiety      GEN: well developed well nourished male alert and appropriate in no  apparent distress  HENT: tympanic membrane normal bilaterally, no nasal congestion, oropharynx normal w no exudate or erythema  EYES: PERRL, EOMI, no pallor or scleral icterus, normal conjunctiva  NECK: supple, no thyromegaly or nodules appreciated  LYMPH: no cervical or supraclavicular lymphadenopathy appreciated  CV: RRR, no m/r/g.  No LE edema.  2+ radial pulses present and equal.  RESP: CTAB, normal effort, no rhonchi or rales  GI: soft, nontender/ nondistended, NABS, no rebound or guarding, no evident hepatosplenomegaly  GU: normal external genitalia no masses or hernia evident or palpable  SKIN: warm, dry.  no visible rashes or concerning moles  MSK: Normal bulk and tone, normal strength 5/5 and sensation UE / LE  NEURO: PERRLA, EOMI, face symmetric, hearing intact to voice, palate raise, shoulder shrug and neck flexion intact, tongue midline. Moves all extremities.  PSYCH: normal mood and affect  Evaluation of Cognitive Function:   Mood/affect: [x]  Appropriate  []   Other:   Appearance: [x]  Neatly groomed  [x]  Adequately nourished  []  Other:  Family member/caregiver input: []  Present - no concerns  []   Not present in room  []  Present - concerns:    Cognitive Assessment:  Mini-Cog Result (three word registration- banana, sunrise, chair / clock drawing):   [x]   > 3 points - negative screen for dementia   []  3 recalled words - negative screen for dementia   []  1-2 recalled words and normal clock draw - negative for cognitive impairment   []  1-2 recalled words and abnormal clock draw - positive for cognitive impairment   []  0 recalled words - positive for cognitive impairment             History:   Patient Active Problem List   Diagnosis    Hyperlipidemia    Insomnia    Tinnitus    Labile blood pressure    Dupuytren's contracture of both hands    Hyperacusis of both ears    Dupuytren's contracture of left hand    ERRONEOUS ENCOUNTER--DISREGARD    Lumbar spondylosis    Coronary artery disease involving native  coronary artery of native heart  without angina pectoris    Essential hypertension    DDD (degenerative disc disease), lumbar    Spinal stenosis of lumbar region without neurogenic claudication    S/P lumbar laminectomy    Malignant melanoma of neck      Past Medical History:   Diagnosis Date    Diverticulitis     Elevated coronary artery calcium score 01/2020    H/O ABNORMAL CORONARY CALCIUM SCORE. 01/22/20 CARD NOTE FROM DR. BONTEMPO IN EPIC. 02/15/20 ECHO RESULTS PENDING IN EPIC.     Hearing loss     wears hearing aids, about 20% bilaterally     Heart murmur 01/22/2020    basal systolic murmur - ASYMPTOMATIC.    Hyperacusis of both ears     participating in audiotherapy     Hyperlipidemia     MANAGED WITH MEDS.    Hypertension      MANAGED WITH MEDS. H/O WHITE COAT SYNDROME.     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    Low back pain     HX    Melanoma 2022    Back of neck, removed by Dr. Francee Nodal (Derm)    Tinnitus 1993     Past Surgical History:   Procedure Laterality Date    APPENDECTOMY (OPEN)  1973    COLONOSCOPY  02/2011    COLONOSCOPY N/A 08/24/2016    Procedure: COLONOSCOPY;  Surgeon: Lestine Mount, MD;  Location: [facility];  Service: Gastroenterology;  Laterality: N/A;  COLONOSCOPY    EXCISION, CYST  12/2006    back    KNEE ARTHROSCOPY Left 09/2011    LAMINECTOMY, POSTERIOR LUMBAR, DECOMPRESSION, LEVEL 2 Bilateral 03/06/2020    Procedure: BILATERAL L2-L4 LAMINECTOMY;  Surgeon: Jerilynn Mages, MD;  Location: [facility];  Service: Neurosurgery;  Laterality: Bilateral;  BILATERAL L2-L4 LAMINECTOMIES    RELEASE, 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    RELEASE, 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    ROTATOR CUFF REPAIR Left 09/2000    and shaving of bone spur     No Known Allergies   Outpatient Medications Marked as Taking for the 12/17/21 encounter (Office Visit) with Tressia Miners, MD   Medication Sig Dispense Refill    amLODIPine (NORVASC) 5 MG tablet TAKE 1 TABLET DAILY 90 tablet 1    atorvastatin  (LIPITOR) 20 MG tablet TAKE 1 TABLET NIGHTLY 90 tablet 1    Cholecalciferol (Vitamin D3) 50 MCG (2000 UT) Tab Take by mouth daily      eszopiclone (LUNESTA) 2 MG tablet TAKE 1 TABLET NIGHTLY      IMMEDIATELY BEFORE BEDTIME 90 tablet 1    losartan (COZAAR) 100 MG tablet Take 1 tablet (100 mg total) by mouth daily 90 tablet 3     Social History     Tobacco Use    Smoking status: Never    Smokeless tobacco: Never    Tobacco comments:     briefly tried pipes, cigars & cigarettes 1964-65; nothing since.   Vaping Use    Vaping status: Never Used   Substance Use Topics    Alcohol use: Yes     Alcohol/week: 7.0 standard drinks of alcohol     Types: 7 Glasses of wine per week     Comment: small glasses and only with lunch or dinner    Drug use: No      Family History   Problem Relation Age of Onset    Alzheimer's disease Mother     Heart attack Father  Stroke Paternal Grandfather                No visits with results within 1 Week(s) from this visit.   Latest known visit with results is:   Clinical Support on 12/15/2020   Component Date Value Ref Range Status    Vitamin D, 25 OH, Total 12/15/2020 41  30 - 100 ng/mL Final    Urine Type 12/15/2020 Clean Catch   Final    Color, UA 12/15/2020 YELLOW  Colorless - Yellow Final    Clarity, UA 12/15/2020 CLOUDY (A)  Clear - Hazy Final    Specific Gravity UA 12/15/2020 1.021  1.001 - 1.035 Final    Urine pH 12/15/2020 6.0  5.0 - 8.0 Final    Leukocyte Esterase, UA 12/15/2020 NEGATIVE  Negative Final    Nitrite, UA 12/15/2020 NEGATIVE  Negative Final    Protein, UR 12/15/2020 30 (A)  Negative Final    Glucose, UA 12/15/2020 NEGATIVE  Negative Final    Ketones UA 12/15/2020 NEGATIVE  Negative Final    Urobilinogen, UA 12/15/2020 0.2  0.2 - 2.0 Final    Bilirubin, UA 12/15/2020 NEGATIVE  Negative Final    Blood, UA 12/15/2020 NEGATIVE  Negative Final    Cholesterol 12/15/2020 155  0 - 199 mg/dL Final    Triglycerides 12/15/2020 114  34 - 149 mg/dL Final    HDL 16/10/960405/23/2022 51  40 -  9,999 mg/dL Final    LDL Calculated 12/15/2020 81  0 - 99 mg/dL Final    VLDL Calculated 12/15/2020 23  10 - 40 mg/dL Final    Cholesterol / HDL Ratio 12/15/2020 3.0  See Below Final    WBC 12/15/2020 4.73  3.10 - 9.50 x10 3/uL Final    Hgb 12/15/2020 14.9  12.5 - 17.1 g/dL Final    Hematocrit 54/09/811905/23/2022 46.5  37.6 - 49.6 % Final    Platelets 12/15/2020 245  142 - 346 x10 3/uL Final    RBC 12/15/2020 4.73  4.20 - 5.90 x10 6/uL Final    MCV 12/15/2020 98.3 (H)  78.0 - 96.0 fL Final    MCH 12/15/2020 31.5  25.1 - 33.5 pg Final    MCHC 12/15/2020 32.0  31.5 - 35.8 g/dL Final    RDW 14/78/295605/23/2022 12  11 - 15 % Final    MPV 12/15/2020 10.0  8.9 - 12.5 fL Final    Neutrophils 12/15/2020 66.8  None % Final    Lymphocytes Automated 12/15/2020 20.5  None % Final    Monocytes 12/15/2020 10.6  None % Final    Eosinophils Automated 12/15/2020 1.7  None % Final    Basophils Automated 12/15/2020 0.2  None % Final    Immature Granulocytes 12/15/2020 0.2  None % Final    Nucleated RBC 12/15/2020 0.0  0.0 - 0.0 /100 WBC Final    Neutrophils Absolute 12/15/2020 3.16  1.10 - 6.33 x10 3/uL Final    Lymphocytes Absolute Automated 12/15/2020 0.97  0.42 - 3.22 x10 3/uL Final    Monocytes Absolute Automated 12/15/2020 0.50  0.21 - 0.85 x10 3/uL Final    Eosinophils Absolute Automated 12/15/2020 0.08  0.00 - 0.44 x10 3/uL Final    Basophils Absolute Automated 12/15/2020 0.01  0.00 - 0.08 x10 3/uL Final    Immature Granulocytes Absolute 12/15/2020 0.01  0.00 - 0.07 x10 3/uL Final    Absolute NRBC 12/15/2020 0.00  0.00 - 0.00 x10 3/uL Final    Glucose 12/15/2020 115 (H)  70 - 100 mg/dL Final    BUN 16/04/9603 13.0  9.0 - 28.0 mg/dL Final    Creatinine 54/03/8118 0.8  0.5 - 1.5 mg/dL Final    Sodium 14/78/2956 139  136 - 145 mEq/L Final    Potassium 12/15/2020 4.4  3.5 - 5.1 mEq/L Final    Chloride 12/15/2020 105  100 - 111 mEq/L Final    CO2 12/15/2020 25  21 - 29 mEq/L Final    Calcium 12/15/2020 8.8  7.9 - 10.2 mg/dL Final    Protein, Total  12/15/2020 6.9  6.0 - 8.3 g/dL Final    Albumin 21/30/8657 3.9  3.5 - 5.0 g/dL Final    AST (SGOT) 84/69/6295 28  5 - 34 U/L Final    ALT 12/15/2020 29  0 - 55 U/L Final    Alkaline Phosphatase 12/15/2020 49  37 - 117 U/L Final    Bilirubin, Total 12/15/2020 1.8 (H)  0.2 - 1.2 mg/dL Final    Globulin 28/41/3244 3.0  2.0 - 3.6 g/dL Final    Albumin/Globulin Ratio 12/15/2020 1.3  0.9 - 2.2 Final    Anion Gap 12/15/2020 9.0  5.0 - 15.0 Final    TSH 12/15/2020 2.20  0.35 - 4.94 uIU/mL Final    Prostate Specific Antigen, Total 12/15/2020 1.354  0.000 - 4.000 ng/mL Final    EGFR 12/15/2020 >60.0   Final    Hemolysis Index 12/15/2020 4  0 - 24 Final    Hemoglobin A1C 12/15/2020 5.3  4.6 - 5.9 % Final    Average Estimated Glucose 12/15/2020 105.4  mg/dL Final    RBC, UA 07/28/7251 0-2  0 - 5 /hpf Final    WBC, UA 12/15/2020 0-5  0 - 5 /hpf Final    Squamous Epithelial Cells, Urine 12/15/2020 0-5  0 - 25 /hpf Final    Urine Bacteria 12/15/2020 NONE SEEN  Rare /hpf Final    Hyaline Casts, UA 12/15/2020 0-5  0 - 5 /lpf Final         Assessment/Plan:   There are no diagnoses linked to this encounter.            1. Medicare annual wellness visit, subsequent    Preventive health exam    UTD DDS and ophthalmology  Dermatology annual skin cancer screen performed Dr Francee Nodal recent diagnosis of   Podiatry if applicable  - screening and monitoring labs obtained and sent to [facility] and discussed at the time of the visit  - UTD on vaccines including annual flu shot. Tdap due 03/2014   COVID booster-- BiVALENT x 2   - safety, dietary concerns, and exercise concerns discussed  -Colon cancer screen due uptodate    The 10-year ASCVD risk score (Arnett DK, et al., 2019) is: 36.7%    Values used to calculate the score:      Age: 44 years      Sex: Male      Is Non-Hispanic African American: No      Diabetic: No      Tobacco smoker: No      Systolic Blood Pressure: 157 mmHg      Is BP treated: Yes      HDL Cholesterol: 56 mg/dL      Total  Cholesterol: 154 mg/dL      2. Dupuytren's contracture of left hand    - Referral to Plastic Surgery - EXTERNAL; Future    3. Malignant melanoma of neck  4. S/P lumbar laminectomy        Risks & benefits of the new medication(s) were explained to the patient, who appeared to understand and agrees to the treatment plan.    No follow-ups on file.    [name], MD  10:50 AM 12/17/2021     [facility]  [address]  [place], [place] [address]  P) [phone]  F) [phone]  [email or web address]

## 2021-12-17 NOTE — Progress Notes (Signed)
Testing:  Labs drawn from left AC without difficulty using aseptic technique. Pressure applied to site for 1-2 min to prevent hematoma. Patient tolerated well. Urine specimen collected. Patient instructed on clean-catch technique per protocol. Patient verbalized understanding. Lab specimen sent to [facility].

## 2021-12-29 ENCOUNTER — Other Ambulatory Visit: Payer: Self-pay

## 2021-12-29 DIAGNOSIS — Z23 Encounter for immunization: Secondary | ICD-10-CM

## 2021-12-29 NOTE — Progress Notes (Signed)
Dear Frederick [name]:     It was a pleasure meeting with you. I wanted to review with you the results of your recent lab tests:    - PSA 1.366 normal 1-4  - Vit D normal > 30   - TSH normal 0- 4  - Electrolytes and kidney function are normal  - Liver functions are normal  - Fasting blood sugar is mildly elevated 108   - Total cholesterol is 154 (goal < 200) triglycerides are 75 (goal < 150), HDL or good cholesterol is 56 (goal >40 ) LDL or bad cholesterol is 83 (numeric goal depends on overall cardiac risk estimation)  - Blood count is normal with no evidence of infection or anemia  - Urinalysis is normal    Please let me know if you have any concerns or questions.    Best regards,    [name], MD  12:13 AM 12/29/2021    [facility]  [address]  [place], [place] [address]  P) [phone]  F) [phone]  [email or web address]

## 2021-12-31 ENCOUNTER — Encounter: Payer: Self-pay | Admitting: Internal Medicine

## 2022-01-05 ENCOUNTER — Telehealth: Payer: Self-pay

## 2022-01-05 ENCOUNTER — Other Ambulatory Visit: Payer: Self-pay | Admitting: Internal Medicine

## 2022-01-05 DIAGNOSIS — I159 Secondary hypertension, unspecified: Secondary | ICD-10-CM

## 2022-01-05 DIAGNOSIS — E782 Mixed hyperlipidemia: Secondary | ICD-10-CM

## 2022-01-05 NOTE — Telephone Encounter (Signed)
Pt's wife called in, needs prior auth for      amLODIPine (NORVASC) 5 MG tablet   losartan (COZAAR) 100 MG tablet  atorvastatin (LIPITOR) 20 MG tablet

## 2022-01-06 ENCOUNTER — Other Ambulatory Visit: Payer: Self-pay | Admitting: Internal Medicine

## 2022-01-06 DIAGNOSIS — E782 Mixed hyperlipidemia: Secondary | ICD-10-CM

## 2022-01-06 DIAGNOSIS — I159 Secondary hypertension, unspecified: Secondary | ICD-10-CM

## 2022-01-06 MED ORDER — LOSARTAN POTASSIUM 100 MG PO TABS
100.0000 mg | ORAL_TABLET | Freq: Every day | ORAL | 3 refills | Status: DC
Start: 2022-01-06 — End: 2022-01-06

## 2022-01-06 MED ORDER — LOSARTAN POTASSIUM 100 MG PO TABS
100.0000 mg | ORAL_TABLET | Freq: Every day | ORAL | 3 refills | Status: DC
Start: 2022-01-06 — End: 2022-12-06

## 2022-01-06 NOTE — Progress Notes (Signed)
All 3 meds requested ordered 90 days supply sent to [facility] mail order    [name], MD  9:12 AM 01/06/2022    [facility]  [address]  [place], [place] [address]  P) [phone]  F) [phone]  [email or web address]

## 2022-02-11 NOTE — Progress Notes (Signed)
MD note from physical in May 2023, as well as labs and referral to plastic surgery faxed to Dr. Toney Rakes office to fax [phone].

## 2022-02-15 ENCOUNTER — Other Ambulatory Visit: Payer: Self-pay | Admitting: Internal Medicine

## 2022-02-15 DIAGNOSIS — G479 Sleep disorder, unspecified: Secondary | ICD-10-CM

## 2022-02-15 MED ORDER — ESZOPICLONE 2 MG PO TABS
ORAL_TABLET | ORAL | 1 refills | Status: DC
Start: 2022-02-15 — End: 2022-08-19

## 2022-02-15 NOTE — Telephone Encounter (Signed)
Pts wife called and patient needs his Lunesta refilled [facility] to [facility].

## 2022-03-17 ENCOUNTER — Other Ambulatory Visit: Payer: Self-pay | Admitting: Internal Medicine

## 2022-03-17 DIAGNOSIS — R0981 Nasal congestion: Secondary | ICD-10-CM

## 2022-03-17 DIAGNOSIS — R519 Headache, unspecified: Secondary | ICD-10-CM

## 2022-04-02 ENCOUNTER — Ambulatory Visit (INDEPENDENT_AMBULATORY_CARE_PROVIDER_SITE_OTHER): Payer: Medicare Other | Admitting: Cardiovascular Disease

## 2022-04-05 ENCOUNTER — Ambulatory Visit: Payer: Medicare Other | Admitting: Dermatology

## 2022-04-07 ENCOUNTER — Ambulatory Visit
Admission: RE | Admit: 2022-04-07 | Discharge: 2022-04-07 | Disposition: A | Discharge status: Home / Self Care | Payer: Medicare HMO | Source: Ambulatory Visit | Attending: Internal Medicine | Admitting: Internal Medicine

## 2022-04-07 DIAGNOSIS — R519 Headache, unspecified: Secondary | ICD-10-CM

## 2022-04-07 DIAGNOSIS — R0981 Nasal congestion: Secondary | ICD-10-CM

## 2022-04-12 ENCOUNTER — Ambulatory Visit: Payer: Medicare Other | Attending: Dermatology | Admitting: Dermatology

## 2022-04-12 ENCOUNTER — Encounter: Payer: Self-pay | Admitting: Dermatology

## 2022-04-12 VITALS — BP 190/99 | HR 78 | Temp 97.0°F | Resp 17 | Ht 74.0 in | Wt 190.0 lb

## 2022-04-12 DIAGNOSIS — Z85820 Personal history of malignant melanoma of skin: Secondary | ICD-10-CM | POA: Insufficient documentation

## 2022-04-12 DIAGNOSIS — Z1283 Encounter for screening for malignant neoplasm of skin: Secondary | ICD-10-CM | POA: Insufficient documentation

## 2022-04-12 NOTE — Progress Notes (Signed)
[facility]    CC: Total body skin examination (TBSE)    HISTORY OF PRESENT ILLNESS  Interval-  Presents today for Total body skin examination (TBSE). Today patient denies any new, changing, itching, peeling, bleeding, painful lesions. Denies enlarged or tender lymph nodes. Feels well overall.    Last TBSE was with Dr.Green ~6 months ago, at which time no suspicious lesions were identified warranting biopsy.     Detailed past-  Frederick [name] is 76 y.o. male with history of melanoma of right posterior neck. Patient's wife noted irregular brown lesion on right posterior neck prompting biopsy of the lesion with dermatology.    Oncology History   Malignant melanoma of neck   04/09/2021 Initial Diagnosis    Malignant melanoma of neck     04/09/2021 Biopsy    Biopsy of  right posterior neck showed 0.3 mm thick, non-ulcerated melanoma with 0 mitoses/mm2. Margins were involved by in situ only.        04/29/2021 Procedure    WLE with 1 cm margins with Dr. Ethelene Browns.         Patient denies any other personal history of melanoma. History of BCC right forehead s/p excision. At time of melanoma WLE, a BCC on the right postauricular was excised by Dr. Imogene Burn.     States history of moderate outdoor sun exposure. Denies tanning bed use.   Patient denies any family history of melanoma or pancreatic cancer.     Retired Oncologist on [employer]  Current Outpatient Medications   Medication Sig Dispense Refill    amLODIPine (NORVASC) 5 MG tablet TAKE 1 TABLET DAILY 90 tablet 1    atorvastatin (LIPITOR) 20 MG tablet TAKE 1 TABLET NIGHTLY 90 tablet 1    Cholecalciferol (Vitamin D3) 50 MCG (2000 UT) Tab Take by mouth daily      eszopiclone (LUNESTA) 2 MG tablet TAKE 1 TABLET NIGHTLY      IMMEDIATELY BEFORE BEDTIME 90 tablet 1    losartan (COZAAR) 100 MG tablet Take 1 tablet (100 mg) by mouth daily 90 tablet 3     No current facility-administered medications for this visit.     No Known Allergies  Past  Medical History:   Diagnosis Date    Diverticulitis     Elevated coronary artery calcium score 01/2020    H/O ABNORMAL CORONARY CALCIUM SCORE. 01/22/20 CARD NOTE FROM DR. BONTEMPO IN EPIC. 02/15/20 ECHO RESULTS PENDING IN EPIC.     Hearing loss     wears hearing aids, about 20% bilaterally     Heart murmur 01/22/2020    basal systolic murmur - ASYMPTOMATIC.    Hyperacusis of both ears     participating in audiotherapy     Hyperlipidemia     MANAGED WITH MEDS.    Hypertension      MANAGED WITH MEDS. H/O WHITE COAT SYNDROME.     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    Low back pain     HX    Melanoma 2022    Back of neck, removed by Dr. Francee Nodal (Derm)    Tinnitus 1993     Past Surgical History:   Procedure Laterality Date    APPENDECTOMY (OPEN)  1973    COLONOSCOPY  02/2011    COLONOSCOPY N/A 08/24/2016    Procedure: COLONOSCOPY;  Surgeon: Lestine Mount, MD;  Location: [facility];  Service: Gastroenterology;  Laterality: N/A;  COLONOSCOPY    EXCISION, CYST  12/2006    back    KNEE ARTHROSCOPY Left 09/2011    LAMINECTOMY, POSTERIOR LUMBAR, DECOMPRESSION, LEVEL 2 Bilateral 03/06/2020    Procedure: BILATERAL L2-L4 LAMINECTOMY;  Surgeon: Jerilynn Mages, MD;  Location: [facility];  Service: Neurosurgery;  Laterality: Bilateral;  BILATERAL L2-L4 LAMINECTOMIES    RELEASE, 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   Vaping Use    Vaping Use: Never used   Substance and Sexual Activity    Alcohol use: Yes     Alcohol/week: 7.0 - 14.0 standard drinks of  alcohol     Types: 7 - 14 Standard drinks or equivalent per week     Comment: 2 glasses per day    Drug use: Yes     Types: Marijuana     Social Determinants of Health     Financial Resource Strain: Low Risk  (04/09/2022)    Overall Financial Resource Strain (CARDIA)     Difficulty of Paying Living Expenses: Not hard at all   Food Insecurity: No Food Insecurity (04/09/2022)    Hunger Vital Sign     Worried About Running Out of Food in the Last Year: Never true     Ran Out of Food in the Last Year: Never true   Transportation Needs: No Transportation Needs (04/09/2022)    PRAPARE - Therapist, art (Medical): No     Lack of Transportation (Non-Medical): No   Physical Activity: Insufficiently Active (04/09/2022)    Exercise Vital Sign     Days of Exercise per Week: 4 days     Minutes of Exercise per Session: 30 min   Stress: No Stress Concern Present (04/09/2022)    [employer] of Occupational Health - Occupational Stress Questionnaire     Feeling of Stress : Not at all   Social Connections: Moderately Isolated (04/09/2022)    Social Connection and Isolation Panel [NHANES]     Frequency of Communication with Friends and Family: More than three times a week     Frequency of Social Gatherings with Friends and Family: Once a week     Attends Religious Services: Never     Database administrator or Organizations: No     Attends Banker Meetings: Never     Marital Status: Married   Catering manager Violence: Not At Risk (04/09/2022)    Humiliation, Afraid, Rape, and Kick questionnaire     Fear of Current or Ex-Partner: No     Emotionally Abused: No     Physically Abused: No     Sexually Abused: No   Housing Stability: Low Risk  (04/09/2022)    Housing Stability Vital Sign     Unable to Pay for Housing in the Last Year: No     Number of Places Lived in the Last Year: 1  Unstable Housing in the Last Year: No       REVIEW OF SYSTEMS  Const - no fever no chills   Resp- no cough   Neuro -  no headache   Abd - no pain   Integ - see HPI    All other systems reviewed and negative      Objective:     Vitals:    04/12/22 1405   BP: (!) 190/99   Pulse: 78   Resp: 17   Temp: 97 F (36.1 C)   SpO2: 100%        General appearance: NAD, conversant.A total body skin examination is completed including palpation of scalp and inspection of hair of scalp, eyebrows, face, chest and extremities and inspection of eccrine and apocrine glands.Dermatoscopy was used to evaluate the nevi.General skin: multiple brown macules, size 2-165mm, < 30 nevi, distributed primarily on the trunk. Extensive photodamage and rhytids. The following anatomic skin sites are examined with findings recorded:  Head including face-   Right postauricular skin just over the mastoid prominence- well healed linear scar,no evidence of recurrence (BCC excised October 2022)  Right forehead- scar, no evidence of recurrence (site of BCC)   Neck-   Right posterior neck- well healed "V" shaped scar, no evidence of recurrence (Stage IA melanoma excised October 2022)  Chest including breasts, and axillae- no lesions of concern  Abdomen- no lesions of concern  Genitalia, groin, buttocks- no lesions of concern  Back- no lesions of concern  Right upper extremity- no lesions of concern  Left upper extremity-no lesions of concern  Right lower extremity- no lesions of concern  Left lower extremity- no lesions of concern  Examination of lymph node basins is completed, including the H and N, axilla and groin. There is no lymphadenopathy.      Pathology reports   Patient Name: Debria GarretMichael D. [name]  Date of Birth: [birth year]  Accession #: [id number]  Date of Service: 04/09/2021     Diagnosis  Neck, right posterior:  Malignant melanoma, superficial spreading type, associated with a minute dermal nevus     Specimen type: Shave biopsy  Clark's level: III (3)  Approximate maximum thickness: 0.3 mm  Growth phase: Vertical  Mitotic rate: 0 per mm^2  Ulceration: Not  identified  Peripheral margin: Involved by situ  Deep margin: Involved by situ via adnexa  Regression: Not identified  Lymphovascular invasion: Not identified  Perineural invasion: Not identified  Microscopic satellites: Not identified  Lymphocytic host response: Not identified  Pathologic stage: pT1a (pT AJCC 8th Ed.) (dtt)     Gross  Irregular, 7x4 mm, tan granular surface, inked. Bisected, totally submitted. (Aa)      Assessment & Plan:     1. Melanoma of right posterior neck s/p WLE October 2022 with Dr. Ethelene BrownsJenny Chen  -No evidence of recurrence on exam  -Signs and symptoms of recurrence reviewed and when to call office    2. 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 Multiple benign nevi  -Reassured patient of benign appearance    4. Skin cancer screening  - total body skin examination performed today    - no other lesions concerning to warrant biopsy today    - Sun protection reviewed    - ABCDEs of melanoma reviewed    - Monthly self-skin examination was advised and the patient was instructed to return to clinic prior to scheduled visit if any suspicious or  worrisome findings are discovered    5. Safe sun practices reviewed including:  -Use of sunscreen and application, need for reapplication  -Sun protective clothing including hats, sunglasses and other    6. Longitudinal follow up  -TBSE every 3-4 months x 2 years (until September 2024), then every 6 months x 3 years (until September 2027). Patient may alternate with Dr. Chilton Si (January 2024, May 2024) and Dr. Wandra Arthurs (annually- due September 2024).    7. Elevated blood pressure  -Asymptomatic in clinic   -Per patient he tracks his blood pressure at home and has normal readings  -Can follow with PCP    Jeannette Corpus, DNP, FNP-BC  Nurse Practitioner, [facility]  [facility]      Suraj S. Ruel Favors, MD, FAAD  Medical Director, [facility]  [facility],  [facility]  Associate Professor of Medicine, [employer]  [address] [place], [place] [address]  T [phone]  F [phone]  [email or web address]    CC: Dr. Francee Nodal (Derm)

## 2022-04-20 ENCOUNTER — Encounter: Payer: Self-pay | Admitting: Neurology

## 2022-04-20 ENCOUNTER — Ambulatory Visit: Payer: Medicare HMO | Admitting: Neurology

## 2022-04-20 VITALS — BP 115/60 | HR 60 | Ht 69.0 in | Wt 179.0 lb

## 2022-04-20 DIAGNOSIS — R51 Headache with orthostatic component, not elsewhere classified: Secondary | ICD-10-CM | POA: Diagnosis not present

## 2022-04-20 DIAGNOSIS — G4484 Primary exertional headache: Secondary | ICD-10-CM | POA: Diagnosis not present

## 2022-04-20 DIAGNOSIS — R519 Headache, unspecified: Secondary | ICD-10-CM

## 2022-04-20 DIAGNOSIS — G44201 Tension-type headache, unspecified, intractable: Secondary | ICD-10-CM | POA: Diagnosis not present

## 2022-04-20 NOTE — Patient Instructions (Addendum)
MRI brain w/wo contrast Blood work  General Headache Without Cause A headache is pain or discomfort felt around the head or neck area. There are many causes and types of headaches. A few common types include: Tension headaches. Migraine headaches. Cluster headaches. Chronic daily headaches. Sometimes, the specific cause of a headache may not be found. Follow these instructions at home: Watch your condition for any changes. Let your health care provider know about them. Take these steps to help with your condition: Managing pain     Take over-the-counter and prescription medicines only as told by your health care provider. Treatment may include medicines for pain that are taken by mouth or applied to the skin. Lie down in a dark, quiet room when you have a headache. Keep lights dim if bright lights bother you or make your headaches worse. If directed, put ice on your head and neck area: Put ice in a plastic bag. Place a towel between your skin and the bag. Leave the ice on for 20 minutes, 2-3 times per day. Remove the ice if your skin turns bright red. This is very important. If you cannot feel pain, heat, or cold, you have a greater risk of damage to the area. If directed, apply heat to the affected area. Use the heat source that your health care provider recommends, such as a moist heat pack or a heating pad. Place a towel between your skin and the heat source. Leave the heat on for 20-30 minutes. Remove the heat if your skin turns bright red. This is especially important if you are unable to feel pain, heat, or cold. You have a greater risk of getting burned. Eating and drinking Eat meals on a regular schedule. If you drink alcohol: Limit how much you have to: 0-1 drink a day for women who are not pregnant. 0-2 drinks a day for men. Know how much alcohol is in a drink. In the [place] one drink equals one 12 oz bottle of beer (355 mL), one 5 oz glass of wine (148 mL), or one 1 oz  glass of hard liquor (44 mL). Stop drinking caffeine, or decrease the amount of caffeine you drink. Drink enough fluid to keep your urine pale yellow. General instructions  Keep a headache journal to help find out what may trigger your headaches. For example, write down: What you eat and drink. How much sleep you get. Any change to your diet or medicines. Try massage or other relaxation techniques. Limit stress. Sit up straight, and do not tense your muscles. Do not use any products that contain nicotine or tobacco. These products include cigarettes, chewing tobacco, and vaping devices, such as e-cigarettes. If you need help quitting, ask your health care provider. Exercise regularly as told by your health care provider. Sleep on a regular schedule. Get 7-9 hours of sleep each night, or the amount recommended by your health care provider. Keep all follow-up visits. This is important. Contact a health care provider if: Medicine does not help your symptoms. You have a headache that is different from your usual headache. You have nausea or you vomit. You have a fever. Get help right away if: Your headache: Becomes severe quickly. Gets worse after moderate to intense physical activity. You have any of these symptoms: Repeated vomiting. Pain or stiffness in your neck. Changes to your vision. Pain in an eye or ear. Problems with speech. Muscular weakness or loss of muscle control. Loss of balance or coordination. You feel faint or  pass out. You have confusion. You have a seizure. These symptoms may represent a serious problem that is an emergency. Do not wait to see if the symptoms will go away. Get medical help right away. Call your local emergency services (911 in the [place] Do not drive yourself to the hospital. Summary A headache is pain or discomfort felt around the head or neck area. There are many causes and types of headaches. In some cases, the cause may not be found. Keep  a headache journal to help find out what may trigger your headaches. Watch your condition for any changes. Let your health care provider know about them. Contact a health care provider if you have a headache that is different from the usual headache, or if your symptoms are not helped by medicine. Get help right away if your headache becomes severe, you vomit, you have a loss of vision, you lose your balance, or you have a seizure. This information is not intended to replace advice given to you by your health care provider. Make sure you discuss any questions you have with your health care provider. Document Revised: 12/10/2020 Document Reviewed: 12/10/2020 Elsevier Patient Education  2023 [employer].

## 2022-04-20 NOTE — Progress Notes (Signed)
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  I reviewed Dr. Sheldon Silvan notes, patient has a headache, history of retinitis pigmentosa, possibly intracranial hypertension or medication overuse headache, he has "sinus headaches" that flareup and sinus congestion but this time is lasted for 6 weeks despite 2 rounds of antibiotics and prednisone not improved when bending over and worsened, tried to wean him off Tylenol.  Headache started 5 weeks ago, majority in the frontal area bilaterally forehead and both temples, worse with bending over, had eyes checked last week no etiology, wife is here and also provides information, at the time the headache started he got overheated and dehydrated and it got away from him and he felt weak and blood pressure systolic was in the 24O. They started not too long after that. More pressure in the forehead. He cannot see light. Daily. Continuous. Better when laying down, in the morning they are nit there until he starts moving around then all day until he goes to bed. Ibuprofen helps. Not migrainous. More of a constant. Dull headache. No fall before the headache started. Steroids did not help at all. Feels like a sinus infection. No jaw pain, no neck pain, no fevers. No  focal weakness. Positional headache. The headaches did not coincide with any changes in medication. No other focal neurologic deficits, associated symptoms, inciting events or modifiable factors.  Reviewed notes, labs and imaging from outside physicians, which showed:   04/07/2022: CT MAXILLOFACIAL WITHOUT CONTRAST reviewed images and agree   TECHNIQUE: Multidetector CT imaging of the maxillofacial structures was performed. Multiplanar CT image reconstructions were also generated.   RADIATION DOSE REDUCTION: This exam was performed according to the departmental dose-optimization program which includes automated exposure control, adjustment of the mA and/or kV according to patient size and/or use of iterative reconstruction technique.   COMPARISON:  None Available.   FINDINGS: Osseous: No fracture or mandibular dislocation. No destructive process.   Orbits: Negative. No traumatic or inflammatory finding.   Sinuses: Opacified right sphenoethmoidal recess. Otherwise, clear sinuses.   Soft tissues: Negative.   Limited intracranial: No significant or unexpected finding.   IMPRESSION: Essentially clear sinuses.    Review of Systems: Patient complains of symptoms per HPI as well as the following symptoms headache. Pertinent negatives and positives per HPI. All others negative.   Social History   Socioeconomic History   Marital status: Married    Spouse name: Not on file   Number of children: Not on file   Years of education: Not on file   Highest education level: Not on file  Occupational History   Not on file  Tobacco Use   Smoking status: Former    Packs/day: 1.00    Years: 6.00    Total pack years: 6.00    Types: Cigarettes    Quit date: 30  Years since quitting: 51.7   Smokeless tobacco: Never  Vaping Use   Vaping Use: Never used  Substance and Sexual Activity   Alcohol use: Not Currently    Alcohol/week: 2.0 standard drinks of alcohol    Types: 2 Glasses  of wine per week   Drug use: No   Sexual activity: Not on file  Other Topics Concern   Not on file  Social History Narrative   Not on file   Social Determinants of Health   Financial Resource Strain: Not on file  Food Insecurity: Not on file  Transportation Needs: Not on file  Physical Activity: Not on file  Stress: Not on file  Social Connections: Not on file  Intimate Partner Violence: Not on file    Family History  Problem Relation Age of Onset   Colon cancer Neg Hx    Pancreatic cancer Neg Hx    Stomach cancer Neg Hx    Rectal cancer Neg Hx    Migraines Neg Hx    Headache Neg Hx     Past Medical History:  Diagnosis Date   Blind    Concussion 2016   no residual   Hypertension    Hypothyroidism    Retinitis pigmentosa    blind    Patient Active Problem List   Diagnosis Date Noted   Frontal headache 04/20/2022   New onset of headaches after age 70 04/20/2022    Past Surgical History:  Procedure Laterality Date   EYE SURGERY  1979   cataracts both eyes   HYDROCELE EXCISION Right 2007   HYDROCELE EXCISION Left 03/12/2020   Procedure: LEFT HYDROCELECTOMY ADULT;  Surgeon: Crista Elliot, MD;  Location: [facility];  Service: Urology;  Laterality: Left;   left hydrocelectomy  02/01/2020   at [facility] surgical center   NASAL SINUS SURGERY  03/14/14   neuromal foot  1991   right   torn cartilage right knee  1988    Current Outpatient Medications  Medication Sig Dispense Refill   amLODipine (NORVASC) 10 MG tablet Take 10 mg by mouth daily.     atorvastatin (LIPITOR) 20 MG tablet Take 20 mg by mouth daily.      diphenhydrAMINE (BENADRYL) 25 MG tablet Take 25 mg by mouth at bedtime as needed for allergies or sleep.     levothyroxine (SYNTHROID, LEVOTHROID) 75 MCG tablet Take 75 mcg by mouth daily before breakfast.     predniSONE (DELTASONE) 1 MG tablet Take 2 mg by mouth as needed (sinus).     aspirin EC 81 MG tablet Take 81 mg by mouth  daily. Swallow whole.     cetirizine (ZYRTEC) 10 MG tablet Take 10 mg by mouth daily as needed for allergies.     famotidine (PEPCID) 20 MG tablet Take 20 mg by mouth 2 (two) times daily.      HYDROcodone-acetaminophen (NORCO/VICODIN) 5-325 MG tablet Take 1 tablet by mouth every 6 (six) hours as needed for moderate pain.     sulfamethoxazole-trimethoprim (BACTRIM DS) 800-160 MG tablet Take 1 tablet by mouth 2 (two) times daily.     No current facility-administered medications for this visit.    Allergies as of 04/20/2022   (No Known Allergies)    Vitals: BP 115/60   Pulse 60   Ht 5\' 9"  (1.753 m)   Wt 179 lb (81.2 kg)   BMI 26.43 kg/m  Last Weight:  Wt Readings from Last 1 Encounters:  04/20/22 179 lb (81.2  kg)   Last Height:   Ht Readings from Last 1 Encounters:  04/20/22 5\' 9"  (1.753 m)     Physical exam: Exam: Gen: NAD, conversant, well nourised, well groomed                     CV: RRR, no MRG. No Carotid Bruits. No peripheral edema, warm, nontender Eyes: Conjunctivae clear without exudates or hemorrhage  Neuro: Detailed Neurologic Exam  Speech:    Speech is normal; fluent and spontaneous with normal comprehension.  Cognition:    The patient is oriented to person, place, and time;     recent and remote memory intact;     language fluent;     normal attention, concentration,     fund of knowledge Cranial Nerves:    Retinosa pigmentosa, pupils pin point and unreactive, attempted cannot visualize fundi due to small pupils can see minimal light(blind) Extraocular movements are intact. Trigeminal sensation is intact and the muscles of mastication are normal. The face is symmetric. The palate elevates in the midline. Hearing intact. Voice is normal. Shoulder shrug is normal. The tongue has normal motion without fasciculations.   Coordination:    Normal   Gait: normal   Motor Observation:    No asymmetry, no atrophy, and no involuntary movements noted. Tone:     Normal muscle tone.    Posture:    Posture is normal. normal erect    Strength:    Strength is intact, no focal deficits noted     Sensation: intact to LT     Reflex Exam:  DTR's:    Deep tendon reflexes in the upper and lower extremities are symmetrical bilaterally.   Toes:    The toes are downgoing bilaterally.   Clonus:    Clonus is absent.    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 Headache is tension-type but given concerning symptoms especially positional quality and new onset after 49 need imaging.  MRI brain w/wo contrast: MRI brain due to concerning symptoms of intractable headache, positional and exertional headaches,worsening headaches  to look for space occupying mass, chiari or intracranial hypertension (pseudotumor), strokes, malignancies, vasculidities, demyelination(multiple sclerosis) or other  Blood work  Orders Placed This Encounter  Procedures   MR BRAIN W WO CONTRAST   Comprehensive metabolic panel   CBC with Differential/Platelets   TSH Rfx on Abnormal to Free T4   Sedimentation rate   C-reactive protein     Cc: September, DO,  Charlane Ferretti, DO  Charlane Ferretti, MD  [facility] [address] [place], [place]  Phone [phone] Fax [phone]

## 2022-04-21 ENCOUNTER — Telehealth: Payer: Self-pay | Admitting: Neurology

## 2022-04-21 LAB — COMPREHENSIVE METABOLIC PANEL
ALT: 16 IU/L (ref 0–44)
AST: 16 IU/L (ref 0–40)
Albumin/Globulin Ratio: 1.9 (ref 1.2–2.2)
Albumin: 4.2 g/dL (ref 3.8–4.8)
Alkaline Phosphatase: 131 IU/L — ABNORMAL HIGH (ref 44–121)
BUN/Creatinine Ratio: 23 (ref 10–24)
BUN: 26 mg/dL (ref 8–27)
Bilirubin Total: 0.6 mg/dL (ref 0.0–1.2)
CO2: 25 mmol/L (ref 20–29)
Calcium: 9.5 mg/dL (ref 8.6–10.2)
Chloride: 102 mmol/L (ref 96–106)
Creatinine, Ser: 1.13 mg/dL (ref 0.76–1.27)
Globulin, Total: 2.2 g/dL (ref 1.5–4.5)
Glucose: 77 mg/dL (ref 70–99)
Potassium: 3.9 mmol/L (ref 3.5–5.2)
Sodium: 143 mmol/L (ref 134–144)
Total Protein: 6.4 g/dL (ref 6.0–8.5)
eGFR: 67 mL/min/{1.73_m2} (ref >59)

## 2022-04-21 LAB — CBC WITH DIFFERENTIAL/PLATELET
Basophils Absolute: 0.1 10*3/uL (ref 0.0–0.2)
Basos: 1 %
EOS (ABSOLUTE): 0.2 10*3/uL (ref 0.0–0.4)
Eos: 2 %
Hematocrit: 38.4 % (ref 37.5–51.0)
Hemoglobin: 13 g/dL (ref 13.0–17.7)
Immature Grans (Abs): 0 10*3/uL (ref 0.0–0.1)
Immature Granulocytes: 0 %
Lymphocytes Absolute: 1.9 10*3/uL (ref 0.7–3.1)
Lymphs: 21 %
MCH: 32 pg (ref 26.6–33.0)
MCHC: 33.9 g/dL (ref 31.5–35.7)
MCV: 95 fL (ref 79–97)
Monocytes Absolute: 0.8 10*3/uL (ref 0.1–0.9)
Monocytes: 9 %
Neutrophils Absolute: 6.1 10*3/uL (ref 1.4–7.0)
Neutrophils: 67 %
Platelets: 216 10*3/uL (ref 150–450)
RBC: 4.06 x10E6/uL — ABNORMAL LOW (ref 4.14–5.80)
RDW: 12 % (ref 11.6–15.4)
WBC: 9 10*3/uL (ref 3.4–10.8)

## 2022-04-21 LAB — C-REACTIVE PROTEIN: CRP: 2 mg/L (ref 0–10)

## 2022-04-21 LAB — TSH RFX ON ABNORMAL TO FREE T4: TSH: 3.45 u[IU]/mL (ref 0.450–4.500)

## 2022-04-21 LAB — SEDIMENTATION RATE: Sed Rate: 2 mm/hr (ref 0–30)

## 2022-04-21 NOTE — Telephone Encounter (Signed)
[employer] medicare sent to [facility] they obtain auth  

## 2022-04-26 ENCOUNTER — Encounter (FREE_STANDING_LABORATORY_FACILITY): Payer: Medicare Other

## 2022-04-26 ENCOUNTER — Ambulatory Visit: Payer: Self-pay

## 2022-04-26 DIAGNOSIS — K635 Polyp of colon: Secondary | ICD-10-CM

## 2022-04-26 DIAGNOSIS — K5730 Diverticulosis of large intestine without perforation or abscess without bleeding: Secondary | ICD-10-CM

## 2022-04-26 DIAGNOSIS — Z86010 Personal history of colonic polyps: Secondary | ICD-10-CM

## 2022-04-26 LAB — SURGICAL PATHOLOGY EXAM

## 2022-04-27 ENCOUNTER — Ambulatory Visit (INDEPENDENT_AMBULATORY_CARE_PROVIDER_SITE_OTHER): Payer: Medicare Other | Admitting: Cardiovascular Disease

## 2022-04-27 ENCOUNTER — Encounter (INDEPENDENT_AMBULATORY_CARE_PROVIDER_SITE_OTHER): Payer: Self-pay | Admitting: Cardiovascular Disease

## 2022-04-27 VITALS — BP 171/85 | HR 69 | Ht 74.0 in | Wt 182.0 lb

## 2022-04-27 DIAGNOSIS — E785 Hyperlipidemia, unspecified: Secondary | ICD-10-CM

## 2022-04-27 DIAGNOSIS — R931 Abnormal findings on diagnostic imaging of heart and coronary circulation: Secondary | ICD-10-CM

## 2022-04-27 DIAGNOSIS — I2510 Atherosclerotic heart disease of native coronary artery without angina pectoris: Secondary | ICD-10-CM

## 2022-04-27 DIAGNOSIS — I10 Essential (primary) hypertension: Secondary | ICD-10-CM

## 2022-04-27 LAB — ECG 12-LEAD
Atrial Rate: 62 {beats}/min
IHS MUSE NARRATIVE AND IMPRESSION: NORMAL
P Axis: 11 degrees
P-R Interval: 152 ms
Q-T Interval: 414 ms
QRS Duration: 80 ms
QTC Calculation (Bezet): 420 ms
R Axis: 47 degrees
T Axis: 12 degrees
Ventricular Rate: 62 {beats}/min

## 2022-04-27 MED ORDER — ASPIRIN 81 MG PO TBEC
81.0000 mg | DELAYED_RELEASE_TABLET | Freq: Every day | ORAL | Status: DC
Start: 2022-04-27 — End: 2023-08-29

## 2022-04-27 NOTE — Progress Notes (Signed)
[facility]     Chief Complaint   Patient presents with    Heart Murmur     Establish care with a new cardiologist.     Assessment and Plan     1.  Elevated coronary calcium score / Coronary artery disease   Elevated coronary calcium score at 654 (02/19/2020)   Asymptomatic   ECG: NSR , WNL   Pt has significantly CV risk factors but has been asymptomatic   - Add ASA  81 mg po qd  - continue Atorvastatin and Losartan   - Lexiscan Nuclear Stress Test prior to next visit. Pt is unable to exercise on treadmill with high angle due to back pain     2. Essential hypertension  Elevated today but pt reports BP within normal limits when checked at home, but elevated when in doctor's office.   -Pt was instructed to check BP twice a day , keep a record and bring the BP machine at the next visit to compare the readings.    - continue amlodipine and losartan at current dosages for now.     3. Hyperlipidemia  LDL normal   - continue Atorvastatin 20 mg po qd.     Previous Cardiovascular Testing   Echo  02/19/2020: nl LV size , mildly increased LV wall thickness, LVEF 62%, mild AI, RVSP  25 mmHg. Nl RV size and RVSF.   Lexiscan perfusion study  01/25/2020: LVEF 59% , no ischemia .     Coronary calcium score   12/20/2019:   Left Main:  117  Left Anterior Descending:  409  Left Circumflex:           83  Right Coronary:            45  Posterior Descending:      0  TOTAL CAC SCORE:       654    History of Present Illness   76 y.o.male with a PMH of diverticulitis, elevated coronary artery calcium score  01/2020, HTN, HLP,hyperacusis, Melanoma s/p surgical resection, who presents for re-establishing care .     Pt was previously seen by Dr. Clinton Sawyer in 2021.  Today , he states that he has been felling well since the previous visit except the Hyperacusis problem, currently followed by his audiologist. He denies any CP, SOB, palpitations, dizziness, LH or leg swelling. His took all BP meds in the evening and his BP is usually normal during  daytime with at 120s/60s-70s mmHg, although may be mildly elevated in the early AM with SBP up to 140s mmHg. No HA or blurry vision.  He has been physically actively and has been exercising a few times a week in the home gym.     Past Medical History     Past Medical History:   Diagnosis Date    Diverticulitis     Elevated coronary artery calcium score 01/2020    H/O ABNORMAL CORONARY CALCIUM SCORE. 01/22/20 CARD NOTE FROM DR. BONTEMPO IN EPIC. 02/15/20 ECHO RESULTS PENDING IN EPIC.     Hearing loss     wears hearing aids, about 20% bilaterally     Heart murmur 01/22/2020    basal systolic murmur - ASYMPTOMATIC.    Hyperacusis of both ears     participating in audiotherapy     Hyperlipidemia     MANAGED WITH MEDS.    Hypertension      MANAGED WITH MEDS. H/O WHITE COAT SYNDROME.     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 Francee Nodal (Derm)    Tinnitus 1993     Past Surgical History     Past Surgical History:   Procedure Laterality Date    APPENDECTOMY (OPEN)  1973    COLONOSCOPY  02/2011    COLONOSCOPY N/A 08/24/2016    Procedure: COLONOSCOPY;  Surgeon: Lestine Mount, MD;  Location: [id number];  Service: Gastroenterology;  Laterality: N/A;  COLONOSCOPY    EXCISION, CYST  12/2006    back    KNEE ARTHROSCOPY Left 09/2011    LAMINECTOMY, POSTERIOR LUMBAR, DECOMPRESSION, LEVEL 2 Bilateral 03/06/2020    Procedure: BILATERAL L2-L4 LAMINECTOMY;  Surgeon: Jerilynn Mages, MD;  Location: [facility];  Service: Neurosurgery;  Laterality: Bilateral;  BILATERAL L2-L4 LAMINECTOMIES    RELEASE, 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   Vaping Use    Vaping  Use: Never used   Substance Use Topics    Alcohol use: Yes     Alcohol/week: 7.0 - 14.0 standard drinks of alcohol     Types: 7 - 14 Standard drinks or equivalent per week     Comment: 2 glasses per day    Drug use: Yes     Types: Marijuana     Family History     Family History   Problem Relation Age of Onset    Alzheimer's disease Mother     Heart attack Father     Stroke Paternal Grandfather      Allergies     No Known Allergies    Medications     Current Outpatient Medications   Medication Sig Dispense Refill    amLODIPine (NORVASC) 5 MG tablet TAKE 1 TABLET DAILY 90 tablet 1    atorvastatin (LIPITOR) 20 MG tablet TAKE 1 TABLET NIGHTLY 90 tablet 1    Cholecalciferol (Vitamin D3) 50 MCG (2000 UT) Tab Take by mouth daily      eszopiclone (LUNESTA) 2 MG tablet TAKE 1 TABLET NIGHTLY      IMMEDIATELY BEFORE BEDTIME 90 tablet 1    losartan (COZAAR) 100 MG tablet Take 1 tablet (100 mg) by mouth daily 90 tablet 3    aspirin EC 81 MG EC tablet Take 1 tablet (81 mg) by mouth daily       No current facility-administered medications for this visit.     Review of Systems     General: Not Present- Fatigue, Weight Gain and Weight Loss.  Respiratory: Not Present- Bloody sputum, Cough, and Wheezing.  Cardiovascular: Not Present- Calf, thigh or buttock pain with walking, Chest Pain, Difficulty Breathing Lying Down, Dyspnea, Edema, Awakening Short of Breath, and Palpitations.  Gastrointestinal: Not Present- Bloody Stool, Constipation, Diarrhea, Hematemesis, Indigestion, Nausea and Vomiting.  Neurological: Not Present- Dizziness, Syncope and Weakness.    Physical Exam     Vitals:    04/27/22 0830   BP: 171/85   BP Site: Left arm   Patient Position: Sitting   Cuff Size: Medium  Pulse: 69   SpO2: 98%   Weight: 82.6 kg (182 lb)   Height: 1.88 m (6\' 2" )     Body mass index is 23.37 kg/m.    General:  Patient appears their stated age, well-nourished.  Alert and in no apparent distress.  Eyes: No conjunctivitis, no purulent discharge,  no lid lag  ENT:  Hearing grossly intact, Nares patent bilaterally, Lips moist, color appropriate for race.  Respiratory: Clear to auscultation throughout. Respiratory effort unlabored, chest expansion symmetric.    Cardio: Regular rate and rhythm. Normal S1/S2, 2/6 SEM at the RUSB and LMSB, early peaking, no R/G.  No carotid bruits, no JVD.  Extremities: warm, pulses 2+ dp b/l, no peripheral edema  GI: Soft, nondistended, nontender.  No guarding or rebound.  Skin: Color appropriate for race, Skin warm, dry, and intact  Psychiatric: Good insight and judgment, oriented to person, place, and time    Labs   12/17/2021: TSH  2.48, LFT normal except T. Bili 2.2   Lipid Panel   Cholesterol   Date/Time Value Ref Range Status   12/17/2021 09:09 AM 154 0 - 199 mg/dL Final     Triglycerides   Date/Time Value Ref Range Status   12/17/2021 09:09 AM 75 34 - 149 mg/dL Final     HDL   Date/Time Value Ref Range Status   12/17/2021 09:09 AM 56 40 - 9,999 mg/dL Final     Comment:     An HDL cholesterol <40 mg/dL is low and constitutes a  coronary heart disease risk factor, and HDL-C>59 mg/dL is  a negative risk factor for CHD.  Ref: American Heart Association; Circulation 2004     LDL  83 (12/17/2021)   CBC   Lab Results   Component Value Date    WBC 5.50 12/17/2021    HGB 15.4 12/17/2021    HCT 45.8 12/17/2021    MCV 94.4 12/17/2021    PLT 246 12/17/2021      BMP:   Lab Results   Component Value Date    NA 141 12/17/2021    K 4.1 12/17/2021    CL 107 12/17/2021    CL 104 08/09/2013    CO2 25 12/17/2021    BUN 12.0 12/17/2021    CREAT 0.8 12/17/2021    CREAT 0.89 08/09/2013    GLU 108 (H) 12/17/2021    CA 8.8 12/17/2021     INR No results found for: "INR", "PROTIME"     EKG   ECG 04/27/2022: NSR, WNL.     Follow-Up     Return in about 1 year (around 04/28/2023).    Warmest Regards,    Kelli Hope, MD PhD  [facility]

## 2022-04-27 NOTE — Patient Instructions (Signed)
Pt was instructed to check BP twice a day , keep a record and bring the BP machine at the next visit to compare the readings.  Let Dr. Dion Body know if BP frequently > 130/80 mmHg   Leixiscan perfusion study  before next visit   Add ASA  81 mg po qd.

## 2022-04-29 ENCOUNTER — Ambulatory Visit
Admission: RE | Admit: 2022-04-29 | Discharge: 2022-04-29 | Disposition: A | Discharge status: Home / Self Care | Payer: Medicare HMO | Source: Ambulatory Visit | Attending: Neurology | Admitting: Neurology

## 2022-04-29 DIAGNOSIS — G44201 Tension-type headache, unspecified, intractable: Secondary | ICD-10-CM

## 2022-04-29 DIAGNOSIS — R51 Headache with orthostatic component, not elsewhere classified: Secondary | ICD-10-CM

## 2022-04-29 DIAGNOSIS — R519 Headache, unspecified: Secondary | ICD-10-CM | POA: Diagnosis not present

## 2022-04-29 DIAGNOSIS — G4484 Primary exertional headache: Secondary | ICD-10-CM

## 2022-04-29 MED ORDER — GADOPICLENOL 0.5 MMOL/ML IV SOLN
8.0000 mL | Freq: Once | INTRAVENOUS | Status: AC | PRN
  Administered 2022-04-29: 8 mL via INTRAVENOUS

## 2022-05-03 ENCOUNTER — Telehealth: Payer: Self-pay | Admitting: Neurology

## 2022-05-03 NOTE — Telephone Encounter (Signed)
MRI of the brain normal for age, great news!! Dr. Jaynee Eagles  Written by Melvenia Beam, MD on 05/03/2022  1:10 PM EDT

## 2022-05-03 NOTE — Telephone Encounter (Signed)
Pt is calling. Stated he is following up on MRI results. Pt is requesting a call back from the nurse.  

## 2022-05-03 NOTE — Telephone Encounter (Signed)
I spoke with the patient and let him know his MRI of the brain is normal for his age which is great news.  Patient verbalized understanding.  He still wants to know what can be done about his headaches that he has had every day for 2+ months.  He saw on the results where there is mild chronic inflammation of the paranasal sinuses.  He did have a recent CT facial that showed essentially clear sinuses. He took an antibiotic for a suspected sinus infection but still has pain. He said he feels discomfort in his temples when he bends over. He also asked about the remote age small infarct seen on MRI and what the significance of that is.

## 2022-05-04 NOTE — Telephone Encounter (Signed)
We can set up another appointment to talk about headache medications and management if he likes. He could be seen by me or an NP and it could be a video to discuss the options. thanks

## 2022-05-07 ENCOUNTER — Other Ambulatory Visit: Payer: Medicare HMO

## 2022-05-11 ENCOUNTER — Ambulatory Visit (INDEPENDENT_AMBULATORY_CARE_PROVIDER_SITE_OTHER): Payer: Medicare Other

## 2022-05-11 DIAGNOSIS — T881XXA Other complications following immunization, not elsewhere classified, initial encounter: Secondary | ICD-10-CM

## 2022-05-11 DIAGNOSIS — Z23 Encounter for immunization: Secondary | ICD-10-CM

## 2022-05-11 NOTE — Progress Notes (Signed)
High Dose Flu vaccination was administered IM in Left  deltoid. Detailed information and VIS was given to the patient. Patient left in good condition.

## 2022-05-20 ENCOUNTER — Encounter: Payer: Self-pay | Admitting: Neurology

## 2022-05-20 ENCOUNTER — Telehealth: Payer: Self-pay | Admitting: Neurology

## 2022-05-20 ENCOUNTER — Ambulatory Visit: Payer: Medicare HMO | Admitting: Neurology

## 2022-05-20 VITALS — BP 110/59 | HR 67 | Ht 69.0 in | Wt 181.0 lb

## 2022-05-20 DIAGNOSIS — R519 Headache, unspecified: Secondary | ICD-10-CM

## 2022-05-20 DIAGNOSIS — G4734 Idiopathic sleep related nonobstructive alveolar hypoventilation: Secondary | ICD-10-CM

## 2022-05-20 DIAGNOSIS — R0683 Snoring: Secondary | ICD-10-CM | POA: Diagnosis not present

## 2022-05-20 DIAGNOSIS — I6381 Other cerebral infarction due to occlusion or stenosis of small artery: Secondary | ICD-10-CM | POA: Diagnosis not present

## 2022-05-20 DIAGNOSIS — R0681 Apnea, not elsewhere classified: Secondary | ICD-10-CM

## 2022-05-20 MED ORDER — EMGALITY 120 MG/ML ~~LOC~~ SOAJ: 120.0000 mg | SUBCUTANEOUS | 11 refills | Status: DC

## 2022-05-20 MED ORDER — GALCANEZUMAB-GNLM 120 MG/ML ~~LOC~~ SOAJ
240.0000 mg | Freq: Once | SUBCUTANEOUS | Status: AC
  Administered 2022-05-20: 240 mg via SUBCUTANEOUS

## 2022-05-20 NOTE — Telephone Encounter (Signed)
FINDINGS:  The brain parenchyma shows remote lacunar infarct in the right paramedian pons.  There are scattered bilateral periventricular and subcortical white matter hyperintensities on T2/flair which are age-appropriate changes of chronic small vessel disease.  No structural lesion, tumor or infarct is noted.  Subarachnoid spaces and ventricular system appear normal.  Cortical sulci and gyri show normal appearance.  Extra-axial brain structures appear normal.  Calvarium shows no abnormalities.  Orbits appear unremarkable.  Paranasal sinuses show mild chronic inflammatory changes.  Pituitary gland and cerebellar tonsils appear normal.  Visualized portion of the upper cervical spine show only minor degenerative changes.  Flow-voids of large vessels of intracranial circulation appear to be patent.  Postcontrast images do not result in abnormal areas of enhancement.         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  There were no acute abnormalities

## 2022-05-20 NOTE — Telephone Encounter (Signed)
I called [facility] they were able to relay that amlodipine is 5mg  po qday.  Received 90 day supply last 05-11-2022.

## 2022-05-20 NOTE — Addendum Note (Signed)
Addended by: Gildardo Griffes on: 05/20/2022 08:33 AM   Modules accepted: Orders

## 2022-05-20 NOTE — Telephone Encounter (Signed)
Please sne patient overnight ox monitor he does not wear oxygen for nocturnal hypoxemia and morning headaches

## 2022-05-20 NOTE — Progress Notes (Signed)
Per Dr.Ahern gave Emgality loading dose  injection.(120mg  x2) Gave both injections back of upper right and left arm. Placed bandades on both injection sites. Pt sat for a few minutes in room to make sure no reaction  to medication . Pt states fine but back of arms a little sore. Pt went to check out.

## 2022-05-20 NOTE — Telephone Encounter (Signed)
Austin Carter from [facility] called stating that the pt is already on 5mg  and does not know why in his chart it states that he is on the 10mg . Please advise.

## 2022-05-20 NOTE — Telephone Encounter (Signed)
Patint has very low BP 384 systolic and getting dizzy with headaches standing can try decreaseing amlodpipone to 5mg  from 10mg  and see if it helps? Please call office and check with nurse and let doctor know and if his blood pressure at hoe increase go bk to 10mg 

## 2022-05-20 NOTE — Patient Instructions (Addendum)
LDL < 70 HTN: control it but too low blood pressure  Continue ASA CTA of the head and neck due to stroke Echocardiogram 30-day heart monitor  Sleep study overnight ox monitor he does not wear oxygen for nocturnal hypoxemia and morning headaches Emgality: Start emgality the first month is 2 injections and the one onjection a month for the headaches Other option: Low pressure CSF leak    Lacunar Stroke  A lacunar stroke (lacunar infarction) happens when an area of the brain does not get enough oxygen and blood flow. This can lead to permanent brain damage. A lacunar stroke is a medical emergency. It must be treated right away. What are the causes? This condition is caused by a blockage in a small artery deep in the brain. This may be due to: A buildup of fatty deposits that causes the arteries to narrow (atherosclerosis). This blocks blood flow in the brain. High blood pressure. What increases the risk? You are more likely to develop this condition if you: Have high blood pressure (hypertension). Have high cholesterol (hyperlipidemia). Smoke. Have diabetes. Have heart disease or artery disease, such as carotid artery disease or peripheral artery disease. Are age 77 or older. Have a personal or family history of stroke. What are the signs or symptoms? Symptoms of this condition usually develop suddenly. They may include: Weakness or numbness in your face, arm, or leg, especially on one side of your body. Trouble walking or moving your arms or legs. Loss of balance or coordination. Slurred speech. Vision problems. Dizziness. Nausea and vomiting. Severe headache. How is this diagnosed? This condition may be diagnosed based on: Your symptoms and medical history. A physical exam. Blood tests. A CT scan or MRI of the brain. How is this treated? This condition must be treated within 3-4 hours of the start of the stroke. The goal is to restore blood flow to the brain as soon as  possible. Treatments may include: Medicine given through an IV to dissolve the blood clot. Blood thinners (antiplatelets). Medicines to control blood pressure. Your health care provider may prescribe blood thinners to lower your risk of another stroke. Follow these instructions at home: Medicines Take over-the-counter and prescription medicines only as told by your health care provider. If you were told to take a medicine to thin your blood, such as aspirin, take it exactly as told by your health care provider. Taking too much blood-thinning medicine can cause bleeding. Taking too little may not protect you against a stroke and other problems. Eating and drinking Follow instructions from your health care provider about diet. Eat healthy foods. This includes plenty of fruits and vegetables, lean meats, whole grains, and low-fat dairy products. Avoid foods high in saturated fat, trans fat, or sodium. If you drink alcohol: Limit how much you have to: 0-1 drink a day for women. 0-2 drinks a day for men. Know how much alcohol is in your drink. In the [place] one drink equals 12 oz of beer, 5 oz of wine, or 1 oz of hard liquor. Safety If you need help walking, use a cane or walker as told by your health care provider. Take steps to lower the risk of falls in your home. This may include: Using raised toilets and a seat in the shower. Removing clutter and tripping hazards, such as cords or area rugs. Installing grab bars in the bedroom and bathroom. Activity Exercise regularly, as told by your health care provider. Take part in rehabilitation programs as told by  your health care provider. This may include physical therapy, occupational therapy, or speech therapy. General instructions Do not use any products that contain nicotine or tobacco. These products include cigarettes, chewing tobacco, and vaping devices, such as e-cigarettes. If you need help quitting, ask your health care provider. Keep  all follow-up visits. This is important. Get help right away if:  You have any symptoms of stroke. "BE FAST" is an easy way to remember the main warning signs of stroke: B - Balance. Signs are dizziness, sudden trouble walking, or loss of balance. E - Eyes. Signs are trouble seeing or a sudden change in vision. F - Face. Signs are sudden weakness or numbness of the face, or the face or eyelid drooping on one side. A - Arms. Signs are weakness or numbness in an arm. This happens suddenly and usually on one side of the body. S - Speech. Signs are sudden trouble speaking, slurred speech, or trouble understanding what people say. T - Time. Time to call emergency services. Write down what time symptoms started. You have other signs of stroke, such as: A sudden, severe headache. Nausea or vomiting. Seizure. You have a severe fall or injury. These symptoms may represent a serious problem that is an emergency. Do not wait to see if the symptoms will go away. Get medical help right away. Call your local emergency services (911 in the [place] Do not drive yourself to the hospital. Summary A lacunar stroke (lacunar infarction) happens when an area of the brain does not get enough oxygen. This can lead to permanent brain damage. This condition is a medical emergency that must be treated right away. Treatments must be done within 3-4 hours of the start of the stroke. Controlling your risk factors for stroke is the best way to avoid another lacunar stroke. Get help right away if you have any symptoms of stroke. "BE FAST" is an easy way to remember the main warning signs of stroke. This information is not intended to replace advice given to you by your health care provider. Make sure you discuss any questions you have with your health care provider. Document Revised: 12/23/2021 Document Reviewed: 01/31/2020 Elsevier Patient Education  2023 [employer]  [employer] Injection What is this  medication? GALCANEZUMAB (gal ka NEZ ue mab) prevents migraines. It works by blocking a substance in the body that causes migraines. It may also be used to treat cluster headaches. It is a monoclonal antibody. This medicine may be used for other purposes; ask your health care provider or pharmacist if you have questions. COMMON BRAND NAME(S): Emgality What should I tell my care team before I take this medication? They need to know if you have any of these conditions: An unusual or allergic reaction to galcanezumab, other medications, foods, dyes, or preservatives Pregnant or trying to get pregnant Breast-feeding How should I use this medication? This medication is injected under the skin. You will be taught how to prepare and give it. Take it as directed on the prescription label. Keep taking it unless your care team tells you to stop. It is important that you put your used needles and syringes in a special sharps container. Do not put them in a trash can. If you do not have a sharps container, call your pharmacist or care team to get one. Talk to your care team about the use of this medication in children. Special care may be needed. Overdosage: If you think you have taken too much of this medicine  contact a poison control center or emergency room at once. NOTE: This medicine is only for you. Do not share this medicine with others. What if I miss a dose? If you miss a dose, take it as soon as you can. If it is almost time for your next dose, take only that dose. Do not take double or extra doses. What may interact with this medication? Interactions are not expected. This list may not describe all possible interactions. Give your health care provider a list of all the medicines, herbs, non-prescription drugs, or dietary supplements you use. Also tell them if you smoke, drink alcohol, or use illegal drugs. Some items may interact with your medicine. What should I watch for while using this  medication? Visit your care team for regular checks on your progress. Tell your care team if your symptoms do not start to get better or if they get worse. What side effects may I notice from receiving this medication? Side effects that you should report to your care team as soon as possible: Allergic reactions or angioedema--skin rash, itching or hives, swelling of the face, eyes, lips, tongue, arms, or legs, trouble swallowing or breathing Side effects that usually do not require medical attention (report to your care team if they continue or are bothersome): Pain, redness, or irritation at injection site This list may not describe all possible side effects. Call your doctor for medical advice about side effects. You may report side effects to FDA at 1-800-FDA-1088. Where should I keep my medication? Keep out of the reach of children and pets. Store in a refrigerator or at room temperature between 20 and 25 degrees C (68 and 77 degrees F). Refrigeration (preferred): Store in the refrigerator. Do not freeze. Keep in the original container until you are ready to take it. Remove the dose from the carton about 30 minutes before it is time for you to use it. If the dose is not used, it may be stored in original container at room temperature for 7 days. Get rid of any unused medication after the expiration date. Room Temperature: This medication may be stored at room temperature for up to 7 days. Keep it in the original container. Protect from light until time of use. If it is stored at room temperature, get rid of any unused medication after 7 days or after it expires, whichever is first. To get rid of medications that are no longer needed or have expired: Take the medication to a medication take-back program. Check with your pharmacy or law enforcement to find a location. If you cannot return the medication, ask your pharmacist or care team how to get rid of this medication safely. NOTE: This sheet is  a summary. It may not cover all possible information. If you have questions about this medicine, talk to your doctor, pharmacist, or health care provider.  2023 Elsevier/Gold Standard (2021-07-30 00:00:00)   Galcanezumab Injection What is this medication? GALCANEZUMAB (gal ka NEZ ue mab) prevents migraines. It works by blocking a substance in the body that causes migraines. It may also be used to treat cluster headaches. It is a monoclonal antibody. This medicine may be used for other purposes; ask your health care provider or pharmacist if you have questions. COMMON BRAND NAME(S): Emgality What should I tell my care team before I take this medication? They need to know if you have any of these conditions: An unusual or allergic reaction to galcanezumab, other medications, foods, dyes, or preservatives Pregnant or  trying to get pregnant Breast-feeding How should I use this medication? This medication is injected under the skin. You will be taught how to prepare and give it. Take it as directed on the prescription label. Keep taking it unless your care team tells you to stop. It is important that you put your used needles and syringes in a special sharps container. Do not put them in a trash can. If you do not have a sharps container, call your pharmacist or care team to get one. Talk to your care team about the use of this medication in children. Special care may be needed. Overdosage: If you think you have taken too much of this medicine contact a poison control center or emergency room at once. NOTE: This medicine is only for you. Do not share this medicine with others. What if I miss a dose? If you miss a dose, take it as soon as you can. If it is almost time for your next dose, take only that dose. Do not take double or extra doses. What may interact with this medication? Interactions are not expected. This list may not describe all possible interactions. Give your health care provider a  list of all the medicines, herbs, non-prescription drugs, or dietary supplements you use. Also tell them if you smoke, drink alcohol, or use illegal drugs. Some items may interact with your medicine. What should I watch for while using this medication? Visit your care team for regular checks on your progress. Tell your care team if your symptoms do not start to get better or if they get worse. What side effects may I notice from receiving this medication? Side effects that you should report to your care team as soon as possible: Allergic reactions or angioedema--skin rash, itching or hives, swelling of the face, eyes, lips, tongue, arms, or legs, trouble swallowing or breathing Side effects that usually do not require medical attention (report to your care team if they continue or are bothersome): Pain, redness, or irritation at injection site This list may not describe all possible side effects. Call your doctor for medical advice about side effects. You may report side effects to FDA at 1-800-FDA-1088. Where should I keep my medication? Keep out of the reach of children and pets. Store in a refrigerator or at room temperature between 20 and 25 degrees C (68 and 77 degrees F). Refrigeration (preferred): Store in the refrigerator. Do not freeze. Keep in the original container until you are ready to take it. Remove the dose from the carton about 30 minutes before it is time for you to use it. If the dose is not used, it may be stored in original container at room temperature for 7 days. Get rid of any unused medication after the expiration date. Room Temperature: This medication may be stored at room temperature for up to 7 days. Keep it in the original container. Protect from light until time of use. If it is stored at room temperature, get rid of any unused medication after 7 days or after it expires, whichever is first. To get rid of medications that are no longer needed or have expired: Take the  medication to a medication take-back program. Check with your pharmacy or law enforcement to find a location. If you cannot return the medication, ask your pharmacist or care team how to get rid of this medication safely. NOTE: This sheet is a summary. It may not cover all possible information. If you have questions about this medicine,  talk to your doctor, pharmacist, or health care provider.  2023 Elsevier/Gold Standard (2021-07-30 00:00:00)

## 2022-05-20 NOTE — Telephone Encounter (Signed)
ONO order written, signed, and faxed to [facility]. Received a receipt of confirmation. Patient and wife given contact info for [facility].

## 2022-05-20 NOTE — Telephone Encounter (Signed)
I called pt back.. I relayed that we did speak thru phone message that he is on 5 mg tab of amlodipine daily.  I relayed that I called pharmacy and he is on 5 mg po daily from prescription collected 05-11-2022 at [facility]. He is to speak to pcp (as he had a call into them again).  They would need to discuss with pcp about what they want to do.  He verbalized uderstanding.  He appreciated call.

## 2022-05-20 NOTE — Telephone Encounter (Signed)
I called Dr Luanna Salk office and LVM for his assistant stating Dr Cathren Laine message below, proposing request to decrease Amlodipine to 5 mg from 10 mg. I asked for them to talk to Dr Francesco Sor and call me back to let me know if this is ok since we do not prescribe his Amlodipine.

## 2022-05-20 NOTE — Progress Notes (Signed)
[facility]    Provider:  Dr Lucia Gaskins Requesting Provider: Charlane Ferretti, DO Primary Care Provider:  Charlane Ferretti, DO  CC:  chronic headache  Interval history 05/20/2022: Patient here for follow up on frontal headaches MRi of the brain was unremarkable for age. Crp/sed/tsh rate normal, cbc/cmp unremarkable. It is only when he is upright. When layong down it goes away. He has had painin is right eye and Mondy he got dizzy. He has daily headaches anytime he as constant headache. 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 MRi showed incidental stroke.  Meds used in headache management include: Tylenol, amlodipine, aspirin, Decadron, Benadryl, olmesartan, naproxen, prednisone, Benadryl, amitriptyline/nortriptyline contraindicated in the elderly, propranolol and other beta-blockers contraindicated as he is already tried calcium channel blockers and is already on a blood pressure medication for his heart amlodipine which is a calcium channel blocker similar to verapamil.  Triptans contraindicated due to age and remote TIA.Topiramate tried in the remote past. He wakes him self up snoring. Headaches. The back is the worst.   MRI brain 2023: FINDINGS:  The brain parenchyma shows remote lacunar infarct in the right paramedian pons.  There are scattered bilateral periventricular and subcortical white matter hyperintensities on T2/flair which are age-appropriate changes of chronic small vessel disease.  No structural lesion, tumor or infarct is noted.  Subarachnoid spaces and ventricular system appear normal.  Cortical sulci and gyri show normal appearance.  Extra-axial brain structures appear normal.  Calvarium shows no abnormalities.  Orbits appear unremarkable.  Paranasal sinuses show mild chronic inflammatory changes.  Pituitary gland and cerebellar tonsils appear normal.  Visualized portion of the upper cervical spine show only minor degenerative changes.  Flow-voids of large vessels of  intracranial circulation appear to be patent.  Postcontrast images do not result in abnormal areas of enhancement.         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  There were no acute abnormalities  Patient complains of symptoms per HPI as well as the following symptoms: headaches . Pertinent negatives and positives per HPI. All others negative: headaches  HPI:  Austin Carter is a 76 y.o. blind male here as requested by Charlane Ferretti, DO for headache.  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  I reviewed Dr. Feliberto Harts notes, patient has a headache, history of retinitis pigmentosa, possibly intracranial hypertension or medication overuse headache, he has "sinus headaches" that flareup and sinus congestion but this time is lasted for 6 weeks despite 2 rounds of antibiotics and prednisone not improved when bending over and worsened, tried to wean him off Tylenol.  Headache started 5 weeks ago, majority in the frontal area bilaterally forehead and both temples, worse with bending over, had eyes checked last week no etiology, wife is here and also provides information, at the time the headache started he got overheated and dehydrated and it got away from him and he felt weak and blood pressure systolic was in the 60s. They started not too  long after that. More pressure in the forehead. He cannot see light.  Daily. Continuous. Better when laying down, in the morning they are nit there until he starts moving around then all day until he goes to bed. Ibuprofen helps. Not migrainous. More of a constant. Dull headache. No fall before the headache started. Steroids did not help at all. Feels like a sinus infection. No jaw pain, no neck pain, no fevers. No focal weakness. Positional headache. The headaches did not coincide with any changes in medication. No other focal neurologic deficits, associated symptoms, inciting events or modifiable factors.  Reviewed notes, labs and imaging from outside physicians, which showed:   04/07/2022: CT MAXILLOFACIAL WITHOUT CONTRAST reviewed images and agree   TECHNIQUE: Multidetector CT imaging of the maxillofacial structures was performed. Multiplanar CT image reconstructions were also generated.   RADIATION DOSE REDUCTION: This exam was performed according to the departmental dose-optimization program which includes automated exposure control, adjustment of the mA and/or kV according to patient size and/or use of iterative reconstruction technique.   COMPARISON:  None Available.   FINDINGS: Osseous: No fracture or mandibular dislocation. No destructive process.   Orbits: Negative. No traumatic or inflammatory finding.   Sinuses: Opacified right sphenoethmoidal recess. Otherwise, clear sinuses.   Soft tissues: Negative.   Limited intracranial: No significant or unexpected finding.   IMPRESSION: Essentially clear sinuses.    Review of Systems: Patient complains of symptoms per HPI as well as the following symptoms headache. Pertinent negatives and positives per HPI. All others negative.   Social History   Socioeconomic History   Marital status: Married    Spouse name: Not on file   Number of children: Not on file   Years of education: Not on file   Highest education level: Not on file  Occupational History   Not on file  Tobacco Use   Smoking  status: Former    Packs/day: 1.00    Years: 6.00    Total pack years: 6.00    Types: Cigarettes    Quit date: 74    Years since quitting: 51.8   Smokeless tobacco: Never  Vaping Use   Vaping Use: Never used  Substance and Sexual Activity   Alcohol use: Not Currently    Alcohol/week: 2.0 standard drinks of alcohol    Types: 2 Glasses of wine per week   Drug use: No   Sexual activity: Not on file  Other Topics Concern   Not on file  Social History Narrative   Not on file   Social Determinants of Health   Financial Resource Strain: Not on file  Food Insecurity: Not on file  Transportation Needs: Not on file  Physical Activity: Not on file  Stress: Not on file  Social Connections: Not on file  Intimate Partner Violence: Not on file    Family History  Problem Relation Age of Onset   Colon cancer Neg Hx    Pancreatic cancer Neg Hx    Stomach cancer Neg Hx    Rectal cancer Neg Hx    Migraines Neg Hx    Headache Neg Hx    Sleep apnea Neg Hx     Past Medical History:  Diagnosis Date   Blind    Concussion 2016   no residual   Hypertension    Hypothyroidism    Retinitis pigmentosa    blind    Patient Active Problem List   Diagnosis Date Noted   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   Frontal  headache 04/20/2022   New onset of headaches after age 50 04/20/2022    Past Surgical History:  Procedure Laterality Date   EYE SURGERY  1979   cataracts both eyes   HYDROCELE EXCISION Right 2007   HYDROCELE EXCISION Left 03/12/2020   Procedure: LEFT HYDROCELECTOMY ADULT;  Surgeon: Lucas Mallow, MD;  Location: [facility];  Service: Urology;  Laterality: Left;   left hydrocelectomy  02/01/2020   at [facility]   NASAL SINUS SURGERY  03/14/14   neuromal foot  1991   right   torn cartilage right knee  1988    Current Outpatient Medications  Medication Sig Dispense Refill   amLODipine (NORVASC) 10 MG tablet Take 10 mg by mouth daily.      aspirin EC 81 MG tablet Take 81 mg by mouth daily. Swallow whole.     atorvastatin (LIPITOR) 20 MG tablet Take 20 mg by mouth daily.      diphenhydrAMINE (BENADRYL) 25 MG tablet Take 25 mg by mouth at bedtime as needed for allergies or sleep.     famotidine (PEPCID) 20 MG tablet Take 20 mg by mouth 2 (two) times daily.      Galcanezumab-gnlm (EMGALITY) 120 MG/ML SOAJ Inject 120 mg into the skin every 30 (thirty) days. 3 mL 11   levothyroxine (SYNTHROID, LEVOTHROID) 75 MCG tablet Take 75 mcg by mouth daily before breakfast.     predniSONE (DELTASONE) 1 MG tablet Take 2 mg by mouth as needed (sinus).     No current facility-administered medications for this visit.    Allergies as of 05/20/2022   (No Known Allergies)    Vitals: BP (!) 110/59   Pulse 67   Ht 5\' 9"  (1.753 m)   Wt 181 lb (82.1 kg)   BMI 26.73 kg/m  Last Weight:  Wt Readings from Last 1 Encounters:  05/20/22 181 lb (82.1 kg)   Last Height:   Ht Readings from Last 1 Encounters:  05/20/22 5\' 9"  (1.753 m)     Exam: NAD, pleasant                  Speech:    Speech is normal; fluent and spontaneous with normal comprehension.  Cognition:    The patient is oriented to person, place, and time;     recent and remote memory intact;     language fluent;    Cranial Nerves:    Legally blindTrigeminal sensation is intact and the muscles of mastication are normal. The face is symmetric. The palate elevates in the midline. Hearing intact. Voice is normal. Shoulder shrug is normal. The tongue has normal motion without fasciculations.   Coordination:  No dysmetria  Motor Observation:    No asymmetry, no atrophy, and no involuntary movements noted. Tone:    Normal muscle tone.     Strength:    Strength is V/V in the upper and lower limbs.      Sensation: intact to LT      Assessment/Plan:  76 y.o. blind male here as requested by Sueanne Margarita, DO for headache.  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 Headache is tension-type but given concerning symptoms especially positional quality and new onset after 49 need imaging.  Incidental stroke on MRI:  appears lacunar but needs to complete workup LDL < 70 goal discussed HTN: control it but too low blood pressure, 100 systolic today but not orthostatic discuss with pcp may be cause of dizziness Continue ASA CTA of the head and neck due to stroke Echocardiogram 30-day heart monitor  Sleep study overnight ox monitor he does not wear oxygen for nocturnal hypoxemia and morning headaches Emgality:  Start emgality the first month is 2 injections and the one onjection a month for the headaches. Gave samples and will see him back before the samples run out to see if any response Other option: Low pressure CSF leak   Morning headaches, witnessed overnight apneic events, intractable headaches, infarct: sleep studies   Orders Placed This Encounter  Procedures   CT ANGIO HEAD W OR WO CONTRAST   CT ANGIO NECK W OR WO CONTRAST   Basic Metabolic Panel   Ambulatory referral to Sleep Studies   Cardiac event monitor   ECHOCARDIOGRAM COMPLETE BUBBLE STUDY   Meds ordered this encounter  Medications   Galcanezumab-gnlm (EMGALITY) 120 MG/ML SOAJ    Sig: Inject 120 mg into the skin every 30 (thirty) days.    Dispense:  3 mL    Refill:  11   Galcanezumab-gnlm SOAJ 240 mg    Gave loading dose of emgality 120mg  x2  (samples)     Cc: , DO  Rande Brunt, MD  [facility] [address] [place], [place]  Phone [phone] Fax [phone]  I spent more than 40 minutes of face-to-face and non-face-to-face time with  patient on the  1. Lacunar stroke (HCC)   2. Morning headache   3. Snoring   4. Witnessed apneic spells    diagnosis.  This included previsit chart review, lab review, study review, order entry, electronic health record documentation, patient education on the different diagnostic and therapeutic options, counseling and coordination of care, risks and benefits of management, compliance, or risk factor reduction

## 2022-05-24 ENCOUNTER — Other Ambulatory Visit: Payer: Self-pay | Admitting: Neurology

## 2022-05-24 ENCOUNTER — Telehealth: Payer: Self-pay | Admitting: Neurology

## 2022-05-24 DIAGNOSIS — I6381 Other cerebral infarction due to occlusion or stenosis of small artery: Secondary | ICD-10-CM

## 2022-05-24 DIAGNOSIS — R42 Dizziness and giddiness: Secondary | ICD-10-CM

## 2022-05-24 DIAGNOSIS — I639 Cerebral infarction, unspecified: Secondary | ICD-10-CM

## 2022-05-24 NOTE — Telephone Encounter (Signed)
[employer] medicare sent to [facility] they obtain auth [id number] 

## 2022-05-28 ENCOUNTER — Ambulatory Visit (HOSPITAL_COMMUNITY): Payer: Medicare HMO | Attending: Neurology

## 2022-05-28 DIAGNOSIS — I6381 Other cerebral infarction due to occlusion or stenosis of small artery: Secondary | ICD-10-CM | POA: Insufficient documentation

## 2022-05-28 LAB — ECHOCARDIOGRAM COMPLETE BUBBLE STUDY
Area-P 1/2: 4.08 cm2
P 1/2 time: 848 msec
S' Lateral: 2.8 cm

## 2022-05-29 ENCOUNTER — Ambulatory Visit: Payer: Medicare HMO | Attending: Neurology

## 2022-05-29 DIAGNOSIS — I639 Cerebral infarction, unspecified: Secondary | ICD-10-CM

## 2022-05-29 DIAGNOSIS — I6381 Other cerebral infarction due to occlusion or stenosis of small artery: Secondary | ICD-10-CM

## 2022-05-29 DIAGNOSIS — R42 Dizziness and giddiness: Secondary | ICD-10-CM | POA: Diagnosis not present

## 2022-06-02 ENCOUNTER — Other Ambulatory Visit: Payer: Medicare HMO

## 2022-06-10 ENCOUNTER — Telehealth: Payer: Self-pay | Admitting: *Deleted

## 2022-06-10 ENCOUNTER — Ambulatory Visit
Admission: RE | Admit: 2022-06-10 | Discharge: 2022-06-10 | Disposition: A | Discharge status: Home / Self Care | Payer: Medicare HMO | Source: Ambulatory Visit | Attending: Neurology | Admitting: Neurology

## 2022-06-10 DIAGNOSIS — I6381 Other cerebral infarction due to occlusion or stenosis of small artery: Secondary | ICD-10-CM

## 2022-06-10 MED ORDER — IOPAMIDOL (ISOVUE-370) INJECTION 76%
75.0000 mL | Freq: Once | INTRAVENOUS | Status: AC | PRN
  Administered 2022-06-10: 75 mL via INTRAVENOUS

## 2022-06-10 NOTE — Telephone Encounter (Signed)
Received results of Pulse oximetry RA results on pt.  To inbox.

## 2022-06-14 ENCOUNTER — Encounter: Payer: Self-pay | Admitting: Internal Medicine

## 2022-06-14 ENCOUNTER — Ambulatory Visit (INDEPENDENT_AMBULATORY_CARE_PROVIDER_SITE_OTHER): Payer: Medicare Other | Admitting: Internal Medicine

## 2022-06-14 VITALS — BP 165/88 | HR 72 | Wt 195.0 lb

## 2022-06-14 DIAGNOSIS — R7301 Impaired fasting glucose: Secondary | ICD-10-CM

## 2022-06-14 DIAGNOSIS — E782 Mixed hyperlipidemia: Secondary | ICD-10-CM

## 2022-06-14 DIAGNOSIS — H9313 Tinnitus, bilateral: Secondary | ICD-10-CM

## 2022-06-14 DIAGNOSIS — I10 Essential (primary) hypertension: Secondary | ICD-10-CM

## 2022-06-14 DIAGNOSIS — M720 Palmar fascial fibromatosis [Dupuytren]: Secondary | ICD-10-CM

## 2022-06-14 NOTE — Progress Notes (Signed)
Chief Complaint   Patient presents with    Follow-up       HPI    Frederick [name] is a 76 y.o. male here for evaluation and treatment of the following concerns. HLD and HTN 6 month f/u care. He established with new cardiologist Dr Dion Body     Mixed HLD on Lipitor 20 mg with known CAD on ASA 81 mg   Recommend repeat lipid and CMP for LFTS on statin - not fasting will do this at physical   Cardiology notes reviewed - Summary   "Elevated coronary calcium score at 654 (02/19/2020)   Asymptomatic   ECG: NSR , WNL   Pt has significantly CV risk factors but has been asymptomatic   - Add ASA  81 mg po qd  - continue Atorvastatin and Losartan   - Lexiscan Nuclear Stress Test prior to next visit. Pt is unable to exercise on treadmill with high angle due to back pain "    Lab Results   Component Value Date    CHOL 154 12/17/2021    CHOL 155 12/15/2020    CHOL 158 12/06/2019     Lab Results   Component Value Date    HDL 56 12/17/2021    HDL 51 12/15/2020    HDL 56 12/06/2019     Lab Results   Component Value Date    LDL 83 12/17/2021    LDL 81 12/15/2020    LDL 79 12/06/2019     Lab Results   Component Value Date    TRIG 75 12/17/2021    TRIG 114 12/15/2020    TRIG 113 12/06/2019       HTN  He has known white coat HTN  Reviewed at Home BP monitor 125- 138/75 on average  He is on Losartan 100 mg , amlodipine 5 mg   He denies any CP pressure SOB, DORE, no swelling in his legs   Dupytuens contracture surgery 5 days on Oxyonitn or tylenol for pain management     Tinnitus - Lenire device  put the electrodes on the end of his tongue with head phones 5 tiered mix of sound running water. Buzzing,  crescendo beat, plucking, it obliterates the tinnitus -  trigeminal nerve and auditory nerve to the amygdala  - [place] - senior audiologist 30 minutes a day BID x 3 month, [employer]. He is pleased with this approach and will report back results     Skin check Dr Ruel Favors , removed a left scalp lesion benign no new melanoma  or atypical cells    He has noticed his BS as elevated over the years we discussed metformin as a pre-diabetic medication that could benefit both 5-7% weight loss and possible CAD.         The patient's past medical history, surgical history, medications and allergies were reviewed.    No Known Allergies    Medications  Current Outpatient Medications   Medication Sig Dispense Refill    amLODIPine (NORVASC) 5 MG tablet TAKE 1 TABLET DAILY 90 tablet 1    aspirin EC 81 MG EC tablet Take 1 tablet (81 mg) by mouth daily      atorvastatin (LIPITOR) 20 MG tablet TAKE 1 TABLET NIGHTLY 90 tablet 1    Cholecalciferol (Vitamin D3) 50 MCG (2000 UT) Tab Take by mouth daily      eszopiclone (LUNESTA) 2 MG tablet TAKE 1 TABLET NIGHTLY      IMMEDIATELY BEFORE BEDTIME 90 tablet  1    losartan (COZAAR) 100 MG tablet Take 1 tablet (100 mg) by mouth daily 90 tablet 3    oxyCODONE (ROXICODONE) 5 MG immediate release tablet TAKE 1 TO 2 TABLETS EVERY 4 TO 6 HOURS AS NEEDED FOR SEVERE PAIN       No current facility-administered medications for this visit.         Review of Systems   Constitutional: Negative for chills and fever.   HEENT: No HA or blurry vision, no ST, no cough or congestion   Respiratory: Negative for cough and shortness of breath.    Cardiovascular: Negative for chest pain, palpitations and leg swelling.   Gastrointestinal: Negative for diarrhea, heartburn, nausea and vomiting. .   Neurological: Negative for dizziness and headaches.   Psychiatric/Behavioral: Negative for depression. The patient is not nervous/anxious.     Physical Exam:  BP 165/88 (BP Site: Left arm, Patient Position: Sitting, Cuff Size: Medium)   Pulse 72   Wt 88.5 kg (195 lb) Comment: patient refused weight  SpO2 98%   BMI 25.04 kg/m    Wt Readings from Last 3 Encounters:   06/14/22 88.5 kg (195 lb)   04/27/22 82.6 kg (182 lb)   04/12/22 86.2 kg (190 lb)     GEN: well developed well nourished male alert and appropriate in no apparent distress  HENT:  no nasal congestion, oropharynx normal w no exudate or erythema, PERRL,  normal conjunctiva  CV: RRR, no m/r/g.  No LE edema.  2+ radial pulses present and equal.  RESP: CTAB, normal effort, no rhonchi or rales  GI: soft, nontender/ nondistended, NABS, no rebound or guarding, no evident hepatosplenomegaly  MSK: Normal bulk and tone, normal strength 5/5 and sensation UE / LE    A/P    1. Mixed hyperlipidemia  Continue atorvastatin as prescribed  Check CMP and lipids at fasting physical     2. Essential hypertension  Cont to monitor at home if BP consistently above 140 systolic will let me know    3. Impaired fasting blood sugar  Metformin r/b/I discussed he will research   Will discuss at physical as he would like to do life style changes and also discuss with his cardiologist     4. Tinnitus of both ears  Cont Lenire device - PT and report of process    5. Dupuytren's contracture of left hand  Xiaflex for manipulation of the contracture, post radiation not a good surgical candidate for healing    Risks & benefits of the new medication(s) were explained to the patient, who appeared to understand and agrees to the treatment plan.    [name], MD  9:01 AM 06/14/2022    [facility]  [address]  [place], [place] [address]  P) [phone]  F) [phone]  [email or web address]

## 2022-06-14 NOTE — Telephone Encounter (Signed)
Mild desaturation nothing significant thanks

## 2022-06-14 NOTE — Telephone Encounter (Signed)
I called patient.  I advised him that mild desaturation was seen on his pulse oximetry results but nothing significant.  I reminded patient to keep a sleep consult on December 4.  Patient is inquiring about his CTA results from November 18.  I advised him that I do not think Dr. Lucia Gaskins has had a chance to review this but we will call him as soon as she has.  Patient verbalized understanding.

## 2022-06-21 ENCOUNTER — Other Ambulatory Visit: Payer: Self-pay

## 2022-06-21 DIAGNOSIS — E782 Mixed hyperlipidemia: Secondary | ICD-10-CM

## 2022-06-21 DIAGNOSIS — I159 Secondary hypertension, unspecified: Secondary | ICD-10-CM

## 2022-06-21 MED ORDER — ATORVASTATIN CALCIUM 20 MG PO TABS
ORAL_TABLET | ORAL | 1 refills | Status: DC
Start: 2022-06-21 — End: 2022-12-06

## 2022-06-21 MED ORDER — AMLODIPINE BESYLATE 5 MG PO TABS
5.0000 mg | ORAL_TABLET | Freq: Every day | ORAL | 1 refills | Status: DC
Start: 2022-06-21 — End: 2022-12-06

## 2022-06-21 NOTE — Telephone Encounter (Signed)
Patient's CTA showed atherosclerosis/clogged arteries in the back of his brain (neck was ok). This is not causing his headaches but needs to be addressed with very tight control of his vascular risk factors. Vascular risk factors include prior smoking, HTN others include (but he may no thave) are high cholesterol, diabetes, sleep apnea etc. Daily asapirin continue  IMPRESSION: 1. No emergent finding. 2. Mild for age atherosclerosis in the neck without flow reducing stenosis or plaque irregularity. 3. 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

## 2022-06-22 NOTE — Telephone Encounter (Signed)
[name] gave CTA results  Gave Dr Lucia Gaskins recommendations Pt expressed concerns about having a stroke . Per Dr.Ahern  very tight control of his vascular risk factors. Vascular risk factors include prior smoking, HTN others include (but he may no thave) are high cholesterol, diabetes, sleep apnea etc. Daily Asprin  continue Pt wanted to know why he didn't start a blood thinner Informed patient Asprin is a form of a blood thinner . Pt states ok  Asked patient if he had anymore question pt states will be here for Sleep consult next week  Pt thanked me for calling

## 2022-06-28 ENCOUNTER — Encounter: Payer: Self-pay | Admitting: Neurology

## 2022-06-28 ENCOUNTER — Ambulatory Visit: Payer: Medicare HMO | Admitting: Neurology

## 2022-06-28 VITALS — BP 139/74 | HR 74 | Ht 69.0 in | Wt 180.0 lb

## 2022-06-28 DIAGNOSIS — R351 Nocturia: Secondary | ICD-10-CM

## 2022-06-28 DIAGNOSIS — R519 Headache, unspecified: Secondary | ICD-10-CM

## 2022-06-28 DIAGNOSIS — Z9189 Other specified personal risk factors, not elsewhere classified: Secondary | ICD-10-CM

## 2022-06-28 DIAGNOSIS — I6381 Other cerebral infarction due to occlusion or stenosis of small artery: Secondary | ICD-10-CM

## 2022-06-28 DIAGNOSIS — R0683 Snoring: Secondary | ICD-10-CM | POA: Diagnosis not present

## 2022-06-28 DIAGNOSIS — E663 Overweight: Secondary | ICD-10-CM

## 2022-06-28 DIAGNOSIS — Z820 Family history of epilepsy and other diseases of the nervous system: Secondary | ICD-10-CM

## 2022-06-28 NOTE — Progress Notes (Signed)
Subjective:    Patient ID: Austin Carter is a 76 y.o. male.  HPI    Huston Foley, MD, PhD [facility] [address] [address] [place], [place] [address]  Dear [name],  I saw your patient, Austin Carter, upon your kind request in my sleep clinic today for initial consultation of his sleep disorder, in particular, concern for underlying obstructive sleep apnea.  The patient is unaccompanied for this appointment (a neighbor brought him).  As you know, Mr. Austin Carter is a 76 year old male with an underlying medical history retinitis pigmentosa with visual impairment, hypertension, hyperlipidemia, history of concussion, stroke, and mildly overweight state, who reports snoring and recurrent headaches.  I reviewed your office note from 05/20/2022.  His Epworth sleepiness score is 2/24, fatigue severity score is 12 out of 63. He reports that his headaches have improved since starting Emgality injections and also after stopping amlodipine.  He reports a family history of sleep apnea affecting 2 brothers.  He would be willing to get tested and consider PAP therapy.  He lives with his wife, he is retired from [employer] and also worked as a Runner, broadcasting/film/video.  He drinks decaf coffee only.  He has rare alcohol, he quit smoking many years ago.  He likes to listen to the radio before falling asleep.  He has nocturia about once per average night, denies nocturnal or morning headaches.  He has 1 grown son, age 46.  His dentist told him that he grinds his teeth, he does not have a bite guard.  His Past Medical History Is Significant For: Past Medical History:  Diagnosis Date   Blind    Concussion 2016   no residual   Hypertension    Hypothyroidism    Retinitis pigmentosa    blind    His Past Surgical History Is Significant For: Past Surgical History:  Procedure Laterality Date   EYE SURGERY  1979   cataracts both eyes   HYDROCELE EXCISION Right 2007   HYDROCELE  EXCISION Left 03/12/2020   Procedure: LEFT HYDROCELECTOMY ADULT;  Surgeon: Crista Elliot, MD;  Location: [facility];  Service: Urology;  Laterality: Left;   left hydrocelectomy  02/01/2020   at [facility]   NASAL SINUS SURGERY  03/14/14   neuromal foot  1991   right   torn cartilage right knee  1988    His Family History Is Significant For: Family History  Problem Relation Age of Onset   Colon cancer Neg Hx    Pancreatic cancer Neg Hx    Stomach cancer Neg Hx    Rectal cancer Neg Hx    Migraines Neg Hx    Headache Neg Hx    Sleep apnea Neg Hx     His Social History Is Significant For: Social History   Socioeconomic History   Marital status: Married    Spouse name: Not on file   Number of children: Not on file   Years of education: Not on file   Highest education level: Not on file  Occupational History   Not on file  Tobacco Use   Smoking status: Former    Packs/day: 1.00    Years: 6.00    Total pack years: 6.00    Types: Cigarettes    Quit date: 16    Years since quitting: 51.9   Smokeless tobacco: Never  Vaping Use   Vaping Use: Never used  Substance and Sexual Activity   Alcohol use:  Not Currently    Alcohol/week: 2.0 standard drinks of alcohol    Types: 2 Glasses of wine per week   Drug use: No   Sexual activity: Not on file  Other Topics Concern   Not on file  Social History Narrative   Not on file   Social Determinants of Health   Financial Resource Strain: Not on file  Food Insecurity: Not on file  Transportation Needs: Not on file  Physical Activity: Not on file  Stress: Not on file  Social Connections: Not on file    His Allergies Are:  No Known Allergies:   His Current Medications Are:  Outpatient Encounter Medications as of 06/28/2022  Medication Sig   aspirin EC 81 MG tablet Take 81 mg by mouth daily. Swallow whole.   atorvastatin (LIPITOR) 20 MG tablet Take 20 mg by mouth daily.     diphenhydrAMINE (BENADRYL) 25 MG tablet Take 25 mg by mouth at bedtime as needed for allergies or sleep.   Galcanezumab-gnlm (EMGALITY) 120 MG/ML SOAJ Inject 120 mg into the skin every 30 (thirty) days.   levothyroxine (SYNTHROID, LEVOTHROID) 75 MCG tablet Take 75 mcg by mouth daily before breakfast.   predniSONE (DELTASONE) 1 MG tablet Take 2 mg by mouth as needed (sinus).   [DISCONTINUED] amLODipine (NORVASC) 10 MG tablet Take 10 mg by mouth daily. (Patient not taking: Reported on 06/28/2022)   [DISCONTINUED] famotidine (PEPCID) 20 MG tablet Take 20 mg by mouth 2 (two) times daily.  (Patient not taking: Reported on 06/28/2022)   No facility-administered encounter medications on file as of 06/28/2022.  :   Review of Systems:  Out of a complete 14 point review of systems, all are reviewed and negative with the exception of these symptoms as listed below:  Review of Systems  Neurological:        Sleep consult. Pt here alone (brought by neighbor) she is out in waiting area.  AM headaches.  Wakes snoring.  ESS  2 FSS 12.  Pt is BLIND - retina pigmentosa.    Objective:  Neurological Exam  Physical Exam Physical Examination:   Vitals:   06/28/22 1229  BP: 139/74  Pulse: 74    General Examination: The patient is a very pleasant 76 y.o. male in no acute distress. He appears well-developed and well-nourished and well groomed.   HEENT: Normocephalic, atraumatic, dark glasses in place, visually impaired.  Hearing is mildly impaired.  Face is symmetric with normal facial animation. Speech is clear with no dysarthria noted. There is no hypophonia. There is no lip, neck/head, jaw or voice tremor. Neck is supple with full range of passive and active motion. There are no carotid bruits on auscultation. Oropharynx exam reveals: mild mouth dryness, adequate dental hygiene and moderate airway crowding, due to smaller airway entry and redundant soft palate, tonsils on the smaller side, Mallampati class  III, neck circumference of 15 three-quarter inches, minimal overbite.  Tongue protrudes centrally and palate elevates symmetrically.  Chest: Clear to auscultation without wheezing, rhonchi or crackles noted.  Heart: S1+S2+0, regular and normal without murmurs, rubs or gallops noted.   Abdomen: Soft, non-tender and non-distended.  Extremities: There is trace pitting edema in the distal lower extremities bilaterally.   Skin: Warm and dry without trophic changes noted.   Musculoskeletal: exam reveals no obvious joint deformities.   Neurologically:  Mental status: The patient is awake, alert and oriented in all 4 spheres. His immediate and remote memory, attention, language skills and fund of  knowledge are appropriate. There is no evidence of aphasia, agnosia, apraxia or anomia. Speech is clear with normal prosody and enunciation. Thought process is linear. Mood is normal and affect is normal.  Cranial nerves II - XII are as described above under HEENT exam.  Motor exam: Normal bulk, strength and tone is noted. There is no obvious action or resting tremor.  Fine motor skills and coordination: grossly intact.  Cerebellar testing: No dysmetria or intention tremor. There is no truncal or gait ataxia.  Sensory exam: intact to light touch in the upper and lower extremities.  Gait, station and balance: He stands easily. No veering to one side is noted. No leaning to one side is noted. Posture is age-appropriate and stance is narrow based. Gait shows normal stride length and normal pace. No problems turning are noted.  Uses a walking stick.   Assessment and Plan:  In summary, ABRAM SAX is a very pleasant 76 y.o.-year old male with an underlying medical history retinitis pigmentosa with visual impairment, hypertension, hyperlipidemia, history of concussion, stroke, and mildly overweight state, whose history and physical exam are concerning for sleep disordered breathing, particularly obstructive  sleep apnea. A laboratory attended sleep study is typically considered "gold standard" for evaluation of sleep disordered breathing.   I had a long chat with the patient about my findings and the diagnosis of sleep apnea, particularly OSA, its prognosis and treatment options. We talked about medical/conservative treatments, surgical interventions and non-pharmacological approaches for symptom control. I explained, in particular, the risks and ramifications of untreated moderate to severe OSA, especially with respect to developing cardiovascular disease down the road, including congestive heart failure (CHF), difficult to treat hypertension, cardiac arrhythmias (particularly A-fib), neurovascular complications including TIA, stroke and dementia. Even type 2 diabetes has, in part, been linked to untreated OSA. Symptoms of untreated OSA may include (but may not be limited to) daytime sleepiness, nocturia (i.e. frequent nighttime urination), memory problems, mood irritability and suboptimally controlled or worsening mood disorder such as depression and/or anxiety, lack of energy, lack of motivation, physical discomfort, as well as recurrent headaches, especially morning or nocturnal headaches. We talked about the importance of maintaining a healthy lifestyle and striving for healthy weight.  In addition, we talked about the importance of striving for and maintaining good sleep hygiene. I recommended a sleep study at this time. I outlined the differences between a laboratory attended sleep study which is considered more comprehensive and accurate over the option of a home sleep test (HST); the latter may lead to underestimation of sleep disordered breathing in some instances and does not help with diagnosing upper airway resistance syndrome and is not accurate enough to diagnose primary central sleep apnea typically. I outlined possible surgical and non-surgical treatment options of OSA, including the use of a  positive airway pressure (PAP) device (i.e. CPAP, AutoPAP/APAP or BiPAP in certain circumstances), a custom-made dental device (aka oral appliance, which would require a referral to a specialist dentist or orthodontist typically, and is generally speaking not considered for patients with full dentures or edentulous state), upper airway surgical options, such as traditional UPPP (which is not considered a first-line treatment) or the Inspire device (hypoglossal nerve stimulator, which would involve a referral for consultation with an ENT surgeon, after careful selection, following inclusion criteria - also not first-line treatment). I explained the PAP treatment option to the patient in detail, as this is generally considered first-line treatment.  I was able to show him an AutoPap model.  The patient indicated that he would be willing to try PAP therapy, if the need arises. I explained the importance of being compliant with PAP treatment, not only for insurance purposes but primarily to improve patient's symptoms symptoms, and for the patient's long term health benefit, including to reduce His cardiovascular risks longer-term.    We will pick up our discussion about the next steps and treatment options after testing.  We will keep him posted as to the test results by phone call and/or MyChart messaging where possible.  We will plan to follow-up in sleep clinic accordingly as well.  I answered all his questions today and the patient was in agreement.   I encouraged him to call with any interim questions, concerns, problems or updates or email us through MyChart.  Generally speaking, sleep test authorizations may take up to 2 weeks, sometimes less, sometimes longer, the patient is encouraged to get in touch with us if they do not hear back from the sleep lab staff directly within the next 2 weeks.  Thank you very much for allowing me to participate in the care of this nice patient. If I can be of any further  assistance to you please do not hesitate to talk to me.  Sincerely,   Huston FoleySaima Reeta Kuk, MD, PhD

## 2022-06-28 NOTE — Patient Instructions (Signed)

## 2022-06-29 ENCOUNTER — Encounter: Payer: Self-pay | Admitting: Neurology

## 2022-06-30 ENCOUNTER — Encounter: Payer: Self-pay | Admitting: Neurology

## 2022-07-01 ENCOUNTER — Telehealth: Payer: Self-pay | Admitting: Neurology

## 2022-07-01 NOTE — Telephone Encounter (Signed)
[employer] pending uploaded notes  

## 2022-07-26 ENCOUNTER — Ambulatory Visit: Payer: Medicare HMO | Admitting: Neurology

## 2022-07-26 DIAGNOSIS — I6381 Other cerebral infarction due to occlusion or stenosis of small artery: Secondary | ICD-10-CM

## 2022-07-26 DIAGNOSIS — R519 Headache, unspecified: Secondary | ICD-10-CM

## 2022-07-26 DIAGNOSIS — Z9189 Other specified personal risk factors, not elsewhere classified: Secondary | ICD-10-CM

## 2022-07-26 DIAGNOSIS — G4733 Obstructive sleep apnea (adult) (pediatric): Secondary | ICD-10-CM

## 2022-07-26 DIAGNOSIS — Z820 Family history of epilepsy and other diseases of the nervous system: Secondary | ICD-10-CM

## 2022-07-26 DIAGNOSIS — R351 Nocturia: Secondary | ICD-10-CM

## 2022-07-26 DIAGNOSIS — E663 Overweight: Secondary | ICD-10-CM

## 2022-07-26 DIAGNOSIS — G478 Other sleep disorders: Secondary | ICD-10-CM

## 2022-07-26 DIAGNOSIS — G4720 Circadian rhythm sleep disorder, unspecified type: Secondary | ICD-10-CM

## 2022-07-26 DIAGNOSIS — R0683 Snoring: Secondary | ICD-10-CM | POA: Diagnosis not present

## 2022-08-02 NOTE — Procedures (Signed)
Physician Interpretation:     [facility] POLYSOMNOGRAPHY  INTERPRETATION REPORT   STUDY DATE:  07/26/2022     PATIENT NAME:  Austin Carter         DATE OF BIRTH:  [birth year]  PATIENT ID:  [id number]    TYPE OF STUDY:  PSG  READING PHYSICIAN: Huston Foley, MD, PhD   SCORING TECHNICIAN: Margaretann Loveless, RPSGT  Referred by: Dr. Naomie Dean  History: 77 year old male with an underlying medical history retinitis pigmentosa?with visual impairment, hypertension, hyperlipidemia, history of concussion, stroke, and mildly overweight state, who reports snoring and?recurrent headaches. His Epworth sleepiness score is 2/24,?fatigue severity score is 12 out of 63.  Height: 69 in Weight: 180 lb (BMI 26) Neck Size: 16 in   MEDICATIONS: Aspirin, Lipitor, Benadryl, Emaglity, Synthroid, Prednisone  TECHNICAL DESCRIPTION: A registered sleep technologist was in attendance for the duration of the recording.  Data collection, scoring, video monitoring, and reporting were performed in compliance with the AASM Manual for the Scoring of Sleep and Associated Events; (Hypopnea is scored based on the criteria listed in Section VIII D. 1b in the AASM Manual V2.6 using a 4% oxygen desaturation rule or Hypopnea is scored based on the criteria listed in Section VIII D. 1a in the AASM Manual V2.6 using 3% oxygen desaturation and /or arousal rule).   SLEEP CONTINUITY AND SLEEP ARCHITECTURE:  Lights-out was at 21:35: and lights-on at  05:00:, with a total recording time of 7 hour, 25 min. Total sleep time ( TST) was 240.0 minutes with a decreased sleep efficiency at 53.9%.   BODY POSITION:  TST was divided  between the following sleep positions: 48.5% supine;  51.5% lateral;  0% prone. Duration of total sleep and percent of total sleep in their respective position is as follows: supine 116 minutes (49%), non-supine 124 minutes (51%); right 123 minutes (51%), left 00 minutes (0%), and prone 00  minutes (0%).  Total supine REM sleep time was 00 minutes (0% of total REM sleep).  Sleep latency was mildly increased at 35.5 minutes.  REM sleep was absent. Of the total sleep time, the percentage of stage N1 sleep was 29.2%, which is markedly increased, stage N2 sleep was 55%, which is normal, stage N3 sleep was 15.4%, and REM sleep was absent. Wake after sleep onset (WASO) time accounted for 169.5 minutes with moderate to severe sleep fragmentation noted.   RESPIRATORY MONITORING:   Based on CMS criteria (using a 4% oxygen desaturation rule for scoring hypopneas), there were 5 apneas (0 obstructive; 3 central; 2 mixed), and 103 hypopneas.  Apnea index was 1.3. Hypopnea index was 25.8. The apnea-hypopnea index was 27.0/hour overall (51.5 supine, 0 non-supine; 0.0 REM, 0.0 supine REM).  There were 0 respiratory effort-related arousals (RERAs).  The RERA index was 0 events/h. Total respiratory disturbance index (RDI) was 27.0 events/h. RDI results showed: supine RDI  51.5 /h; non-supine RDI 3.9 /h; REM RDI 0.0 /h, supine REM RDI 0.0 /h.   Based on AASM criteria (using a 3% oxygen desaturation and /or arousal rule for scoring hypopneas), there were 5 apneas (0 obstructive; 3 central; 2 mixed), and 105 hypopneas. Apnea index was 1.3. Hypopnea index was 26.3. The apnea-hypopnea index was 27.5 overall (52.5 supine, 0 non-supine; 0.0 REM, 0.0 supine REM).  There were 0 respiratory effort-related arousals (RERAs).  The RERA index was 0 events/h. Total respiratory disturbance index (RDI) was 27.5 events/h. RDI results showed: supine RDI  52.5 /h; non-supine RDI  3.9 /h; REM RDI 0.0 /h, supine REM RDI 0.0 /h.   OXIMETRY: Oxyhemoglobin Saturation Nadir during sleep was at  85%) from a mean of 94%.  Of the Total sleep time (TST)   hypoxemia (=<88%) was present for  1.6 minutes, or 0.7% of total sleep time.   LIMB MOVEMENTS: There were 0 periodic limb movements of sleep (0.0/hr), of which 0 (0.0/hr) were  associated with an arousal.  AROUSAL: There were 125 arousals in total, for an arousal index of 31 arousals/hour.  Of these, 67 were identified as respiratory-related arousals (17 /h), 0 were PLM-related arousals (0 /h), and 87 were non-specific arousals (22 /h).  EEG: Review of the EEG showed no abnormal electrical discharges and symmetrical bihemispheric findings.    EKG: The EKG revealed normal sinus rhythm (NSR). The average heart rate during sleep was 65 bpm.   AUDIO/VIDEO REVIEW: The audio and video review did not show any abnormal or unusual behaviors, movements, phonations or vocalizations. The patient took 1 restroom break.  Snoring was noted in the mild to moderate range.  POST-STUDY QUESTIONNAIRE: Post study, the patient indicated, that sleep was worse than usual and that he had a hard time sleeping with the "thing in nose". (The acquisition technologist help to fill out the questionnaire as the patient is vision impaired.)    IMPRESSION:  1. Moderate Obstructive sleep apnea (OSA) 2. Dysfunctions associated with sleep stages or arousal from sleep 3. Poor sleep pattern  RECOMMENDATIONS:  1. This study demonstrates moderate to severe obstructive sleep apnea, with a total AHI of 27/hour, supine AHI of 51.5/hour and O2 nadir of 85%.  Please note, the absence of REM sleep during the study likely underestimated his sleep disordered breathing.  Treatment with positive airway pressure in the form of CPAP or AutoPap therapy is recommended. This will require - ideally - a full night titration study to optimize therapy settings, mask fit, monitoring of tolerance and of proper oxygen saturations.  The patient will be advised to proceed with home AutoPap therapy for now.  A designated titration study can be considered at a later date to optimize treatment and improve adherence if need be.  Other treatment options may be limited, and may include (generally speaking) surgical options in selected  patients or the use of an oral appliance in certain patients. Concomitant weight loss is recommended, where clinically indicated. Please note that untreated obstructive sleep apnea may carry additional perioperative morbidity. Patients with significant obstructive sleep apnea should receive perioperative PAP therapy and the surgeons and particularly the anesthesiologist should be informed of the diagnosis and the severity of the sleep disordered breathing. 2. This study shows significant sleep fragmentation and abnormal sleep stage percentages; these are nonspecific findings and per se do not signify an intrinsic sleep disorder or a cause for the patient's sleep-related symptoms. Causes include (but are not limited to) the first night effect of the sleep study, circadian rhythm disturbances, medication effect or an underlying mood disorder or medical problem.  3. The patient should be cautioned not to drive, work at heights, or operate dangerous or heavy equipment when tired or sleepy. Review and reiteration of good sleep hygiene measures should be pursued with any patient. 4. The patient will be seen in follow-up in the sleep clinic at [facility] for discussion of the test results, symptom and treatment compliance review, further management strategies, etc. The patient and his referring provider will be notified of the test results.   I certify that I  have reviewed the entire raw data recording prior to the issuance of this report in accordance with the Standards of Accreditation of the American Academy of Sleep Medicine (AASM).  Huston Foley, MD, PhD Medical Director, [facility]) Diplomat, ABPN (Neurology and Sleep)              Technical Report:   General Information  Name: Austin Carter, Austin Carter BMI: 63.84 Physician: Huston Foley, MD  ID: [id number] Height: 69.0 in Technician: Margaretann Loveless, RPSGT  Sex: Male Weight: 180.0 lb Record: [id number]  Age: 65  [April 02, 1946] Date: 07/26/2022    Medical & Medication History    77 year old male with an underlying medical history retinitis pigmentosa with visual impairment, hypertension, hyperlipidemia, history of concussion, stroke, and mildly overweight state, who reports snoring and recurrent headaches. Aspirin, Lipitor, Benadryl, Emaglity, Synthroid, Prednisone   Sleep Disorder      Comments   The patient came into the sleep lab for a PSG study. The patient took Benadryl to arrival to sleep lab. study. One restroom trip. EKG kept in NSR. Mild to moderate snoring. Snoring and respiratory events were worse while supine. All respiratory events scored with a 4% desat. The patient slept supine and lateral. The patient had a lot of wake after sleep onset. No REM sleep observed.     Lights out: 09:35:24 PM Lights on: 05:00:04 AM   Time Total Supine Side Prone Upright  Recording (TRT) 7h 25.57m 4h 8.46m 3h 17.61m 0h 0.33m 0h 0.7m  Sleep (TST) 4h 0.19m 1h 56.76m 2h 3.51m 0h 0.70m 0h 0.56m   Latency N1 N2 N3 REM Onset Per. Slp. Eff.  Actual 0h 0.108m 0h 1.44m 0h 10.53m 0h 0.31m 0h 35.60m 0h 35.23m 53.93%   Stg Dur Wake N1 N2 N3 REM  Total 205.0 70.0 133.0 37.0 0.0  Supine 131.5 45.0 71.5 0.0 0.0  Side 73.5 25.0 61.5 37.0 0.0  Prone 0.0 0.0 0.0 0.0 0.0  Upright 0.0 0.0 0.0 0.0 0.0   Stg % Wake N1 N2 N3 REM  Total 46.1 29.2 55.4 15.4 0.0  Supine 29.6 18.8 29.8 0.0 0.0  Side 16.5 10.4 25.6 15.4 0.0  Prone 0.0 0.0 0.0 0.0 0.0  Upright 0.0 0.0 0.0 0.0 0.0     Apnea Summary Sub Supine Side Prone Upright  Total 5 Total 5 5 0 0 0    REM 0 0 0 0 0    NREM 5 5 0 0 0  Obs 0 REM 0 0 0 0 0    NREM 0 0 0 0 0  Mix 2 REM 0 0 0 0 0    NREM 2 2 0 0 0  Cen 3 REM 0 0 0 0 0    NREM 3 3 0 0 0   Rera Summary Sub Supine Side Prone Upright  Total 0 Total 0 0 0 0 0    REM 0 0 0 0 0    NREM 0 0 0 0 0   Hypopnea Summary Sub Supine Side Prone Upright  Total 105 Total 105 97 8 0 0    REM 0 0 0 0 0    NREM 105 97 8 0 0   4%  Hypopnea Summary Sub Supine Side Prone Upright  Total (4%) 103 Total 103 95 8 0 0    REM 0 0 0 0 0    NREM 103 95 8 0 0     AHI Total Obs Mix Cen  27.50  Apnea 1.25 0.00 0.50 0.75   Hypopnea 26.25 -- -- --  27.00 Hypopnea (4%) 25.75 -- -- --    Total Supine Side Prone Upright  Position AHI 27.50 52.53 3.89 0.00 0.00  REM AHI 0.00   NREM AHI 27.50   Position RDI 27.50 52.53 3.89 0.00 0.00  REM RDI 0.00   NREM RDI 27.50    4% Hypopnea Total Supine Side Prone Upright  Position AHI (4%) 27.00 51.50 3.89 0.00 0.00  REM AHI (4%) 0.00   NREM AHI (4%) 27.00   Position RDI (4%) 27.00 51.50 3.89 0.00 0.00  REM RDI (4%) 0.00   NREM RDI (4%) 27.00    Desaturation Information Threshold: 2% <100% <90% <80% <70% <60% <50% <40%  Supine 208.0 14.0 0.0 0.0 0.0 0.0 0.0  Side 61.0 0.0 0.0 0.0 0.0 0.0 0.0  Prone 0.0 0.0 0.0 0.0 0.0 0.0 0.0  Upright 0.0 0.0 0.0 0.0 0.0 0.0 0.0  Total 269.0 14.0 0.0 0.0 0.0 0.0 0.0  Index 41.7 2.2 0.0 0.0 0.0 0.0 0.0   Threshold: 3% <100% <90% <80% <70% <60% <50% <40%  Supine 174.0 14.0 0.0 0.0 0.0 0.0 0.0  Side 20.0 0.0 0.0 0.0 0.0 0.0 0.0  Prone 0.0 0.0 0.0 0.0 0.0 0.0 0.0  Upright 0.0 0.0 0.0 0.0 0.0 0.0 0.0  Total 194.0 14.0 0.0 0.0 0.0 0.0 0.0  Index 30.1 2.2 0.0 0.0 0.0 0.0 0.0   Threshold: 4% <100% <90% <80% <70% <60% <50% <40%  Supine 150.0 14.0 0.0 0.0 0.0 0.0 0.0  Side 11.0 0.0 0.0 0.0 0.0 0.0 0.0  Prone 0.0 0.0 0.0 0.0 0.0 0.0 0.0  Upright 0.0 0.0 0.0 0.0 0.0 0.0 0.0  Total 161.0 14.0 0.0 0.0 0.0 0.0 0.0  Index 25.0 2.2 0.0 0.0 0.0 0.0 0.0   Threshold: 3% <100% <90% <80% <70% <60% <50% <40%  Supine 174 14 0 0 0 0 0  Side 20 0 0 0 0 0 0  Prone 0 0 0 0 0 0 0  Upright 0 0 0 0 0 0 0  Total 194 14 0 0 0 0 0   Awakening/Arousal Information # of Awakenings 53  Wake after sleep onset 169.18m  Wake after persistent sleep 169.20m   Arousal Assoc. Arousals Index  Apneas 3 0.8  Hypopneas 64 16.0  Leg Movements 0 0.0  Snore 0 0.0  PTT Arousals  0 0.0  Spontaneous 87 21.8  Total 154 38.5  Leg Movement Information PLMS LMs Index  Total LMs during PLMS 0 0.0  LMs w/ Microarousals 0 0.0   LM LMs Index  w/ Microarousal 0 0.0  w/ Awakening 0 0.0  w/ Resp Event 0 0.0  Spontaneous 2 0.5  Total 2 0.5     Desaturation threshold setting: 3% Minimum desaturation setting: 10 seconds SaO2 nadir: 81% The longest event was a 55 sec obstructive Hypopnea with a minimum SaO2 of 93%. The lowest SaO2 was 81% associated with a 49 sec obstructive Hypopnea. EKG Rates EKG Avg Max Min  Awake 66 95 55  Asleep 65 82 54  EKG Events: N/A

## 2022-08-02 NOTE — Addendum Note (Signed)
Addended by: Star Age on: 08/02/2022 06:20 PM   Modules accepted: Orders

## 2022-08-10 ENCOUNTER — Encounter: Payer: Self-pay | Admitting: Neurology

## 2022-08-10 ENCOUNTER — Telehealth: Payer: Self-pay | Admitting: *Deleted

## 2022-08-10 ENCOUNTER — Ambulatory Visit: Payer: Medicare HMO | Admitting: Neurology

## 2022-08-10 ENCOUNTER — Telehealth: Payer: Self-pay | Admitting: Neurology

## 2022-08-10 VITALS — BP 125/59 | HR 68 | Ht 69.0 in | Wt 181.2 lb

## 2022-08-10 DIAGNOSIS — R0981 Nasal congestion: Secondary | ICD-10-CM

## 2022-08-10 DIAGNOSIS — I6381 Other cerebral infarction due to occlusion or stenosis of small artery: Secondary | ICD-10-CM | POA: Diagnosis not present

## 2022-08-10 DIAGNOSIS — G4733 Obstructive sleep apnea (adult) (pediatric): Secondary | ICD-10-CM

## 2022-08-10 NOTE — Progress Notes (Signed)
[facility]    Provider:  Dr Jaynee Eagles Requesting Provider: Sueanne Margarita, DO Primary Care Provider:  Sueanne Margarita, DO  CC:  chronic headache  Interval headaches 08/10/2022: Patient did not improve on Emgality because his headaches likely due to OSA; sleep study came back with moderate to severe sleep apnea, AHI overall 27/hr, supine 51/hr and an O2 nadir of 85%. Explained this is likely the cause of his headaches, reviewed sequelae of untreated sleep apnea, I think this is the cause of his headaches and a definite risk factor for strokes. We spoke for a long time, he was quite resistant. I brought my sleep medical director to speak with him, showed him some masks and answered all questions extended visit then we sent him a referral to get his cpap and set him up with follow up. Here with wife.  - Continue ASA - Continue Statin - Continue to manage EVERY possible vascular risk factor including  - DEFINITELY need to be treated for sleep apnea  Patient complains of symptoms per HPI as well as the following symptoms: sleep apnea . Pertinent negatives and positives per HPI. All others negative   Interval history 05/20/2022: Patient here for follow up on frontal headaches MRi of the brain was unremarkable for age. Crp/sed/tsh rate normal, cbc/cmp unremarkable. It is only when he is upright. When layong down it goes away. He has had painin is right eye and Mondy he got dizzy. He has daily headaches anytime he as constant headache. 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 MRi showed incidental stroke.  Meds used in headache management include: Tylenol, amlodipine, aspirin, Decadron, Benadryl, olmesartan, naproxen, prednisone, Benadryl, amitriptyline/nortriptyline contraindicated in the elderly, propranolol and other beta-blockers contraindicated as he is already tried calcium channel blockers and is already on a blood pressure medication for his heart amlodipine which is a calcium  channel blocker similar to verapamil.  Triptans contraindicated due to age and remote TIA.Topiramate tried in the remote past. He wakes him self up snoring. Headaches. The back is the worst.   MRI brain 2023: FINDINGS:  The brain parenchyma shows remote lacunar infarct in the right paramedian pons.  There are scattered bilateral periventricular and subcortical white matter hyperintensities on T2/flair which are age-appropriate changes of chronic small vessel disease.  No structural lesion, tumor or infarct is noted.  Subarachnoid spaces and ventricular system appear normal.  Cortical sulci and gyri show normal appearance.  Extra-axial brain structures appear normal.  Calvarium shows no abnormalities.  Orbits appear unremarkable.  Paranasal sinuses show mild chronic inflammatory changes.  Pituitary gland and cerebellar tonsils appear normal.  Visualized portion of the upper cervical spine show only minor degenerative changes.  Flow-voids of large vessels of intracranial circulation appear to be patent.  Postcontrast images do not result in abnormal areas of enhancement.         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  There were no acute abnormalities  Patient complains of symptoms per HPI as well as the following symptoms: headaches . Pertinent negatives and positives per HPI. All others negative: headaches  HPI:  Austin Carter is a 77 y.o. blind male here as requested by Sueanne Margarita, DO for headache.  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, left upper thigh sensation change, also right leg length discrepancy by about half an inch, no alcohol use, remote smoker.  I reviewed Dr. Feliberto Harts notes, patient has a headache, history of retinitis pigmentosa, possibly intracranial hypertension or medication overuse headache, he has "sinus headaches" that flareup and sinus congestion but this time is lasted for 6 weeks despite 2 rounds of antibiotics and prednisone not improved when bending over and worsened, tried to wean him off Tylenol.  Headache started 5 weeks ago, majority in the frontal area bilaterally forehead and both temples, worse with bending over, had eyes checked last week no etiology, wife is here and also provides information, at the time the headache started he got overheated and dehydrated and it got away from him and he felt weak and blood pressure systolic was in the 60s. They started not too long after that. More pressure in the forehead. He cannot see light. Daily. Continuous. Better when laying down, in the morning they are nit there until he starts moving around then all day until he goes to bed. Ibuprofen helps. Not migrainous. More of a constant. Dull headache. No fall before the headache started. Steroids did not help at all. Feels like a sinus infection. No jaw pain, no neck pain, no fevers. No focal weakness. Positional headache. The headaches did not coincide with any changes in medication. No other focal neurologic deficits, associated symptoms, inciting events or modifiable factors.  Reviewed notes, labs and imaging from outside physicians, which showed:   04/07/2022: CT MAXILLOFACIAL WITHOUT CONTRAST reviewed images and agree   TECHNIQUE: Multidetector CT imaging of the maxillofacial structures was performed. Multiplanar CT image reconstructions were also generated.   RADIATION DOSE REDUCTION: This exam was performed according to the departmental dose-optimization program which includes  automated exposure control, adjustment of the mA and/or kV according to patient size and/or use of iterative reconstruction technique.   COMPARISON:  None Available.   FINDINGS: Osseous: No fracture or mandibular dislocation. No destructive process.   Orbits: Negative. No traumatic or inflammatory finding.   Sinuses: Opacified right sphenoethmoidal recess. Otherwise, clear sinuses.   Soft tissues: Negative.   Limited intracranial: No significant or unexpected finding.   IMPRESSION: Essentially clear sinuses.    Review of Systems: Patient complains of symptoms per HPI as well as the following symptoms headache. Pertinent negatives and positives per HPI. All others negative.   Social History   Socioeconomic History   Marital status: Married    Spouse name: Not on file   Number of children: Not on file   Years of education: Not on file   Highest education level: Not on file  Occupational History   Not on file  Tobacco Use   Smoking status: Former    Packs/day: 1.00    Years: 6.00    Total pack years: 6.00    Types: Cigarettes    Quit date: 54    Years since quitting: 52.0   Smokeless tobacco: Never  Vaping Use   Vaping Use: Never used  Substance and Sexual Activity   Alcohol use: Not Currently    Alcohol/week: 2.0 standard drinks of alcohol    Types: 2 Glasses of wine per week   Drug use: No   Sexual activity: Not on file  Other Topics Concern   Not on file  Social History  Narrative   Not on file   Social Determinants of Health   Financial Resource Strain: Not on file  Food Insecurity: Not on file  Transportation Needs: Not on file  Physical Activity: Not on file  Stress: Not on file  Social Connections: Not on file  Intimate Partner Violence: Not on file    Family History  Problem Relation Age of Onset   Colon cancer Neg Hx    Pancreatic cancer Neg Hx    Stomach cancer Neg Hx    Rectal cancer Neg Hx    Migraines Neg Hx    Headache Neg Hx     Sleep apnea Neg Hx     Past Medical History:  Diagnosis Date   Blind    Concussion 2016   no residual   Hypertension    Hypothyroidism    Retinitis pigmentosa    blind    Patient Active Problem List   Diagnosis Date Noted   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   Frontal headache 04/20/2022   New onset of headaches after age 63 04/20/2022    Past Surgical History:  Procedure Laterality Date   EYE SURGERY  1979   cataracts both eyes   HYDROCELE EXCISION Right 2007   HYDROCELE EXCISION Left 03/12/2020   Procedure: LEFT HYDROCELECTOMY ADULT;  Surgeon: Lucas Mallow, MD;  Location: [facility];  Service: Urology;  Laterality: Left;   left hydrocelectomy  02/01/2020   at [facility] surgical center   NASAL SINUS SURGERY  03/14/14   neuromal foot  1991   right   torn cartilage right knee  1988    Current Outpatient Medications  Medication Sig Dispense Refill   aspirin EC 81 MG tablet Take 81 mg by mouth daily. Swallow whole.     atorvastatin (LIPITOR) 20 MG tablet Take 20 mg by mouth daily.      diphenhydrAMINE (BENADRYL) 25 MG tablet Take 25 mg by mouth at bedtime as needed for allergies or sleep.     levothyroxine (SYNTHROID, LEVOTHROID) 75 MCG tablet Take 75 mcg by mouth daily before breakfast.     losartan-hydrochlorothiazide (HYZAAR) 100-12.5 MG tablet Take 1 tablet by mouth daily.     predniSONE (DELTASONE) 1 MG tablet Take 2 mg by mouth as needed (sinus).     No current facility-administered medications for this visit.    Allergies as of 08/10/2022   (No Known Allergies)    Vitals: BP (!) 125/59   Pulse 68   Ht 5\' 9"  (1.753 m)   Wt 181 lb 3.2 oz (82.2 kg)   BMI 26.76 kg/m  Last Weight:  Wt Readings from Last 1 Encounters:  08/10/22 181 lb 3.2 oz (82.2 kg)   Last Height:   Ht Readings from Last 1 Encounters:  08/10/22 5\' 9"  (1.753 m)     Exam: NAD, pleasant                  Speech:    Speech is normal; fluent and spontaneous with  normal comprehension.  Cognition:    The patient is oriented to person, place, and time;     recent and remote memory intact;     language fluent;    Cranial Nerves:    Legally blindTrigeminal sensation is intact and the muscles of mastication are normal. The face is symmetric. The palate elevates in the midline. Hearing intact. Voice is normal. Shoulder shrug is normal. The tongue has normal motion without fasciculations.   Coordination:  No dysmetria  Motor Observation:    No asymmetry, no atrophy, and no involuntary movements noted. Tone:    Normal muscle tone.     Strength:    Strength is V/V in the upper and lower limbs.      Sensation: intact to LT      Assessment/Plan:  77 y.o. blind male here as requested by Sueanne Margarita, DO for headache.  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 Patient did not improve on Emgality because his headaches likely due to OSA; sleep study came back with moderate to severe sleep apnea, AHI overall 27/hr, supine 51/hr and an O2 nadir of 85%. Explained this is likely the cause of his headaches, reviewed sequelae of untreated sleep apnea  - Continue ASA - Continue Statin - Continue to manage EVERY possible vascular risk factor including  - DEFINITELY need to be treated for sleep apnea, likely cause of headaches - he has allergies, congestion, will sent to Dr. Donneta Romberg to see if he can help as this may interfere with using cpap Incidental stroke on MRI: appears lacunar, CTA H&N unremarkable LDL < 70 goal discussed - restart statin (was stopped due to pcp concerned it may be the cause of headaches.  HTN: control it but too low blood pressure, [id number] systolic today but not orthostatic discuss with  pcp may be cause of dizziness (was fine today systolic 0000000) Continue ASA Echocardiogram completed no stroke risk 30-day heart monitor No significant arrhythmia  Sleep study - moderate to severe with nocturna; hypoxemia   Orders Placed This Encounter  Procedures   Ambulatory referral to Allergy   No orders of the defined types were placed in this encounter.    Cc: Shona Needles, DO  Sarina Ill, [facility] [address] [place], [place] [address]  Phone [phone] Fax [phone]  I spent more than 105 minutes of face-to-face and non-face-to-face time with patient on the  1. OSA (obstructive sleep apnea)   2. Congestion of nasal sinus   3. 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  This included previsit chart review, lab review, study review, order entry, electronic health record documentation, patient education on the different diagnostic and therapeutic options, counseling and coordination of care, risks and benefits of management, compliance, or risk factor reduction

## 2022-08-10 NOTE — Patient Instructions (Addendum)
- Continue ASA - Continue Statin - Continue to manage EVERY possible vascular risk factor including cholesterol  - DEFINITELY need to be treated for sleep apnea - congestion, needs allergies to be addressed to wear cpap, Dr. Mosetta Anis  Sleep Apnea Sleep apnea is a condition in which breathing pauses or becomes shallow during sleep. People with sleep apnea usually snore loudly. They may have times when they gasp and stop breathing for 10 seconds or more during sleep. This may happen many times during the night. Sleep apnea disrupts your sleep and keeps your body from getting the rest that it needs. This condition can increase your risk of certain health problems, including: Heart attack. Stroke. Obesity. Type 2 diabetes. Heart failure. Irregular heartbeat. High blood pressure. The goal of treatment is to help you breathe normally again. What are the causes?  The most common cause of sleep apnea is a collapsed or blocked airway. There are three kinds of sleep apnea: Obstructive sleep apnea. This kind is caused by a blocked or collapsed airway. Central sleep apnea. This kind happens when the part of the brain that controls breathing does not send the correct signals to the muscles that control breathing. Mixed sleep apnea. This is a combination of obstructive and central sleep apnea. What increases the risk? You are more likely to develop this condition if you: Are overweight. Smoke. Have a smaller than normal airway. Are older. Are male. Drink alcohol. Take sedatives or tranquilizers. Have a family history of sleep apnea. Have a tongue or tonsils that are larger than normal. What are the signs or symptoms? Symptoms of this condition include: Trouble staying asleep. Loud snoring. Morning headaches. Waking up gasping. Dry mouth or sore throat in the morning. Daytime sleepiness and tiredness. If you have daytime fatigue because of sleep apnea, you may be more likely to  have: Trouble concentrating. Forgetfulness. Irritability or mood swings. Personality changes. Feelings of depression. Sexual dysfunction. This may include loss of interest if you are male, or erectile dysfunction if you are male. How is this diagnosed? This condition may be diagnosed with: A medical history. A physical exam. A series of tests that are done while you are sleeping (sleep study). These tests are usually done in a sleep lab, but they may also be done at home. How is this treated? Treatment for this condition aims to restore normal breathing and to ease symptoms during sleep. It may involve managing health issues that can affect breathing, such as high blood pressure or obesity. Treatment may include: Sleeping on your side. Using a decongestant if you have nasal congestion. Avoiding the use of depressants, including alcohol, sedatives, and narcotics. Losing weight if you are overweight. Making changes to your diet. Quitting smoking. Using a device to open your airway while you sleep, such as: An oral appliance. This is a custom-made mouthpiece that shifts your lower jaw forward. A continuous positive airway pressure (CPAP) device. This device blows air through a mask when you breathe out (exhale). A nasal expiratory positive airway pressure (EPAP) device. This device has valves that you put into each nostril. A bi-level positive airway pressure (BIPAP) device. This device blows air through a mask when you breathe in (inhale) and breathe out (exhale). Having surgery if other treatments do not work. During surgery, excess tissue is removed to create a wider airway. Follow these instructions at home: Lifestyle Make any lifestyle changes that your health care provider recommends. Eat a healthy, well-balanced diet. Take steps to lose  weight if you are overweight. Avoid using depressants, including alcohol, sedatives, and narcotics. Do not use any products that contain  nicotine or tobacco. These products include cigarettes, chewing tobacco, and vaping devices, such as e-cigarettes. If you need help quitting, ask your health care provider. General instructions Take over-the-counter and prescription medicines only as told by your health care provider. If you were given a device to open your airway while you sleep, use it only as told by your health care provider. If you are having surgery, make sure to tell your health care provider you have sleep apnea. You may need to bring your device with you. Keep all follow-up visits. This is important. Contact a health care provider if: The device that you received to open your airway during sleep is uncomfortable or does not seem to be working. Your symptoms do not improve. Your symptoms get worse. Get help right away if: You develop: Chest pain. Shortness of breath. Discomfort in your back, arms, or stomach. You have: Trouble speaking. Weakness on one side of your body. Drooping in your face. These symptoms may represent a serious problem that is an emergency. Do not wait to see if the symptoms will go away. Get medical help right away. Call your local emergency services (911 in the [place] Do not drive yourself to the hospital. Summary Sleep apnea is a condition in which breathing pauses or becomes shallow during sleep. The most common cause is a collapsed or blocked airway. The goal of treatment is to restore normal breathing and to ease symptoms during sleep. This information is not intended to replace advice given to you by your health care provider. Make sure you discuss any questions you have with your health care provider. Document Revised: 02/18/2021 Document Reviewed: 06/20/2020 Elsevier Patient Education  [place].

## 2022-08-10 NOTE — Telephone Encounter (Signed)
Patient was here at [facility] for f/u appointment with Dr. Jaynee Eagles. Dr Jaynee Eagles gave sleep study result. Pt chose [facility] for DME . Informed pt of  insurance compliance . Wear CPAP machine 4+ hours nightly and come to all f/u appointments. Pt and wife expressed understanding. Will fax over orders this am to [place] a f/u appointment  with Frann Rider 09/2022

## 2022-08-10 NOTE — Telephone Encounter (Signed)
error 

## 2022-08-11 ENCOUNTER — Telehealth: Payer: Self-pay | Admitting: Neurology

## 2022-08-11 NOTE — Telephone Encounter (Signed)
Referral for Allergy fax to [place] [facility] to with Dr. Mosetta Anis.. Phone: [phone], Fax: [phone]

## 2022-08-18 ENCOUNTER — Telehealth: Payer: Self-pay | Admitting: Internal Medicine

## 2022-08-18 DIAGNOSIS — G479 Sleep disorder, unspecified: Secondary | ICD-10-CM

## 2022-08-18 NOTE — Telephone Encounter (Signed)
Patient's wife Frederick [name]) called to inform the clinic that, she wants  wants [place] to call the [facility] for prior authorization for her husband's medication for eszopiclone (LUNESTA) 2 MG tablet

## 2022-08-18 NOTE — Telephone Encounter (Signed)
Prior authorization in CoverMyMeds submitted for patient's Lunesta 2mg .

## 2022-08-19 MED ORDER — ESZOPICLONE 2 MG PO TABS
2.0000 mg | ORAL_TABLET | Freq: Every evening | ORAL | 1 refills | Status: DC
Start: 2022-08-19 — End: 2023-01-31

## 2022-08-19 NOTE — Telephone Encounter (Signed)
Your PA request has been closed. Thank you for the ePA request for Lunesta 2mg . We have reviewed the request and based on the information submitted, no Prior Authorization is needed at this time. There is a current approval from 07/19/2022 until 08/18/2023. The member can check the status of their Prior Approval online at any time by going to [email or web address] Thank you.

## 2022-08-19 NOTE — Addendum Note (Signed)
Addended by: Orlando Penner on: 08/19/2022 11:01 AM     Modules accepted: Orders

## 2022-08-19 NOTE — Addendum Note (Signed)
Addended by: Deeann Cree on: 08/19/2022 04:45 PM     Modules accepted: Orders

## 2022-08-27 ENCOUNTER — Telehealth: Payer: Self-pay | Admitting: Adult Health

## 2022-08-27 NOTE — Telephone Encounter (Signed)
Pt is calling. Stated he needs to talk with nurse about adjusting his air flow.

## 2022-08-30 NOTE — Telephone Encounter (Signed)
I called pt and he states that he has had 2 different type of harness and mask that was given to him and he notes that on back does ok, but if lying on side that he has mask leak.  I relayed that when looking on airview, his DOB needs to be changed, but only started using 08-25-2022.  Has used for 4 hour nightly. I relayed I will call [facility] and did (spoke to [place]) he will change dob, ;but also will call pt about mask/harness and the leak he is experiencing on his side.  I relayed my thanks.

## 2022-09-16 NOTE — Telephone Encounter (Signed)
Received fax from pt.  That he has returned his PAP equipment due to intolerance and unable to meet required insurance compliance.

## 2022-09-30 ENCOUNTER — Ambulatory Visit: Payer: Medicare HMO | Admitting: Adult Health

## 2022-10-18 ENCOUNTER — Encounter: Payer: Medicare HMO | Admitting: Adult Health

## 2022-10-26 ENCOUNTER — Other Ambulatory Visit (HOSPITAL_COMMUNITY): Payer: Self-pay | Admitting: Internal Medicine

## 2022-10-26 ENCOUNTER — Ambulatory Visit (HOSPITAL_COMMUNITY)
Admission: RE | Admit: 2022-10-26 | Discharge: 2022-10-26 | Disposition: A | Discharge status: Home / Self Care | Payer: Medicare HMO | Source: Ambulatory Visit | Attending: Internal Medicine | Admitting: Internal Medicine

## 2022-10-26 DIAGNOSIS — R600 Localized edema: Secondary | ICD-10-CM | POA: Diagnosis not present

## 2022-10-26 DIAGNOSIS — M79661 Pain in right lower leg: Secondary | ICD-10-CM | POA: Insufficient documentation

## 2022-10-26 DIAGNOSIS — I809 Phlebitis and thrombophlebitis of unspecified site: Secondary | ICD-10-CM

## 2022-12-06 ENCOUNTER — Other Ambulatory Visit: Payer: Self-pay

## 2022-12-06 DIAGNOSIS — E782 Mixed hyperlipidemia: Secondary | ICD-10-CM

## 2022-12-06 DIAGNOSIS — I159 Secondary hypertension, unspecified: Secondary | ICD-10-CM

## 2022-12-06 MED ORDER — LOSARTAN POTASSIUM 100 MG PO TABS
100.0000 mg | ORAL_TABLET | Freq: Every day | ORAL | 3 refills | Status: AC
Start: 2022-12-06 — End: ?

## 2022-12-06 MED ORDER — AMLODIPINE BESYLATE 5 MG PO TABS
5.0000 mg | ORAL_TABLET | Freq: Every day | ORAL | 1 refills | Status: DC
Start: 2022-12-06 — End: 2023-07-07

## 2022-12-06 MED ORDER — ATORVASTATIN CALCIUM 20 MG PO TABS
ORAL_TABLET | ORAL | 1 refills | Status: DC
Start: 2022-12-06 — End: 2023-07-07

## 2022-12-08 ENCOUNTER — Ambulatory Visit (INDEPENDENT_AMBULATORY_CARE_PROVIDER_SITE_OTHER): Payer: Medicare HMO

## 2022-12-08 ENCOUNTER — Ambulatory Visit: Payer: Medicare HMO | Admitting: Podiatry

## 2022-12-08 DIAGNOSIS — M76821 Posterior tibial tendinitis, right leg: Secondary | ICD-10-CM | POA: Diagnosis not present

## 2022-12-08 DIAGNOSIS — M79671 Pain in right foot: Secondary | ICD-10-CM

## 2022-12-08 MED ORDER — BETAMETHASONE SOD PHOS & ACET 6 (3-3) MG/ML IJ SUSP
3.0000 mg | Freq: Once | INTRAMUSCULAR | Status: AC
  Administered 2022-12-08: 3 mg via INTRA_ARTICULAR

## 2022-12-08 NOTE — Progress Notes (Signed)
   Chief Complaint  Patient presents with   Foot Pain    Patient came in to[place] for right foot and ankle pain and swelling, started 2 months ago, medial side of the ankle and leg, rate of pain 5 out of 10, sore, X-Rays done today TX: Mobic     HPI: 77 y.o. male presenting today as a new patient with his wife for evaluation of pain and tenderness associated to the medial aspect of the right ankle that has been ongoing for about 2 months now.  Idiopathic onset.  Patient has chronic lower extremity edema bilateral but he has noticed the pain especially in the mornings when getting out of bed he has pain and stiffness.  He has been taking meloxicam with moderate relief  Past Medical History:  Diagnosis Date   Blind    Concussion 2016   no residual   Hypertension    Hypothyroidism    Retinitis pigmentosa    blind    Past Surgical History:  Procedure Laterality Date   EYE SURGERY  1979   cataracts both eyes   HYDROCELE EXCISION Right 2007   HYDROCELE EXCISION Left 03/12/2020   Procedure: LEFT HYDROCELECTOMY ADULT;  Surgeon: Crista Elliot, MD;  Location: [facility];  Service: Urology;  Laterality: Left;   left hydrocelectomy  02/01/2020   at [facility]   NASAL SINUS SURGERY  03/14/14   neuromal foot  1991   right   torn cartilage right knee  1988    No Known Allergies   Physical Exam: General: The patient is alert and oriented x3 in no acute distress.  Dermatology: Skin is warm, dry and supple bilateral lower extremities.   Vascular: Palpable pedal pulses bilaterally. Capillary refill within normal limits.  Chronic lower extremity edema noted bilateral Neurological: Grossly intact via light touch  Musculoskeletal Exam: Muscle strength 5/5 all compartments.  There is some tenderness to palpation along the posterior tibial tendon right.  Radiographic Exam RT foot and ankle 12/08/2022:  Normal osseous mineralization. Joint spaces preserved.  No  fractures or osseous irregularities noted.  Impression: Negative  Assessment/Plan of Care: 1.  Posterior tibial tendinitis right  -Patient evaluated.  X-rays reviewed -Injection of 0.5 cc Celestone Soluspan injected along the posterior tibial tendon sheath right -Continue meloxicam 15 mg daily -Brace dispensed.  Wear daily.  Indications to facilitate and stabilize the ankle and promote healing -Return to clinic 4 weeks       Felecia Shelling, DPM [facility]  Dr. Felecia Shelling, DPM    [address] [place], [place] [address]                Office [phone]  Fax [phone]

## 2022-12-14 ENCOUNTER — Encounter: Payer: Self-pay | Admitting: Internal Medicine

## 2022-12-21 ENCOUNTER — Ambulatory Visit: Payer: Medicare Other

## 2022-12-21 ENCOUNTER — Encounter: Payer: Medicare Other | Admitting: Internal Medicine

## 2022-12-23 ENCOUNTER — Ambulatory Visit (INDEPENDENT_AMBULATORY_CARE_PROVIDER_SITE_OTHER): Payer: Medicare Other

## 2022-12-23 DIAGNOSIS — Z23 Encounter for immunization: Secondary | ICD-10-CM

## 2022-12-24 ENCOUNTER — Other Ambulatory Visit: Payer: Self-pay | Admitting: Podiatry

## 2022-12-24 DIAGNOSIS — M76821 Posterior tibial tendinitis, right leg: Secondary | ICD-10-CM

## 2022-12-24 DIAGNOSIS — M79671 Pain in right foot: Secondary | ICD-10-CM

## 2023-01-05 ENCOUNTER — Other Ambulatory Visit: Payer: Self-pay | Admitting: Internal Medicine

## 2023-01-05 ENCOUNTER — Ambulatory Visit (FREE_STANDING_LABORATORY_FACILITY): Payer: Medicare Other | Admitting: Internal Medicine

## 2023-01-05 ENCOUNTER — Encounter: Payer: Self-pay | Admitting: Internal Medicine

## 2023-01-05 ENCOUNTER — Ambulatory Visit: Payer: Medicare HMO | Admitting: Podiatry

## 2023-01-05 VITALS — BP 138/79 | HR 67 | Temp 98.0°F | Resp 14 | Ht 74.25 in | Wt 192.0 lb

## 2023-01-05 DIAGNOSIS — R002 Palpitations: Secondary | ICD-10-CM

## 2023-01-05 DIAGNOSIS — E782 Mixed hyperlipidemia: Secondary | ICD-10-CM

## 2023-01-05 DIAGNOSIS — H25013 Cortical age-related cataract, bilateral: Secondary | ICD-10-CM

## 2023-01-05 DIAGNOSIS — R7301 Impaired fasting glucose: Secondary | ICD-10-CM

## 2023-01-05 DIAGNOSIS — I10 Essential (primary) hypertension: Secondary | ICD-10-CM

## 2023-01-05 DIAGNOSIS — Z01818 Encounter for other preprocedural examination: Secondary | ICD-10-CM

## 2023-01-05 DIAGNOSIS — I2510 Atherosclerotic heart disease of native coronary artery without angina pectoris: Secondary | ICD-10-CM

## 2023-01-05 DIAGNOSIS — Z0000 Encounter for general adult medical examination without abnormal findings: Secondary | ICD-10-CM

## 2023-01-05 DIAGNOSIS — E559 Vitamin D deficiency, unspecified: Secondary | ICD-10-CM

## 2023-01-05 LAB — LAB USE ONLY - CBC WITH DIFFERENTIAL
Absolute Basophils: 0.02 10*3/uL (ref 0.00–0.08)
Absolute Eosinophils: 0.06 10*3/uL (ref 0.00–0.44)
Absolute Immature Granulocytes: 0.02 10*3/uL (ref 0.00–0.07)
Absolute Lymphocytes: 0.92 10*3/uL (ref 0.42–3.22)
Absolute Monocytes: 0.46 10*3/uL (ref 0.21–0.85)
Absolute Neutrophils: 4.78 10*3/uL (ref 1.10–6.33)
Absolute nRBC: 0 10*3/uL (ref <=0.00)
Basophils %: 0.3 %
Eosinophils %: 1 %
Hematocrit: 47.3 % (ref 37.6–49.6)
Hemoglobin: 15.5 g/dL (ref 12.5–17.1)
Immature Granulocytes %: 0.3 %
Lymphocytes %: 14.7 %
MCH: 31 pg (ref 25.1–33.5)
MCHC: 32.8 g/dL (ref 31.5–35.8)
MCV: 94.6 fL (ref 78.0–96.0)
MPV: 9.7 fL (ref 8.9–12.5)
Monocytes %: 7.3 %
Neutrophils %: 76.4 %
Platelet Count: 270 10*3/uL (ref 142–346)
Preliminary Absolute Neutrophil Count: 4.78 10*3/uL (ref 1.10–6.33)
RBC: 5 10*6/uL (ref 4.20–5.90)
RDW: 13 % (ref 11–15)
WBC: 6.26 10*3/uL (ref 3.10–9.50)
nRBC %: 0 /100 WBC (ref <=0.0)

## 2023-01-05 LAB — LIPID PANEL
Cholesterol / HDL Ratio: 3.4 Index
Cholesterol: 162 mg/dL (ref <=199)
HDL: 48 mg/dL (ref >=40)
LDL Calculated: 91 mg/dL (ref 0–129)
Triglycerides: 116 mg/dL (ref 34–149)
VLDL Calculated: 23 mg/dL (ref 10–40)

## 2023-01-05 LAB — ECG 12-LEAD, NO CHARGE
Atrial Rate: 60 {beats}/min
IHS MUSE NARRATIVE AND IMPRESSION: NORMAL
P Axis: -1 degrees
P-R Interval: 154 ms
Q-T Interval: 408 ms
QRS Duration: 82 ms
QTC Calculation (Bezet): 408 ms
R Axis: -22 degrees
T Axis: 64 degrees
Ventricular Rate: 60 {beats}/min

## 2023-01-05 LAB — TSH: TSH: 1.98 u[IU]/mL (ref 0.35–4.94)

## 2023-01-05 LAB — COMPREHENSIVE METABOLIC PANEL
ALT: 24 U/L (ref 0–55)
AST (SGOT): 26 U/L (ref 5–41)
Albumin/Globulin Ratio: 1.4 (ref 0.9–2.2)
Albumin: 4.2 g/dL (ref 3.5–5.0)
Alkaline Phosphatase: 59 U/L (ref 37–117)
Anion Gap: 8 (ref 5.0–15.0)
BUN: 15 mg/dL (ref 9–28)
Bilirubin, Total: 2.2 mg/dL — ABNORMAL HIGH (ref 0.2–1.2)
CO2: 29 mEq/L (ref 17–29)
Calcium: 10.2 mg/dL (ref 7.9–10.2)
Chloride: 107 mEq/L (ref 99–111)
Creatinine: 0.8 mg/dL (ref 0.5–1.5)
GFR: >60 mL/min/{1.73_m2} (ref >=60.0)
Globulin: 3.1 g/dL (ref 2.0–3.6)
Glucose: 114 mg/dL — ABNORMAL HIGH (ref 70–100)
Hemolysis Index: 5 Index
Potassium: 4.4 mEq/L (ref 3.5–5.3)
Protein, Total: 7.3 g/dL (ref 6.0–8.3)
Sodium: 144 mEq/L (ref 135–145)

## 2023-01-05 LAB — VITAMIN D, 25 OH, TOTAL: Vitamin D 25-OH, Total: 56 ng/mL (ref 30–100)

## 2023-01-05 NOTE — Progress Notes (Signed)
Subjective:      Date: 01/05/2023 11:53 AM   Patient ID: Frederick [name] is a 77 y.o. male.    Chief Complaint:  Chief Complaint   Patient presents with    Pre-op Exam     Right eye cataract surgery scheduled for 7/2 and left eye cataract surgery scheduled for 7/11 with Dr. Johnsie Kindred.       HPI:  Visit Type: Pre-operative Evaluation  Procedure: cataract surgery  Date of Surgery: July 2 rt eye July 11 left eye   Surgeon: Dr Stacie Acres   Chief Complaint: bilateral catracts, diminished   Recent Health (admits): no current complaints  Recent Health (denies): fever, fatigue, chest pain, cough, nausea, vomiting, diarrhea, dyspnea, dysuria, urinary frequency, abdominal pain, easy bruising, LE swelling, and poor exercise tolerance  Exercise Tolerance: 7 met ( i.e. swimming, tennis )  Surgical Risk Factors: No active cardiac or  pulmonary conditions  Prior Anesthesia: Patient reports no adverse reaction to anesthesia in the past.     Problem List:  Patient Active Problem List   Diagnosis    Hyperlipidemia    Insomnia    Tinnitus    Labile blood pressure    Dupuytren's contracture of both hands    Hyperacusis of both ears    Dupuytren's contracture of left hand    ERRONEOUS ENCOUNTER--DISREGARD    Lumbar spondylosis    Coronary artery disease involving native coronary artery of native heart without angina pectoris    Essential hypertension    DDD (degenerative disc disease), lumbar    Spinal stenosis of lumbar region without neurogenic claudication    S/P lumbar laminectomy    Malignant melanoma of neck       Current Medications:  Current Outpatient Medications   Medication Sig Dispense Refill    amLODIPine (NORVASC) 5 MG tablet Take 1 tablet (5 mg) by mouth daily 90 tablet 1    aspirin EC 81 MG EC tablet Take 1 tablet (81 mg) by mouth daily      atorvastatin (LIPITOR) 20 MG tablet TAKE 1 TABLET NIGHTLY 90 tablet 1    Cholecalciferol (Vitamin D3) 50 MCG (2000 UT) Tab Take by mouth daily      eszopiclone (LUNESTA) 2 MG  tablet Take 1 tablet (2 mg) by mouth nightly 90 tablet 1    losartan (COZAAR) 100 MG tablet Take 1 tablet (100 mg) by mouth daily 90 tablet 3     No current facility-administered medications for this visit.       Allergies:  Allergies   Allergen Reactions    Pollen Extract        Past Medical History:  Past Medical History:   Diagnosis Date    Diverticulitis     Elevated coronary artery calcium score 01/2020    H/O ABNORMAL CORONARY CALCIUM SCORE. 01/22/20 CARD NOTE FROM DR. BONTEMPO IN EPIC. 02/15/20 ECHO RESULTS PENDING IN EPIC.     Hearing loss     wears hearing aids, about 20% bilaterally     Heart murmur 01/22/2020    basal systolic murmur - ASYMPTOMATIC.    Hyperacusis of both ears     participating in audiotherapy     Hyperlipidemia     MANAGED WITH MEDS.    Hypertension      MANAGED WITH MEDS. H/O WHITE COAT SYNDROME.     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    Low back pain     HX    Melanoma 2022  Back of neck, removed by Dr. Francee Nodal (Derm)    Tinnitus 1993       Past Surgical History:  Past Surgical History:   Procedure Laterality Date    APPENDECTOMY (OPEN)  1973    COLONOSCOPY, DIAGNOSTIC (SCREENING)  02/2011    COLONOSCOPY, DIAGNOSTIC (SCREENING) N/A 08/24/2016    Procedure: COLONOSCOPY;  Surgeon: Lestine Mount, MD;  Location: [facility];  Service: Gastroenterology;  Laterality: N/A;  COLONOSCOPY    EXCISION, CYST  12/2006    back    KNEE ARTHROSCOPY Left 09/2011    LAMINECTOMY, POSTERIOR LUMBAR, DECOMPRESSION, LEVEL 2 Bilateral 03/06/2020    Procedure: BILATERAL L2-L4 LAMINECTOMY;  Surgeon: Jerilynn Mages, MD;  Location: [facility];  Service: Neurosurgery;  Laterality: Bilateral;  BILATERAL L2-L4 LAMINECTOMIES    RELEASE, 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    RELEASE, 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    ROTATOR CUFF REPAIR Left 09/2000    and shaving of bone spur       Family History:  Family History   Problem Relation Age of Onset    Alzheimer's disease Mother     Heart attack Father      Stroke Paternal Grandfather        Social History:  Social History     Socioeconomic History    Marital status: Married   Tobacco Use    Smoking status: Never    Smokeless tobacco: Never    Tobacco comments:     briefly tried pipes, cigars & cigarettes 1964-65; nothing since.   Vaping Use    Vaping status: Never Used   Substance and Sexual Activity    Alcohol use: Yes     Alcohol/week: 7.0 - 14.0 standard drinks of alcohol     Types: 7 - 14 Standard drinks or equivalent per week     Comment: 2 glasses per day    Drug use: Yes     Types: Marijuana     Social Determinants of Health     Financial Resource Strain: Low Risk  (01/03/2023)    Overall Financial Resource Strain (CARDIA)     Difficulty of Paying Living Expenses: Not hard at all   Food Insecurity: No Food Insecurity (01/03/2023)    Hunger Vital Sign     Worried About Running Out of Food in the Last Year: Never true     Ran Out of Food in the Last Year: Never true   Transportation Needs: No Transportation Needs (01/03/2023)    PRAPARE - Therapist, art (Medical): No     Lack of Transportation (Non-Medical): No   Physical Activity: Sufficiently Active (01/03/2023)    Exercise Vital Sign     Days of Exercise per Week: 4 days     Minutes of Exercise per Session: 40 min   Stress: No Stress Concern Present (01/03/2023)    [employer] of Occupational Health - Occupational Stress Questionnaire     Feeling of Stress : Only a little   Social Connections: Moderately Isolated (01/03/2023)    Social Connection and Isolation Panel [NHANES]     Frequency of Communication with Friends and Family: Three times a week     Frequency of Social Gatherings with Friends and Family: Once a week     Attends Religious Services: Never     Database administrator or Organizations: No     Attends Banker Meetings: Never     Marital Status: Married   Catering manager  Violence: Not At Risk (01/03/2023)    Humiliation, Afraid, Rape, and Kick  questionnaire     Fear of Current or Ex-Partner: No     Emotionally Abused: No     Physically Abused: No     Sexually Abused: No   Housing Stability: Low Risk  (01/03/2023)    Housing Stability Vital Sign     Unable to Pay for Housing in the Last Year: No     Number of Places Lived in the Last Year: 1     Unstable Housing in the Last Year: No        The following sections were reviewed this encounter by the provider:          Vitals:  BP 138/79 (BP Site: Right arm, Patient Position: Sitting, Cuff Size: Medium)   Pulse 67   Temp 98 F (36.7 C) (Skin)   Resp 14   Wt 87.1 kg (192 lb)   SpO2 98%   BMI 24.65 kg/m     ROS:  General/Constitutional:           Denies Chills.  Denies Fatigue.  Denies Fever.       Ophthalmologic:           Denies Blurred vision.       ENT:           Denies Nasal Discharge.  Denies Sinus pain.  Denies Sore throat.       Endocrine:           Denies Polydipsia.  Denies Polyuria.       Respiratory:           Denies Cough.  Denies Orthopnea.  Denies Shortness of breath.  Denies Wheezing.       Cardiovascular:           Denies Chest pain.  Denies Chest pain with exertion.  Denies Palpitations.  Denies Swelling in hands/feet.       Gastrointestinal:           Denies Abdominal pain.  Denies Constipation.  Denies Diarrhea.  Denies Nausea.  Denies Vomiting.       Hematology:           Denies Easy bruising.  Denies Easy Bleeding.       Genitourinary:           Denies Blood in urine.  Denies Painful urination.       Peripheral Vascular:           Denies Pain/cramping in legs after exertion.  Denies Painful extremities.       Skin:           Denies Rash.       Neurologic:           Denies Dizziness.  Denies Pre-Syncope.  Denies Tingling/Numbness.       Psychiatric:           Denies Anxiety.  Denies Depressed mood.       Objective:     Examination:   General Examination:  GENERAL APPEARANCE: alert, in no acute distress, well developed, well nourished, oriented to time, place, and person.   HEAD:  normal appearance.   EYES: extraocular movement intact (EOMI), pupils equal, round, reactive to light and accommodation, sclera anicteric.   EARS: tympanic membranes normal bilaterally, external canals normal .   NOSE: normal nasal mucosa, nares patent.   ORAL CAVITY: normal oropharynx.   THROAT: no erythema, no exudate.   NECK/THYROID: neck supple,  carotid pulse 2+ bilaterally, no lymphadenopathy, no thyromegaly.   SKIN: no rashes.   HEART: S1, S2 normal, no murmurs, rubs, gallops, regular rate and rhythm.   LUNGS: normal effort / no distress, normal breath sounds, clear to auscultation bilaterally, no wheezes, rales, rhonchi.   ABDOMEN: bowel sounds present, no hepatosplenomegaly, soft, nontender, nondistended.   EXTREMITIES: no clubbing, cyanosis, or edema.   PERIPHERAL PULSES: 2+ dorsalis pedis, 2+ posterior tibial.   NEUROLOGIC: nonfocal, cranial nerves 2-12 grossly intact, deep tendon reflexes 2+ symmetrical, normal strength, tone and reflexes, sensory exam intact.   PSYCH: alert, oriented, cognitive function intact.    Assessment:     1. Cortical age-related cataract of both eyes  Planned surgery for July 2 and July 11 cleared for surgery     2. Pre-op evaluation    - Urinalysis with Microscopic Exam  - ECG 12 lead (No Charge)  - CBC with Differential  - CMP      Plan:   Pre-Operative Evaluation:  Surgery Specific Risk:  Low (endoscopic, superficial, breast, opthalmologic, minor orthopedic)  - Pt has no active cardiovascular symptoms and has good functional capacity.  Pt cleared for surgery with acceptable risk.      [name], MD  11:24 PM 01/05/2023    [facility]  [address]  [place], [place] [address]  P) [phone]  F) [phone]  [email or web address]

## 2023-01-05 NOTE — Progress Notes (Signed)
Annual labs and EKG    [name], MD  8:08 AM 01/05/2023    [facility]  [address]  [place], [place] [address]  P) [phone]  F) [phone]  [email or web address]

## 2023-01-05 NOTE — Progress Notes (Signed)
Testing:  Labs were drawn from left [facility] without difficulty. Aseptic technique utilized. No complications noted. Dressing applied. Patient tolerated well. Urine collected at this visit. Patient instructed on clean-catch technique per protocol. Patient verbalized understanding.    The below labs were sent to [facility] refrigerated;  one (1) Gold/SST Tube(s) - collection : centrifuged solely.  one (1) Lavender (EDTA)Tube(s) - collection : whole blood sent.  One (1) Urinalysis Tube

## 2023-01-06 LAB — LAB USE ONLY - URINALYSIS WITH MICROSCOPIC EXAM
Urine Bilirubin: NEGATIVE
Urine Blood: NEGATIVE
Urine Glucose: NEGATIVE
Urine Ketones: NEGATIVE mg/dL
Urine Leukocyte Esterase: NEGATIVE
Urine Nitrite: NEGATIVE
Urine Specific Gravity: 1.026 (ref 1.001–1.035)
Urine Urobilinogen: NORMAL mg/dL (ref 0.2–2.0)
Urine pH: 6 (ref 5.0–8.0)

## 2023-01-06 NOTE — Progress Notes (Signed)
Dear Burgess Amor:     It was a pleasure meeting with you. I wanted to review with you the results of your recent lab tests:    - TSH normal 0- 4  - Electrolytes and kidney function are normal  - Liver functions are normal  - Fasting blood sugar is normal with no evidence of diabetes  - Blood count is normal with no evidence of infection or anemia  - Urinalysis is normal  - Vit D normal > 30     - Total cholesterol is 162 (goal < 200) triglycerides are 116 (goal < 150), HDL or good cholesterol is 48 (goal >40 ) LDL or bad cholesterol is 91 (numeric goal depends on overall cardiac risk estimation)    Please let me know if you have any concerns or questions.    Best regards,    [name], MD  9:53 AM 01/06/2023    [facility]  [address]  [place], [place] [address]  P) [phone]  F) [phone]  [email or web address]

## 2023-01-06 NOTE — Progress Notes (Signed)
I successfully faxed patient's CBC and CMP results, pre-op clearance note, and EKG reading (with my name whited out prior due to accidentally signing it) to Dr. Aram Beecham office at [phone].

## 2023-01-24 ENCOUNTER — Encounter: Payer: Self-pay | Admitting: Dermatology

## 2023-01-25 ENCOUNTER — Ambulatory Visit: Payer: Medicare Other

## 2023-01-25 ENCOUNTER — Encounter: Payer: Medicare Other | Admitting: Internal Medicine

## 2023-01-31 ENCOUNTER — Other Ambulatory Visit: Payer: Self-pay | Admitting: Internal Medicine

## 2023-01-31 DIAGNOSIS — G479 Sleep disorder, unspecified: Secondary | ICD-10-CM

## 2023-02-01 MED ORDER — ESZOPICLONE 2 MG PO TABS
2.0000 mg | ORAL_TABLET | Freq: Every evening | ORAL | 1 refills | Status: DC
Start: 2023-02-01 — End: 2023-07-07

## 2023-02-24 ENCOUNTER — Ambulatory Visit: Payer: Medicare Other

## 2023-02-24 ENCOUNTER — Ambulatory Visit (INDEPENDENT_AMBULATORY_CARE_PROVIDER_SITE_OTHER): Payer: Medicare Other | Admitting: Internal Medicine

## 2023-02-24 ENCOUNTER — Encounter: Payer: Self-pay | Admitting: Internal Medicine

## 2023-02-24 VITALS — BP 156/84 | HR 60 | Temp 97.9°F | Resp 12 | Ht 73.0 in | Wt 195.3 lb

## 2023-02-24 DIAGNOSIS — M720 Palmar fascial fibromatosis [Dupuytren]: Secondary | ICD-10-CM

## 2023-02-24 DIAGNOSIS — I10 Essential (primary) hypertension: Secondary | ICD-10-CM

## 2023-02-24 DIAGNOSIS — Z0000 Encounter for general adult medical examination without abnormal findings: Secondary | ICD-10-CM

## 2023-02-24 DIAGNOSIS — E782 Mixed hyperlipidemia: Secondary | ICD-10-CM

## 2023-02-24 DIAGNOSIS — H9313 Tinnitus, bilateral: Secondary | ICD-10-CM

## 2023-02-24 NOTE — Progress Notes (Signed)
Chief Complaint   Patient presents with    Annual Exam       History of Present Illness    Frederick [name] is a 77 y.o. male who is here for an [place] annual physical examination.    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, [address] [place], [place] and [place] - Scientific laboratory technician - Carmie Kanner to be able to fih back to their spawn in, [place] , [employer] to the [employer] Sept 2020  16 counties with election deniers will block certification of the - 50 states and small states [place] and [place], [place] wildly over represented    PROBLEMS  Cataract surgery July 2 and 11 overall color vision is much improved  At home Audio with Tinnitus   1.Tinnitus - Tinnitus he has hearing aids that are allowing for the tinnitus to be dulled out. 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 - Dr Francee Nodal   3.Insomnia - Lunesta 2 mg needs a refill  4.Improved Back pain - stiffness - xrays show L4-L5 Jumped off a Bannister at a young age - glucosamine, Dr Verl Dicker did discectomy and now he feels pain controlled with stretch and massage, he is able to golf again  5.HTN controlled on Losaratan 100 mg , White coat HTN at home BP are > 140 recommend to monitor and share BP log in 2 weeks- he has known White Coat Hypertension   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 Veggie sausage  Lunch - dinner, sandwhiches  Snack sugar snaps with hummus  Dinner - homecooked meal, [address] - salmon, shrimp filet 2 x a month   Soda - none  Juice - none  Coffee - de-cafe coffee  Water - seltzer  Drink a little less wine    EXERCISE: 4 x a week every other day but with heat and his hands he has been recuperating, he has been in the pool   Treadmill 30- 45 minutes he has been less compliant   Total Gym 20 mins    SLEEP: 11 - 4 to urinate , 6-7 am gets up, he does have about 6 hours of sleep, every night with Lunesta 2 mg QHS with in 10-15 mins    STRESS: self care with attitude and exercise  Current Events     CANCER:No family history of uterine, cervical, ovary, colon, prostate  PGM and PAunt - radical mastectomy    HEALTH MAINTENANCE:  Immunizations-   Covid 4 shots total - Moderna - muscle soreness  Tdap last done 2015   Flu vaccine 04/2022  ShingRx-  Shingles x 2     COLON:4 years will repeat next year - 2012 1 polyp and 2018 and then repeat in 5 years repeated July 2023 no polyps recommend to continue or switch cologaurd   WGN:FAOZH   SKIN:dermatologist   Opthalmologist - slow growing cataract, specialist who is watching the retina  Dental : UPTD had some dental work in place            [place] [facility]    Frederick [name] is a 77 y.o. male who presents today for the following Medicare Wellness Visit:  []  Initial Preventive Physical Exam (IPPE) - "Welcome to Medicare" preventive visit (Vision Screening required)   []  Annual Wellness Visit - Initial  [x]  Annual Wellness Visit - Subsequent       Health Risk Assessment:   During the past month, how would you rate your general health?:  Very Good  Which of the following tasks can you do without assistance - drive or take  the bus alone; shop for groceries or clothes; prepare your own meals; do your own housework/laundry; handle your own finances/pay bills; eat, bathe or get around your home?: Drive or take the bus alone, Handle your own finances/pay bills, Shop for groceries or clothes, Eat, bathe, dress or get around your home, Prepare your own meals, Do your own housework/laundry  Which of the following problems have you been bothered by in the past month - dizzy when standing up; problems using the phone; feeling tired or fatigued; moderate or severe body pain?: None of these  Do you exercise for about 20 minutes 3 or more days per week?:Yes  During the past month was someone available to help if you needed and wanted help?  For example, if you felt nervous, lonely, got sick and had to stay in bed, needed someone to talk to, needed help with daily chores or needed help just taking care of yourself.: Yes  Do you always wear a seat belt?: Yes  Do you have any trouble taking medications the way you have been told to take them?: Yes  Have you been given any information that can help you with keeping track of your medications?: Yes  Do you have trouble paying for your medications?: No  Have you been given any information that can help you with hazards in your house, such as scatter rugs, furniture, etc?: Yes  Do you feel unsteady when standing or walking?: Yes  Do you worry about falling?: No  Have you fallen two or more times in the past year?: No  Did you suffer any injuries from your falls in the past year?: No     Care Team:   Patient Care Team:  Tressia Miners, MD as PCP - General (Internal Medicine)  Jerilynn Mages, MD as Consulting Physician (Neurological Surgery)  Pess, Janet Berlin, MD as Consulting Physician (Hand Surgery)  Clinton Sawyer Gelene Mink, MD as Consulting Physician (Cardiology)  Rulon Sera, OOD as Consulting Physician (Optometry)  Chilton Si Madelin Rear, MD as Consulting Physician (Dermatology)  Kelli Hope, MD PhD as  Consulting Physician (Cardiology)      Hospitalizations:   Hospitalization within past year: [x]  No  []  Yes     Diagnosis:      Screenings:       12/17/2021 06/13/2022 02/24/2023   Ambulatory Screenings   Falls Risk: De [place] more than 2 times in past year N  N   Falls Risk: Suffer any injuries? N  N   Depression: PHQ2 Total Score 0 0 0   Depression: PHQ9 Total Score 0 0 0           Substance Use Disorder Screen:  In the past year, how often have you used the following?  1) Alcohol (For men, 5 or more drinks a day. For women, 4 or more drinks a day)  [x]  Never []  Once or Twice []  Monthly []  Weekly []  Daily or Almost Daily  2) Tobacco Products  [x]  Never []  Once or Twice []  Monthly []  Weekly []  Daily or Almost Daily  3) Prescription Drugs for Non-Medical Reasons  [x]  Never []  Once or Twice []  Monthly []  Weekly []  Daily or Almost Daily  4) Illegal Drugs  [x]  Never []  Once or Twice []  Monthly []  Weekly []  Daily or Almost Daily     Male Alcohol Risk Factor Screening >48yrs:   1)  Do you average more than 1 drink per night or more than 7 drinks a week:                no  2)  On any one occasion in the past three months, have you have had more than 4        drinks containing alcohol: no         Functional Ability/Level of Safety:   Falls Risk/Home Safety Assessment:  ( see HRA and Screenings sections for additional assessment)  Home Safety: [x]  Stair handrails  [x]  Skid-resistant rugs/remove throw rugs   [x]  Grab bars  [x]  Clear pathways between rooms  [x]  Proper lighting stairs/ bathrooms/bedrooms  Get Up and Go (optional):  [x]   <20 secs  []   >20 secs    []   High risk for falls - Home Safety/Falls Risk Precautions reviewed with pt/family    Hearing Assessment:  Concerns for hearing loss: [x]  Yes  []   No  Hearing aids:   []   Right  []   Left  [x]   Bilateral   []   None  Whisper Test (optional):  []  Normal  []   Slightly decreased  [x]   Significantly decreased  Has hearing aids Biaterally  Exercise:  Frequency:  []   No formal  exercise  []   1-2x/wk  []   3-4x/wk  []   >4x/wk  Duration:  []   15-30 mins/day  []   30-45 mins/day  []   45+ mins/day  Intensity:  []   Light  []   Moderate  []   Heavy        Activities of Daily Living:   ADL's Independent Minimal  Assistance Moderate  Assistance Total   Assistance   Bathing [x]  []  []  []    Dressing [x]  []  []  []    Mobility   [x]  []  []  []    Transfer [x]  []  []  []    Eating [x]  []  []  []    Toileting [x]  []  []  []      IADL's Independent Minimal  Assistance Moderate  Assistance Total   Assistance   Phone [x]  []  []  []    Housekeeping [x]  []  []  []    Laundry [x]  []  []  []    Transportation [x]  []  []  []    Medications [x]  []  []  []    Finances [x]  []  []  []       ADL assistance: [x]  No assistance needed  []  Spouse  []  Sibling  []  Son   []  Daughter []  Children  []  Home Health Aide []  Other:       Advance Care  Planning:   Discussion of Advance Directives:   [x]  Advance Directive in chart  []  Advance Directive not in chart - requested to provide []  No Advance Directive.  Form Provided  []  No Advance Directive.  Pt declines. []  Not addressed today  []  Other:     Exam:   BP 156/84 (BP Site: Left arm, Patient Position: Sitting, Cuff Size: Medium)   Pulse 60   Temp 97.9 F (36.6 C) (Skin)   Resp 12   Ht 1.854 m (6\' 1" )   Wt 88.6 kg (195 lb 4.8 oz)   SpO2 98%   BMI 25.77 kg/m      Physical Exam          Evaluation of Cognitive Function:   Mood/affect: [x]  Appropriate  []   Other:   Appearance: [x]  Neatly groomed  [x]  Adequately nourished  []  Other:  Family member/caregiver input: []  Present - no concerns  []   Not present in room  []  Present - concerns:    Cognitive Assessment:  Mini-Cog Result (three word registration- banana, sunrise, chair / clock drawing):   [x]   > 3 points - negative screen for dementia   []  3 recalled words - negative screen for dementia   []  1-2 recalled words and normal clock draw - negative for cognitive impairment   []  1-2 recalled words and abnormal clock draw - positive for cognitive impairment    []  0 recalled words - positive for cognitive impairment         History:   Problem List[1]   Medical History[2]  Past Surgical History:   Procedure Laterality Date    APPENDECTOMY (OPEN)  1973    COLONOSCOPY, DIAGNOSTIC (SCREENING)  02/2011    COLONOSCOPY, DIAGNOSTIC (SCREENING) N/A 08/24/2016    Procedure: COLONOSCOPY;  Surgeon: Lestine Mount, MD;  Location: [id number];  Service: Gastroenterology;  Laterality: N/A;  COLONOSCOPY    EXCISION, CYST  12/2006    back    KNEE ARTHROSCOPY Left 09/2011    LAMINECTOMY, POSTERIOR LUMBAR, DECOMPRESSION, LEVEL 2 Bilateral 03/06/2020    Procedure: BILATERAL L2-L4 LAMINECTOMY;  Surgeon: Jerilynn Mages, MD;  Location: [facility];  Service: Neurosurgery;  Laterality: Bilateral;  BILATERAL L2-L4 LAMINECTOMIES    RELEASE, 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    RELEASE, 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    ROTATOR CUFF REPAIR Left 09/2000    and shaving of bone spur     Allergies[3]   Medications Taking[4]  Social History[5]   Family History   Problem Relation Age of Onset    Alzheimer's disease Mother     Heart attack Father     Stroke Paternal Grandfather            ===================================================================    Additional Documentation:                      Review of Systems  CONST: No weight change, no fevers/chills or sweats, fatigue, muscle aches  EYES: No blurry vision, double vision, loss of peripheral vision  EARS: No hearing loss, tinnitus, pain or discharge.   NOSE: No congestion, runny nose or bloody nose  MOUTH: No sore throat, oral lesions, difficulty swallowing, no change in character of voice  NECK: No swollen glands, stiffness or pain  CV: No chest pain, palpitations, leg swelling  RESP: No shortness of breath, cough, wheeze, asthma  GI: No n/v, diarrhea, constipation, abd pain, heartburn, no change in caliber/color of stool  MALE GU: No dysuria, frequency, hematuria, nocturia,  testicular changes, penile discharge, erectile  dysfunction  MSK:  No joint or muscle pain, swelling or weakness  Dupuytren contractures b/l hands   SKIN:   No rashes lumps, sores or concerning moles  ENDO:  No heat/cold intolerance, polyuria, polydipsia, polyphagia  HEME:  No bruising/bleeding, swollen glands  NEURO:  No headache, lightneadedness, dizziness, loss of consciousness, numbness/tingling, weakness  PSYCH:  No depressed mood, anhedonia, anxiety    Physical Exam:  BP 156/84 (BP Site: Left arm, Patient Position: Sitting, Cuff Size: Medium)   Pulse 60   Temp 97.9 F (36.6 C) (Skin)   Resp 12   Ht 1.854 m (6\' 1" )   Wt 88.6 kg (195 lb 4.8 oz)   SpO2 98%   BMI 25.77 kg/m    Wt Readings from Last 3 Encounters:   02/24/23 88.6 kg (195 lb 4.8 oz)   01/05/23 87.1 kg (192 lb)   06/14/22 88.5 kg (195 lb)     GEN: well developed well nourished male alert and appropriate in no apparent distress  HENT: tympanic membrane normal bilaterally, no nasal congestion, oropharynx normal w no exudate or erythema  EYES: PERRL, EOMI, no pallor or scleral icterus, normal conjunctiva  NECK: supple, no thyromegaly or nodules appreciated  LYMPH: no cervical or supraclavicular lymphadenopathy appreciated  CV: RRR, no m/r/g.  No LE edema.  2+ radial pulses present and equal.  RESP: CTAB, normal effort, no rhonchi or rales  GI: soft, nontender/ nondistended, NABS, no rebound or guarding, no evident hepatosplenomegaly  GU: normal external genitalia no masses or hernia evident or palpable  SKIN: warm, dry.  no visible rashes or concerning moles  MSK: Normal bulk and tone, normal strength 5/5 and sensation UE / LE  NEURO: PERRLA, EOMI, face symmetric, hearing intact to voice, palate raise, shoulder shrug and neck flexion intact, tongue midline. Moves all extremities.  PSYCH: normal mood and affect    No visits with results within 1 Week(s) from this visit.   Latest known visit with results is:   Office Visit on 01/05/2023   Component Date Value Ref Range Status    Ventricular  Rate 01/05/2023 60  BPM Preliminary    Atrial Rate 01/05/2023 60  BPM Preliminary    P-R Interval 01/05/2023 154  ms Preliminary    QRS Duration 01/05/2023 82  ms Preliminary    Q-T Interval 01/05/2023 408  ms Preliminary    QTC Calculation (Bezet) 01/05/2023 408  ms Preliminary    P Axis 01/05/2023 -1  degrees Preliminary    R Axis 01/05/2023 -22  degrees Preliminary    T Axis 01/05/2023 64  degrees Preliminary    IHS MUSE NARRATIVE AND IMPRESSION 01/05/2023    Preliminary                    Value:NORMAL SINUS RHYTHM  NORMAL ECG  WHEN COMPARED WITH ECG OF 27-Apr-2022 08:33,  QUESTIONABLE CHANGE IN QRS AXIS  NONSPECIFIC T WAVE ABNORMALITY NO LONGER EVIDENT IN INFERIOR LEADS      Vitamin D 25-OH, Total 01/05/2023 56  30 - 100 ng/mL Final    TSH 01/05/2023 1.98  0.35 - 4.94 uIU/mL Final    Cholesterol 01/05/2023 162  <=199 mg/dL Final    Triglycerides 01/05/2023 116  34 - 149 mg/dL Final    HDL 16/04/9603 48  >=40 mg/dL Final    LDL Calculated 01/05/2023 91  0 - 129 mg/dL Final    VLDL Calculated 01/05/2023 23  10 -  40 mg/dL Final    Cholesterol / HDL Ratio 01/05/2023 3.4  Index Final    Glucose 01/05/2023 114 (H)  70 - 100 mg/dL Final    BUN 40/98/1191 15  9 - 28 mg/dL Final    Creatinine 47/82/9562 0.8  0.5 - 1.5 mg/dL Final    Sodium 13/02/6577 144  135 - 145 mEq/L Final    Potassium 01/05/2023 4.4  3.5 - 5.3 mEq/L Final    Chloride 01/05/2023 107  99 - 111 mEq/L Final    CO2 01/05/2023 29  17 - 29 mEq/L Final    Calcium 01/05/2023 10.2  7.9 - 10.2 mg/dL Final    Anion Gap 46/96/2952 8.0  5.0 - 15.0 Final    GFR 01/05/2023 >60.0  >=60.0 mL/min/1.73 m2 Final    AST (SGOT) 01/05/2023 26  5 - 41 U/L Final    ALT 01/05/2023 24  0 - 55 U/L Final    Alkaline Phosphatase 01/05/2023 59  37 - 117 U/L Final    Albumin 01/05/2023 4.2  3.5 - 5.0 g/dL Final    Protein, Total 01/05/2023 7.3  6.0 - 8.3 g/dL Final    Globulin 84/13/2440 3.1  2.0 - 3.6 g/dL Final    Albumin/Globulin Ratio 01/05/2023 1.4  0.9 - 2.2 Final     Bilirubin, Total 01/05/2023 2.2 (H)  0.2 - 1.2 mg/dL Final    Hemolysis Index 01/05/2023 5  Index Final    Urine Color 01/05/2023 Straw  Colorless, Straw or Yellow Final    Urine Clarity 01/05/2023 Clear  Clear, Hazy Final    Urine Specific Gravity 01/05/2023 1.026  1.001 - 1.035 Final    Urine pH 01/05/2023 6.0  5.0 - 8.0 Final    Urine Leukocyte Esterase 01/05/2023 Negative  Negative Final    Urine Nitrite 01/05/2023 Negative  Negative Final    Urine Protein 01/05/2023 100= 2+ (A)  Negative Final    Urine Glucose 01/05/2023 Negative  Negative Final    Urine Ketones 01/05/2023 Negative  Negative mg/dL Final    Urine Urobilinogen 01/05/2023 Normal  0.2 - 2.0 mg/dL Final    Urine Bilirubin 01/05/2023 Negative  Negative Final    Urine Blood 01/05/2023 Negative  Negative Final    RBC, UA 01/05/2023 6-10 (A)  0 - 5 /hpf Final    Urine WBC 01/05/2023 0-5  None Seen, 0-5 /hpf Final    Urine Squamous Epithelial Cells 01/05/2023 0-5  0 - 5 /hpf Final    Urine Mucus 01/05/2023 Present (A)  None Final    WBC 01/05/2023 6.26  3.10 - 9.50 x10 3/uL Final    Hemoglobin 01/05/2023 15.5  12.5 - 17.1 g/dL Final    Hematocrit 05/22/2535 47.3  37.6 - 49.6 % Final    Platelet Count 01/05/2023 270  142 - 346 x10 3/uL Final    MPV 01/05/2023 9.7  8.9 - 12.5 fL Final    RBC 01/05/2023 5.00  4.20 - 5.90 x10 6/uL Final    MCV 01/05/2023 94.6  78.0 - 96.0 fL Final    MCH 01/05/2023 31.0  25.1 - 33.5 pg Final    MCHC 01/05/2023 32.8  31.5 - 35.8 g/dL Final    RDW 64/40/3474 13  11 - 15 % Final    nRBC % 01/05/2023 0.0  <=0.0 /100 WBC Final    Absolute nRBC 01/05/2023 0.00  <=0.00 x10 3/uL Final    Preliminary Absolute Neutrophil Co* 01/05/2023 4.78  1.10 - 6.33 x10 3/uL Final  Neutrophils % 01/05/2023 76.4  Not Established % Final    Lymphocytes % 01/05/2023 14.7  Not Established % Final    Monocytes % 01/05/2023 7.3  Not Established % Final    Eosinophils % 01/05/2023 1.0  Not Established % Final    Basophils % 01/05/2023 0.3  Not  Established % Final    Immature Granulocytes % 01/05/2023 0.3  Not Established % Final    Absolute Neutrophils 01/05/2023 4.78  1.10 - 6.33 x10 3/uL Final    Absolute Lymphocytes 01/05/2023 0.92  0.42 - 3.22 x10 3/uL Final    Absolute Monocytes 01/05/2023 0.46  0.21 - 0.85 x10 3/uL Final    Absolute Eosinophils 01/05/2023 0.06  0.00 - 0.44 x10 3/uL Final    Absolute Basophils 01/05/2023 0.02  0.00 - 0.08 x10 3/uL Final    Absolute Immature Granulocytes 01/05/2023 0.02  0.00 - 0.07 x10 3/uL Final       Fitness Evaluation: Recommend [place] [facility]  Audiology test:  reviewed results with patient declined   Vision test:  reviewed results with patient   EKG in muse  InBody: Reviewed results of InBody with patient - notable for Wt 195.3 SKMM 88.0 BFM 37.9 BMI 25.11 PBF 19.4% VF 8      Assessment/Plan:  1. Medicare annual wellness visit, subsequent  Preventive health exam    UTD DDS and ophthalmology  Dermatology annual skin cancer screen performed UPTD  h/o Mohs BCC removed  Podiatry if applicable  - screening and monitoring labs obtained and sent to [facility] and reviewed with patient   - UTD on vaccines including annual flu shot. Tdap due UPTD   COVID booster-- due Oct 2024  - safety, dietary concerns, and exercise concerns discussed  -Colon cancer screen due 5 years  -PSA discussed    2. Essential hypertension  White coat HTN   Reviewed at home log'  controlled    3. Mixed hyperlipidemia  The 10-year ASCVD risk score (Arnett DK, et al., 2019) is: 40.6%    Values used to calculate the score:      Age: 71 years      Sex: Male      Is Non-Hispanic African American: No      Diabetic: No      Tobacco smoker: No      Systolic Blood Pressure: 156 mmHg      Is BP treated: Yes      HDL Cholesterol: 48 mg/dL      Total Cholesterol: 162 mg/dL    On statin   4. Tinnitus of both ears  Controlled with at home therapy - device in controlled study     5. Dupuytren's contracture of left hand  F/u with hand surgeon revision done earlier this year      [name], MD  1:14 PM 02/24/2023    [facility]  [address]  [place], [place] [address]  P) [phone]  F) [phone]  [email or web address]                   [1]   Patient Active Problem List  Diagnosis    Hyperlipidemia    Insomnia    Tinnitus    Labile blood pressure    Dupuytren's contracture of both hands    Hyperacusis of both ears    Dupuytren's contracture of left hand    ERRONEOUS ENCOUNTER--DISREGARD    Lumbar spondylosis    Coronary artery disease involving native coronary artery of  native heart without angina pectoris    Essential hypertension    DDD (degenerative disc disease), lumbar    Spinal stenosis of lumbar region without neurogenic claudication    S/P lumbar laminectomy   [2]   Past Medical History:  Diagnosis Date    Diverticulitis     Elevated coronary artery calcium score 01/2020    H/O ABNORMAL CORONARY CALCIUM SCORE. 01/22/20 CARD NOTE FROM DR. BONTEMPO IN EPIC. 02/15/20 ECHO RESULTS PENDING IN EPIC.     Hearing loss     wears hearing aids, about 20% bilaterally     Heart murmur 01/22/2020    basal systolic murmur - ASYMPTOMATIC.    Hyperacusis of both ears     participating in audiotherapy     Hyperlipidemia     MANAGED WITH MEDS.    Hypertension      MANAGED WITH MEDS. H/O WHITE COAT SYNDROME.     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    Low back pain     HX    Melanoma 2022    Back of neck, removed by Dr. Francee Nodal (Derm)    Tinnitus 1993   [3]   Allergies  Allergen Reactions    Pollen Extract    [4]   Outpatient Medications Marked as Taking for the 02/24/23 encounter (Office Visit) with Tressia Miners, MD   Medication Sig Dispense Refill    amLODIPine (NORVASC) 5 MG tablet Take 1 tablet (5 mg) by mouth daily 90 tablet 1    aspirin EC 81 MG EC tablet Take 1 tablet (81 mg) by mouth daily      atorvastatin (LIPITOR) 20 MG tablet TAKE 1 TABLET NIGHTLY 90 tablet 1    Cholecalciferol (Vitamin D3) 50 MCG (2000 UT) Tab Take by mouth daily      eszopiclone (LUNESTA) 2 MG tablet  Take 1 tablet (2 mg) by mouth nightly 90 tablet 1    losartan (COZAAR) 100 MG tablet Take 1 tablet (100 mg) by mouth daily 90 tablet 3   [5]   Social History  Tobacco Use    Smoking status: Never     Passive exposure: Never    Smokeless tobacco: Never    Tobacco comments:     briefly tried pipes, cigars & cigarettes 1964-65; nothing since.   Vaping Use    Vaping status: Never Used   Substance Use Topics    Alcohol use: Yes     Alcohol/week: 2.0 standard drinks of alcohol     Types: 2 Glasses of wine per week     Comment: 2 glasses per day    Drug use: Yes     Types: Marijuana     Comment: Typically uses twice daily

## 2023-02-24 NOTE — Progress Notes (Signed)
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    Vision Test: Patient deferred since currently receiving frequent vision testing with optometrist and recently had bilateral cataract surgeries.    Fitness Evaluation:   Weight  2024 = 195.3 lbs  2023 = 191.9 lbs    Body Fat as percentage: In men, over 25% is obese, 20-25% is higher than normal, 16-20% is healthy / normal, <16% or under is considered lean / ideal.  2024 = 19.4%  2023 = 18.1%    Visceral Fat Level: Visceral fat is an indicator of the estimated fat surrounding the internal organs in the abdomen. High visceral fat levels increase the risk for developing heart disease and Type 2 Diabetes. A level of under 10 is recommended for optimal health.  2024 = 8  2023 = 7    Current Exercise Program:  InBody results reviewed in detail and compared to prior results.    Over age 43: Discussed the importance of strength training. Strength training provides a greater boost to metabolism than cardio. Through strength training, you can combat the natural decline of lean muscle mass that occurs around age 25 and beyond. People over age 54 lose 8% of their muscle mass every decade.    Exercise: It is important to perform cardio activities at least 3 - 5 days weekly for 30 minutes or longer. Take the stairs at work, park in the farthest spot, walk at lunch (find ways to fit in exercise during the day even if it's only for short times).    Nutrition / Dietary Goals:  Importance of consuming 4 - 6 small frequent meals throughout the days as opposed to 2 large meals.  Healthy choices include lean protein, vegetables, and less carbohydrates. Healthy snacks to keep on hand during work hours to consume at 10:00am and 4:00pm include cheese stick, nuts, greek yogurt, hummus, vegetables, cottage cheese, apple, berries, protein shake, protein bar.    Mind-Body-Connection:  Sleep Habits: 6-7 hours per night  on average    Immunizations:  Immunization record is up to date. [employer] ([employer]) was checked for any missing immunization record.    Health Maintenance:  Colonoscopy: 01/2022

## 2023-03-07 ENCOUNTER — Ambulatory Visit: Payer: Medicare HMO | Admitting: Podiatry

## 2023-03-07 DIAGNOSIS — M76821 Posterior tibial tendinitis, right leg: Secondary | ICD-10-CM

## 2023-03-07 MED ORDER — MELOXICAM 15 MG PO TABS: 15.0000 mg | ORAL_TABLET | Freq: Every day | ORAL | 1 refills | Status: DC

## 2023-03-07 NOTE — Progress Notes (Signed)
   Chief Complaint  Patient presents with   posterior tibial tendinitis    right foot flare up, still having pain few weeks after injection    HPI: 77 y.o. male presenting today for follow-up evaluation of right medial ankle pain.  Patient states that it is not necessarily painful but he experiences burning sensation to the medial aspect of the ankle.  No pain when he first gets out of bed in the mornings denies a history of injury.  He says that the injection he received only helped for about 2 weeks.  He does not feel any instability or pain during walking or gait, but intermittent burning even when he is laying down.  He does have a history of chronic lower extremity edema.  He says that he is seeing his PCP tomorrow to address the edema to the lower extremities which she suspects may be due to his high blood pressure  Past Medical History:  Diagnosis Date   Blind    Concussion 2016   no residual   Hypertension    Hypothyroidism    Retinitis pigmentosa    blind    Past Surgical History:  Procedure Laterality Date   EYE SURGERY  1979   cataracts both eyes   HYDROCELE EXCISION Right 2007   HYDROCELE EXCISION Left 03/12/2020   Procedure: LEFT HYDROCELECTOMY ADULT;  Surgeon: Crista Elliot, MD;  Location: [facility];  Service: Urology;  Laterality: Left;   left hydrocelectomy  02/01/2020   at [facility]   NASAL SINUS SURGERY  03/14/14   neuromal foot  1991   right   torn cartilage right knee  1988    No Known Allergies   Physical Exam: General: The patient is alert and oriented x3 in no acute distress.  Dermatology: Skin is warm, dry and supple bilateral lower extremities.   Vascular: Palpable pedal pulses bilaterally. Capillary refill within normal limits.  Chronic lower extremity edema noted bilateral Neurological: Grossly intact via light touch.  No shooting sensation or Tinel sign with percussion of the tibial nerve  Musculoskeletal  Exam: Muscle strength 5/5 all compartments.  There is some tenderness to palpation along the posterior tibial tendon right.  Patient relates the pain is more of a burning type sensation  Radiographic Exam RT foot and ankle 12/08/2022:  Normal osseous mineralization. Joint spaces preserved.  No fractures or osseous irregularities noted.  Impression: Negative  Assessment/Plan of Care: 1.  Posterior tibial tendinitis right 2.  Possible neuritis right  -Patient evaluated.   -Prescription for meloxicam 15 mg daily -Patient has follow-up with PCP tomorrow.  He believes that some of the edema may be creating the burning sensation.  He says that the swelling in the legs exacerbates the burning sensation -Continue wearing good supportive tennis shoes and sneakers.  Ankle brace was offered but ultimately declined -Return to clinic as needed      Felecia Shelling, DPM [facility]  Dr. Felecia Shelling, DPM    [address] [place], [place] [address]                Office [phone]  Fax [phone]

## 2023-03-10 ENCOUNTER — Encounter: Payer: Self-pay | Admitting: Internal Medicine

## 2023-03-25 ENCOUNTER — Ambulatory Visit (HOSPITAL_COMMUNITY)
Admission: RE | Admit: 2023-03-25 | Discharge: 2023-03-25 | Disposition: A | Discharge status: Home / Self Care | Payer: Medicare HMO | Source: Ambulatory Visit | Attending: Cardiology | Admitting: Cardiology

## 2023-03-25 ENCOUNTER — Other Ambulatory Visit (HOSPITAL_COMMUNITY): Payer: Self-pay | Admitting: Internal Medicine

## 2023-03-25 DIAGNOSIS — R2243 Localized swelling, mass and lump, lower limb, bilateral: Secondary | ICD-10-CM

## 2023-04-01 ENCOUNTER — Other Ambulatory Visit: Payer: Self-pay | Admitting: Internal Medicine

## 2023-04-01 DIAGNOSIS — R590 Localized enlarged lymph nodes: Secondary | ICD-10-CM

## 2023-04-06 ENCOUNTER — Ambulatory Visit
Admission: RE | Admit: 2023-04-06 | Discharge: 2023-04-06 | Disposition: A | Discharge status: Home / Self Care | Payer: Medicare HMO | Source: Ambulatory Visit | Attending: Internal Medicine | Admitting: Internal Medicine

## 2023-04-06 DIAGNOSIS — R590 Localized enlarged lymph nodes: Secondary | ICD-10-CM

## 2023-04-06 MED ORDER — IOPAMIDOL (ISOVUE-300) INJECTION 61%
100.0000 mL | Freq: Once | INTRAVENOUS | Status: AC | PRN
  Administered 2023-04-06: 100 mL via INTRAVENOUS

## 2023-04-25 ENCOUNTER — Ambulatory Visit: Payer: Medicare Other

## 2023-04-28 ENCOUNTER — Telehealth: Payer: Self-pay | Admitting: Internal Medicine

## 2023-04-28 ENCOUNTER — Ambulatory Visit (INDEPENDENT_AMBULATORY_CARE_PROVIDER_SITE_OTHER): Payer: Medicare Other

## 2023-04-28 DIAGNOSIS — Z23 Encounter for immunization: Secondary | ICD-10-CM

## 2023-04-28 NOTE — Telephone Encounter (Signed)
-----   Message from Katherine Roan sent at 04/27/2023  8:00 AM EDT -----  Regarding: Orders for Tomorrow  Hi,     This patient is coming in for COVID vaccine tomorrow. Can you please enter orders?    Thanks!    [facility] [address]  [place], [place] [address]  [phone]  [phone]    [image:untitled image]

## 2023-04-28 NOTE — Progress Notes (Signed)
Covid -19 vaccination was administered IM in left deltoid. Detailed information and VIS was given to the patient. Patient left in good condition.

## 2023-05-06 ENCOUNTER — Telehealth: Payer: Self-pay | Admitting: Internal Medicine

## 2023-05-06 DIAGNOSIS — Z23 Encounter for immunization: Secondary | ICD-10-CM

## 2023-05-06 NOTE — Telephone Encounter (Signed)
-----   Message from Katherine Roan sent at 05/06/2023 12:12 PM EDT -----  Regarding: Vaccine Order  Hi,     This patient is coming in for a Flu vaccine next week. Can you please enter orders?    Thanks!    [facility] [address]  [place], [place] [address]  [phone]  [phone]

## 2023-05-09 NOTE — Progress Notes (Signed)
[facility]    CC: Total body skin examination (TBSE)    HISTORY OF PRESENT ILLNESS  Interval- TBSE was with Dr.Green ~5-6 months ago, at which time no suspicious lesions were identified warranting biopsy.      Frederick [name] is 77 y.o. male with history of melanoma of right posterior neck. Patient's wife noted irregular brown lesion on right posterior neck prompting biopsy of the lesion with dermatology. This showed a non mitogenic 0.71mm melanoma right neck s/p WLE with Dr Imogene Burn October 2022.      Oncology History   Malignant melanoma of neck (Resolved)   04/09/2021 Initial Diagnosis    Malignant melanoma of neck     04/09/2021 Biopsy    Biopsy of  right posterior neck showed 0.3 mm thick, non-ulcerated melanoma with 0 mitoses/mm2. Margins were involved by in situ only.        04/29/2021 Procedure    WLE with 1 cm margins with Dr. Ethelene Browns.     04/12/2022 Cancer Staged    Staging form: Melanoma of the Skin, AJCC 8th Edition  - Pathologic: Stage IA (pT1a, pN0, cM0) - Signed by Alden Benjamin, MD on 04/12/2022       Patient denies any other personal history of melanoma. History of BCC right forehead s/p excision. At time of melanoma WLE, a BCC on the right postauricular was excised by Dr. Imogene Burn.   States history of moderate outdoor sun exposure. Denies tanning bed use. Patient denies any family history of melanoma or pancreatic cancer. Retired Oncologist on [employer]  Current Outpatient Medications   Medication Sig Dispense Refill    amLODIPine (NORVASC) 5 MG tablet Take 1 tablet (5 mg) by mouth daily 90 tablet 1    aspirin EC 81 MG EC tablet Take 1 tablet (81 mg) by mouth daily      atorvastatin (LIPITOR) 20 MG tablet TAKE 1 TABLET NIGHTLY 90 tablet 1    Cholecalciferol (Vitamin D3) 50 MCG (2000 UT) Tab Take by mouth daily      eszopiclone (LUNESTA) 2 MG tablet Take 1 tablet (2 mg) by mouth nightly 90 tablet 1    losartan (COZAAR) 100 MG tablet Take 1 tablet (100 mg) by mouth  daily 90 tablet 3     No current facility-administered medications for this visit.     Allergies   Allergen Reactions    Pollen Extract      Past Medical History:   Diagnosis Date    Diverticulitis     Elevated coronary artery calcium score 01/2020    H/O ABNORMAL CORONARY CALCIUM SCORE. 01/22/20 CARD NOTE FROM DR. BONTEMPO IN EPIC. 02/15/20 ECHO RESULTS PENDING IN EPIC.     Hearing loss     wears hearing aids, about 20% bilaterally     Heart murmur 01/22/2020    basal systolic murmur - ASYMPTOMATIC.    Hyperacusis of both ears     participating in audiotherapy     Hyperlipidemia     MANAGED WITH MEDS.    Hypertension      MANAGED WITH MEDS. H/O WHITE COAT SYNDROME.     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    Low back pain     HX    Melanoma 2022    Back of neck, removed by Dr. Francee Nodal (Derm)    Tinnitus 1993     Past Surgical History:   Procedure Laterality Date    APPENDECTOMY (OPEN)  1973    COLONOSCOPY, DIAGNOSTIC (SCREENING)  02/2011    COLONOSCOPY, DIAGNOSTIC (SCREENING) N/A 08/24/2016    Procedure: COLONOSCOPY;  Surgeon: Lestine Mount, MD;  Location: [facility];  Service: Gastroenterology;  Laterality: N/A;  COLONOSCOPY    EXCISION, CYST  12/2006    back    KNEE ARTHROSCOPY Left 09/2011    LAMINECTOMY, POSTERIOR LUMBAR, DECOMPRESSION, LEVEL 2 Bilateral 03/06/2020    Procedure: BILATERAL L2-L4 LAMINECTOMY;  Surgeon: Jerilynn Mages, MD;  Location: [facility];  Service: Neurosurgery;  Laterality: Bilateral;  BILATERAL L2-L4 LAMINECTOMIES    RELEASE, 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   Vaping Use    Vaping status: Never Used   Substance and Sexual Activity    Alcohol use: Yes     Alcohol/week: 2.0 standard drinks of alcohol     Types: 2 Glasses of wine per week     Comment: 2 glasses per day    Drug use: Yes     Types: Marijuana     Comment: Typically uses twice daily     Social Determinants of Health     Financial Resource Strain: Low Risk  (05/09/2023)    Overall Financial Resource Strain (CARDIA)     Difficulty of Paying Living Expenses: Not hard at all   Food Insecurity: No Food Insecurity (05/09/2023)    Hunger Vital Sign     Worried About Running Out of Food in the Last Year: Never true     Ran Out of Food in the Last Year: Never true   Transportation Needs: No Transportation Needs (05/09/2023)    PRAPARE - Therapist, art (Medical): No     Lack of Transportation (Non-Medical): No   Physical Activity: Sufficiently Active (05/09/2023)    Exercise Vital Sign     Days of Exercise per Week: 4 days     Minutes of Exercise per Session: 60 min   Stress: Stress Concern Present (05/09/2023)    [employer] of Occupational Health - Occupational Stress Questionnaire     Feeling of Stress : To some extent   Social Connections: Unknown (05/09/2023)    Social Connection and Isolation Panel [NHANES]     Frequency of Communication with Friends and Family: Patient declined     Frequency of Social Gatherings with Friends and Family: Once a week     Attends Religious Services: Never     Database administrator or Organizations: No     Attends Banker Meetings: Never     Marital Status: Married   Catering manager Violence: Not At Risk (05/09/2023)    Humiliation, Afraid, Rape, and Kick questionnaire     Fear of Current or Ex-Partner: No     Emotionally Abused: No     Physically Abused:  No     Sexually Abused: No   Housing Stability: Low Risk  (05/09/2023)    Housing Stability  Vital Sign     Unable to Pay for Housing in the Last Year: No     Number of Times Moved in the Last Year: 0     Homeless in the Last Year: No     REVIEW OF SYSTEMS  Const - no fever no chills   Integ - see HPI  Objective:   BP 191/82   Pulse 67   Temp 97 F (36.1 C) (Temporal)   Resp 16   Ht 1.854 m (6\' 1" )   Wt 91.1 kg (200 lb 14.4 oz)   SpO2 97%   BMI 26.51 kg/m    multiple brown macules, size 2-32mm, < 30 nevi, distributed primarily on the trunk. Extensive photodamage and rhytids. The following anatomic skin sites are examined with findings recorded:  Head including face-   Right postauricular skin just over the mastoid prominence- well healed linear scar,no evidence of recurrence (BCC excised October 2022)  Right forehead- scar, no evidence of recurrence (site of BCC)   Neck- Right posterior neck- well healed "V" shaped scar, no evidence of recurrence (Stage IA melanoma excised October 2022)  Chest including breasts, and axillae- no lesions of concern  Abdomen- no lesions of concern  Buttocks- no lesions of concern  Back- no lesions of concern  Right upper extremity- no lesions of concern  Left upper extremity-no lesions of concern  Right lower extremity- no lesions of concern  Left lower extremity- no lesions of concern  Examination of lymph node basins is completed, including the H and N, axilla and groin. There is no lymphadenopathy.    Pathology reports   Patient Name: Frederick [name]  Date of Birth: [birth year]  Accession #: [id number]  Date of Service: 04/09/2021     Diagnosis  Neck, right posterior:  Malignant melanoma, superficial spreading type, associated with a minute dermal nevus     Specimen type: Shave biopsy  Clark's level: III (3)  Approximate maximum thickness: 0.3 mm  Growth phase: Vertical  Mitotic rate: 0 per mm^2  Ulceration: Not identified  Peripheral margin: Involved by situ  Deep margin: Involved by situ via adnexa  Regression: Not identified  Lymphovascular invasion: Not  identified  Perineural invasion: Not identified  Microscopic satellites: Not identified  Lymphocytic host response: Not identified  Pathologic stage: pT1a (pT AJCC 8th Ed.) (dtt)     Gross  Irregular, 7x4 mm, tan granular surface, inked. Bisected, totally submitted. (Aa)  Assessment & Plan:     1. 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 He is aware that while the risk of recurrence is low, the sites of recurrence include within the scar, between the scar and the lymph nodes, in the lymph nodes and rarely can present with distant spread to lung, liver, brain and other sites. We reviewed potential worrisome signs and symptoms of recurrence.    2. 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 Multiple benign nevi-Reassured patient of benign appearance    4. Skin cancer screening - no lesions concerning to warrant biopsy today       5. Longitudinal follow up alternate with Dr Chilton Si 6 months until 2027 ;  NEXT VISIT Dr Chilton Si April 2025; I will see him Oct 2025.      Coyt Govoni S. Ruel Favors, MD, FAAD  Medical Director, [facility]  [facility], [facility]  Associate Professor of Medicine, [employer]  [address], [place], [place] [address]  T [phone]  F [phone]  [email or web address]    CC: Dr. Francee Nodal (Derm)

## 2023-05-13 ENCOUNTER — Ambulatory Visit (INDEPENDENT_AMBULATORY_CARE_PROVIDER_SITE_OTHER): Payer: Medicare Other

## 2023-05-13 DIAGNOSIS — Z23 Encounter for immunization: Secondary | ICD-10-CM

## 2023-05-23 ENCOUNTER — Ambulatory Visit: Payer: Medicare Other | Attending: Dermatology | Admitting: Dermatology

## 2023-05-23 ENCOUNTER — Encounter: Payer: Self-pay | Admitting: Dermatology

## 2023-05-23 VITALS — BP 191/82 | HR 67 | Temp 97.0°F | Resp 16 | Ht 73.0 in | Wt 200.9 lb

## 2023-05-23 DIAGNOSIS — Z1283 Encounter for screening for malignant neoplasm of skin: Secondary | ICD-10-CM | POA: Insufficient documentation

## 2023-05-23 DIAGNOSIS — Z85820 Personal history of malignant melanoma of skin: Secondary | ICD-10-CM | POA: Insufficient documentation

## 2023-07-03 ENCOUNTER — Other Ambulatory Visit: Payer: Self-pay | Admitting: Podiatry

## 2023-07-07 ENCOUNTER — Other Ambulatory Visit: Payer: Self-pay | Admitting: Internal Medicine

## 2023-07-07 DIAGNOSIS — I159 Secondary hypertension, unspecified: Secondary | ICD-10-CM

## 2023-07-07 DIAGNOSIS — E782 Mixed hyperlipidemia: Secondary | ICD-10-CM

## 2023-07-07 DIAGNOSIS — G479 Sleep disorder, unspecified: Secondary | ICD-10-CM

## 2023-07-12 ENCOUNTER — Encounter: Payer: Self-pay | Admitting: Internal Medicine

## 2023-08-01 DIAGNOSIS — I10 Essential (primary) hypertension: Secondary | ICD-10-CM | POA: Diagnosis not present

## 2023-08-01 DIAGNOSIS — E039 Hypothyroidism, unspecified: Secondary | ICD-10-CM | POA: Diagnosis not present

## 2023-08-01 DIAGNOSIS — Z8673 Personal history of transient ischemic attack (TIA), and cerebral infarction without residual deficits: Secondary | ICD-10-CM | POA: Diagnosis not present

## 2023-08-01 DIAGNOSIS — H3552 Pigmentary retinal dystrophy: Secondary | ICD-10-CM | POA: Diagnosis not present

## 2023-08-29 ENCOUNTER — Ambulatory Visit (INDEPENDENT_AMBULATORY_CARE_PROVIDER_SITE_OTHER): Payer: Medicare Other | Admitting: Internal Medicine

## 2023-08-29 VITALS — BP 164/84 | HR 67 | Temp 98.0°F | Resp 16 | Ht 74.0 in | Wt 193.0 lb

## 2023-08-29 DIAGNOSIS — H9313 Tinnitus, bilateral: Secondary | ICD-10-CM

## 2023-08-29 DIAGNOSIS — B360 Pityriasis versicolor: Secondary | ICD-10-CM

## 2023-08-29 DIAGNOSIS — I10 Essential (primary) hypertension: Secondary | ICD-10-CM

## 2023-08-29 DIAGNOSIS — E782 Mixed hyperlipidemia: Secondary | ICD-10-CM

## 2023-08-29 MED ORDER — KETOCONAZOLE 2 % EX CREA
TOPICAL_CREAM | Freq: Every day | CUTANEOUS | 2 refills | Status: DC
Start: 2023-08-29 — End: 2024-03-01

## 2023-08-29 MED ORDER — KETOCONAZOLE 2 % EX SHAM
MEDICATED_SHAMPOO | CUTANEOUS | 2 refills | Status: DC
Start: 2023-08-29 — End: 2024-03-01

## 2023-08-29 NOTE — Progress Notes (Signed)
 Chief Complaint   Patient presents with    Follow-up       HPI    Frederick [name] is a 78 y.o. male here for evaluation and treatment of the following concerns.   6 month f/u care   BP 164/84 in the office White Coat HTN, at home 123/70s   He is doing well but they he and [name] are stressed about the current politics and not sure how to communicate and support each other   He has a rash at the belt lik=ne and flanks and mottled coloring on back and chest - tinea versicolor    The patient's past medical history, surgical history, medications and allergies were reviewed.    Allergies[1]    Medications  Current Medications[2]      Review of Systems   Constitutional: Negative for chills and fever.   HEENT: No HA or blurry vision, no ST, no cough or congestion   Respiratory: Negative for cough and shortness of breath.    Cardiovascular: Negative for chest pain, palpitations and leg swelling.   Gastrointestinal: Negative for diarrhea, heartburn, nausea and vomiting. .   Neurological: Negative for dizziness and headaches.   Psychiatric/Behavioral: Negative for depression. The patient is not nervous/anxious.     Physical Exam:  BP 164/84 (BP Site: Left arm, Patient Position: Sitting, Cuff Size: Medium)   Pulse 67   Temp 98 F (36.7 C)   Resp 16   Ht 1.88 m (6' 2)   Wt 87.5 kg (193 lb)   SpO2 99%   BMI 24.78 kg/m    Wt Readings from Last 3 Encounters:   08/29/23 87.5 kg (193 lb)   05/23/23 91.1 kg (200 lb 14.4 oz)   02/24/23 88.6 kg (195 lb 4.8 oz)     GEN: well developed well nourished male alert and appropriate in no apparent distress  HENT: no nasal congestion, oropharynx normal w no exudate or erythema, PERRL,  normal conjunctiva  CV: RRR, no m/r/g.  No LE edema.  2+ radial pulses present and equal.  RESP: CTAB, normal effort, no rhonchi or rales  GI: soft, nontender/ nondistended, NABS, no rebound or guarding, no evident hepatosplenomegaly  MSK: Normal bulk and tone, normal strength 5/5 and sensation UE  / LE    A/P  1. Mixed hyperlipidemia (Primary)  Cont statin an dlow chol diet    2. Essential hypertension  Managed at hime BPO monitor reviewed noraml pressures   No cp or pressure or sob     3 Tinea versicolor  Topical shampoo and cream discussed  - ketoconazole  (NIZORAL ) 2 % shampoo; Apply topically once a week  Dispense: 120 mL; Refill: 2  - ketoconazole  (NIZORAL ) 2 % cream; Apply topically daily  Dispense: 60 g; Refill: 2Risks & benefits of the new medication(s) were explained to the patient, who appeared to understand and agrees to the treatment plan.      Pinkey Shove, MD  3:04 PM 08/29/2023     [facility]  [address]  [place], [place] [address]  P) [phone]  F) [phone]  [email or web address]                 [1]   Allergies  Allergen Reactions    Pollen Extract    [2]   Current Outpatient Medications   Medication Sig Dispense Refill    amLODIPine  (NORVASC ) 5 MG tablet TAKE 1 TABLET DAILY 90 tablet 1    atorvastatin  (LIPITOR) 20 MG  tablet TAKE 1 TABLET NIGHTLY 90 tablet 1    Cholecalciferol (Vitamin D3) 50 MCG (2000 UT) Tab Take by mouth daily      eszopiclone  (LUNESTA ) 2 MG tablet TAKE 1 TABLET NIGHTLY 90 tablet 1    losartan  (COZAAR ) 100 MG tablet Take 1 tablet (100 mg) by mouth daily 90 tablet 3    aspirin  EC 81 MG EC tablet Take 1 tablet (81 mg) by mouth daily (Patient not taking: Reported on 08/29/2023)       No current facility-administered medications for this visit.

## 2023-09-05 ENCOUNTER — Encounter: Payer: Self-pay | Admitting: Internal Medicine

## 2023-09-07 DIAGNOSIS — M79645 Pain in left finger(s): Secondary | ICD-10-CM | POA: Diagnosis not present

## 2023-09-07 DIAGNOSIS — M13832 Other specified arthritis, left wrist: Secondary | ICD-10-CM | POA: Diagnosis not present

## 2023-10-11 ENCOUNTER — Encounter: Payer: Self-pay | Admitting: Internal Medicine

## 2023-10-27 DIAGNOSIS — R053 Chronic cough: Secondary | ICD-10-CM | POA: Diagnosis not present

## 2023-11-23 ENCOUNTER — Ambulatory Visit (INDEPENDENT_AMBULATORY_CARE_PROVIDER_SITE_OTHER)

## 2023-11-23 DIAGNOSIS — Z23 Encounter for immunization: Secondary | ICD-10-CM

## 2023-11-23 NOTE — Progress Notes (Signed)
COVID-19 vaccination was administered IM in left deltoid. Detailed information and VIS was given to the patient. Patient left in good condition.     Instructed patient to apply a cold compress to injection site if soreness, redness, itching or swelling occurs and contact the office if these symptoms persists after 5 days.

## 2023-12-21 ENCOUNTER — Ambulatory Visit (INDEPENDENT_AMBULATORY_CARE_PROVIDER_SITE_OTHER): Payer: Medicare Other | Admitting: Cardiovascular Disease

## 2023-12-24 DIAGNOSIS — E039 Hypothyroidism, unspecified: Secondary | ICD-10-CM | POA: Diagnosis not present

## 2023-12-24 DIAGNOSIS — I10 Essential (primary) hypertension: Secondary | ICD-10-CM | POA: Diagnosis not present

## 2023-12-28 DIAGNOSIS — Z8673 Personal history of transient ischemic attack (TIA), and cerebral infarction without residual deficits: Secondary | ICD-10-CM | POA: Diagnosis not present

## 2023-12-28 DIAGNOSIS — L989 Disorder of the skin and subcutaneous tissue, unspecified: Secondary | ICD-10-CM | POA: Diagnosis not present

## 2023-12-28 DIAGNOSIS — H3552 Pigmentary retinal dystrophy: Secondary | ICD-10-CM | POA: Diagnosis not present

## 2023-12-28 DIAGNOSIS — I10 Essential (primary) hypertension: Secondary | ICD-10-CM | POA: Diagnosis not present

## 2023-12-28 DIAGNOSIS — E039 Hypothyroidism, unspecified: Secondary | ICD-10-CM | POA: Diagnosis not present

## 2023-12-29 ENCOUNTER — Ambulatory Visit (INDEPENDENT_AMBULATORY_CARE_PROVIDER_SITE_OTHER): Admitting: Cardiovascular Disease

## 2023-12-29 ENCOUNTER — Encounter (INDEPENDENT_AMBULATORY_CARE_PROVIDER_SITE_OTHER): Payer: Self-pay | Admitting: Cardiovascular Disease

## 2023-12-29 VITALS — BP 187/86 | HR 67 | Ht 73.0 in | Wt 196.0 lb

## 2023-12-29 DIAGNOSIS — Z136 Encounter for screening for cardiovascular disorders: Secondary | ICD-10-CM

## 2023-12-29 DIAGNOSIS — E782 Mixed hyperlipidemia: Secondary | ICD-10-CM

## 2023-12-29 DIAGNOSIS — I2510 Atherosclerotic heart disease of native coronary artery without angina pectoris: Secondary | ICD-10-CM

## 2023-12-29 DIAGNOSIS — I10 Essential (primary) hypertension: Secondary | ICD-10-CM

## 2023-12-29 DIAGNOSIS — R011 Cardiac murmur, unspecified: Secondary | ICD-10-CM

## 2023-12-29 DIAGNOSIS — Z79899 Other long term (current) drug therapy: Secondary | ICD-10-CM

## 2023-12-29 MED ORDER — ASPIRIN 81 MG PO TBEC
81.0000 mg | DELAYED_RELEASE_TABLET | Freq: Every day | ORAL | 3 refills | Status: AC
Start: 2023-12-29 — End: ?

## 2023-12-29 MED ORDER — COQ10 200 MG PO CAPS
200.0000 mg | ORAL_CAPSULE | Freq: Every day | ORAL | Status: AC
Start: 2023-12-29 — End: ?

## 2023-12-29 MED ORDER — ATORVASTATIN CALCIUM 40 MG PO TABS
ORAL_TABLET | ORAL | 3 refills | Status: DC
Start: 2023-12-29 — End: 2024-04-05

## 2023-12-29 NOTE — Progress Notes (Signed)
 [facility]     Chief Complaint   Patient presents with    Heart Murmur     Patient is here for annual follow up states he is doing well denies any cardiac symptoms. BP machine once showed heart rate drop to 32.      Assessment and Plan   1.  Elevated coronary calcium  score / Coronary artery disease   Elevated coronary calcium  score at 654 (02/19/2020)   Asymptomatic   ECG: NSR , WNL   Pt has significantly CV risk factors but has been asymptomatic   Pt has stopped ASA due to fear of potential GI side effects (asymptomatic in the past when on the med).   - restart ASA  81 mg po qd  - continue Atorvastatin  and Losartan    - Lexiscan  Nuclear Stress Test to rule out progression of CAD ( pt cannot walk on the treadmill).     2. Essential hypertension  Elevated today but pt reports BP within normal limits when checked at home, but elevated when in doctor's office.   - continue amlodipine  and losartan  at current dosages, renew meds.     3. Hyperlipidemia  LDL 91 mg/dl  (3/87/75) , goal < 70 mg/dl   - Increase Atorvastatin  to  40 mg po qd.   - add CoQ10 200-300 mg po qd to prevent statin -related muscle injury.   - check lipid panel and LFT in 2 months.     Previous Cardiovascular Testing   Echo  02/19/2020: nl LV size , mildly increased LV wall thickness, LVEF 62%, mild AI, RVSP  25 mmHg. Nl RV size and RVSF.   Lexiscan  perfusion study  01/25/2020: LVEF 59% , no ischemia .     Coronary calcium  score   12/20/2019:   Left Main:  117  Left Anterior Descending:  409  Left Circumflex:           83  Right Coronary:            45  Posterior Descending:      0  TOTAL CAC SCORE:       654    History of Present Illness   77 y.o.male with a PMH of diverticulitis, elevated coronary artery calcium  score  01/2020, HTN, HLP,hyperacusis, Melanoma s/p surgical resection, who presents for follow-up.     Pt was previously seen in 2023.  Today , he states that he has been felling well since the previous visit. He denies any CP, SOB,  palpitations, dizziness, LH or leg swelling. He exercises a few times a week for > 30 min each time and denies any DOE. There was one time that his pulse rate may be decreased to 30s bpm but asymptomatic.  His BP is elevated today in clinic but he reports white coat syndrome. His BP was normal when checked at home at 120s/60s-70s mmHg. He has stopped taking Aspirin  for fear of potential GI side effects given advanced age but denies ever having any side effects in the past.     Past Medical History     Past Medical History:   Diagnosis Date    Diverticulitis     Elevated coronary artery calcium  score 01/2020    H/O ABNORMAL CORONARY CALCIUM  SCORE. 01/22/20 CARD NOTE FROM DR. BONTEMPO IN EPIC. 02/15/20 ECHO RESULTS PENDING IN EPIC.     Hearing loss     wears hearing aids, about 20% bilaterally     Heart murmur 01/22/2020    basal  systolic murmur - ASYMPTOMATIC.    Hyperacusis of both ears     participating in audiotherapy     Hyperlipidemia     MANAGED WITH MEDS.    Hypertension      MANAGED WITH MEDS. H/O WHITE COAT SYNDROME.     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 Jerilynn Seip (Derm)    Tinnitus 1993     Past Surgical History     Past Surgical History:   Procedure Laterality Date    APPENDECTOMY (OPEN)  1973    COLONOSCOPY, DIAGNOSTIC (SCREENING)  02/2011    COLONOSCOPY, DIAGNOSTIC (SCREENING) N/A 08/24/2016    Procedure: COLONOSCOPY;  Surgeon: Glenis Coward, MD;  Location: [id number];  Service: Gastroenterology;  Laterality: N/A;  COLONOSCOPY    EXCISION, CYST  12/2006    back    KNEE ARTHROSCOPY Left 09/2011    LAMINECTOMY, POSTERIOR LUMBAR, DECOMPRESSION, LEVEL 2 Bilateral 03/06/2020    Procedure: BILATERAL L2-L4 LAMINECTOMY;  Surgeon: Buel Jacquet, MD;  Location: [facility];  Service: Neurosurgery;  Laterality: Bilateral;  BILATERAL L2-L4 LAMINECTOMIES    RELEASE, 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   Vaping Use    Vaping status: Never Used   Substance Use Topics    Alcohol use: Yes     Alcohol/week: 2.0 standard drinks of alcohol     Types: 2 Glasses of wine per week     Comment: 2 glasses per day    Drug use: Yes     Types: Marijuana     Comment: Typically uses twice daily     Family History     Family History   Problem Relation Name Age of Onset    Alzheimer's disease Mother      Heart attack Father Ralphie Lovelady     Coronary artery disease Father Alm JUDITHANN Mace         died at 32    Heart failure Father Alm JUDITHANN Mace     Valve Surgery Father Alm JUDITHANN Mace         at age 71 quad bypass    Stroke Paternal Grandfather Jaimin Krupka         died age 55; had stroke in his 7s     Allergies     Allergies   Allergen Reactions    Pollen Extract        Medications     Current Outpatient Medications   Medication Sig Dispense Refill    amLODIPine  (NORVASC ) 5 MG tablet TAKE 1 TABLET DAILY 90 tablet 1    Cholecalciferol (Vitamin D3) 50 MCG (2000 UT) Tab Take by mouth daily      eszopiclone  (LUNESTA ) 2 MG tablet TAKE 1 TABLET NIGHTLY 90 tablet 1    ketoconazole  (NIZORAL ) 2 % cream Apply topically daily 60 g 2    ketoconazole  (NIZORAL ) 2 % shampoo Apply topically once a  week 120 mL 2    losartan  (COZAAR ) 100 MG tablet Take 1 tablet (100 mg) by mouth daily 90 tablet 3    aspirin  EC 81 MG EC tablet Take 1 tablet (81 mg) by mouth once daily 90 tablet 3    atorvastatin  (LIPITOR) 40 MG tablet TAKE 1 TABLET NIGHTLY 90 tablet 3    Coenzyme Q10 (CoQ10) 200 MG Cap Take 1 capsule (200 mg) by mouth once daily       No current facility-administered medications for this visit.     Review of Systems     General:  Not Present- Fatigue, Weight Gain and Weight Loss.  Respiratory: Not Present- Bloody sputum, Cough, and Wheezing.  Cardiovascular: Not Present- Calf, thigh or buttock pain with walking, Chest Pain, Difficulty Breathing Lying Down, Dyspnea, Edema, Awakening Short of Breath, and Palpitations.  Gastrointestinal: Not Present- Bloody Stool, Constipation, Diarrhea, Hematemesis, Indigestion, Nausea and Vomiting.  Neurological: Not Present- Dizziness, Syncope and Weakness.    Physical Exam     Vitals:    12/29/23 1410   BP: 187/86   BP Site: Right arm   Patient Position: Sitting   Cuff Size: Medium   Pulse: 67   SpO2: 98%   Weight: 88.9 kg (196 lb)   Height: 1.854 m (6' 1)     Body mass index is 25.86 kg/m.    General: awake, alert, oriented x 3, no acute distress.  Neck: no JVD, normal carotid upstoke without bruits  Cardiovascular: regular rate and rhythm, nl S1 and S2, no murmur,  rubs or gallops     Lungs: CTA bilaterally,  no wheezing or rales.   Abdomen: soft, non-tender, non-distended, no hepatosplenomegaly, normoactive bowel sounds  Extremities: no clubbing, cyanosis, or edema , 2+ DP b/l  Neuro: no focal deficit, sensation intact. CNS 2-12 intact     Labs   12/17/2021: TSH  2.48, LFT normal except T. Bili 2.2   01/05/23: LFT normal except T. Bili 2.2 , TSH  1.98  12/15/2020: HgA1C 5.3%   Lipid Panel    Latest Reference Range & Units 12/15/20 11:27 12/17/21 09:09 01/05/23 11:12   Cholesterol <=199 mg/dL [phone]   HDL >=59 mg/dL 51 56 48   LDL Calculated 0 - 129 mg/dL 81 83 91   Triglycerides 34 - 149 mg/dL 885 75 883   Cholesterol / HDL Ratio Index 3.0 2.8 3.4   VLDL Calculated 10 - 40 mg/dL 23 15 23      CBC   Lab Results   Component Value Date    WBC 6.26 01/05/2023    HGB 15.5 01/05/2023    HCT 47.3 01/05/2023    MCV 94.6 01/05/2023    PLT 270 01/05/2023      BMP:   Lab Results   Component Value Date    Sodium 144 01/05/2023    Sodium 141 12/17/2021    Potassium 4.4 01/05/2023    Potassium 4.1 12/17/2021     Chloride 107 01/05/2023    Chloride 107 12/17/2021    Chloride 104 08/09/2013    CO2 29 01/05/2023    CO2 25 12/17/2021    BUN 15 01/05/2023    BUN 12.0 12/17/2021    Creatinine 0.8 01/05/2023    Creatinine 0.8 12/17/2021    Creatinine 0.89 08/09/2013    Glucose 114 (H) 01/05/2023    Glucose 108 (H) 12/17/2021    Calcium  10.2 01/05/2023    Calcium  8.8 12/17/2021     INR No  results found for: INR, PROTIME     EKG   ECG 04/27/2022: NSR, WNL.   ECG 12/29/23: SR at 66 bpm, with PVCs.     Follow-Up     Return in about 3 months (around 03/30/2024).    Warmest Regards,    Trannie Bardales, MD PhD  [facility]

## 2023-12-29 NOTE — Patient Instructions (Addendum)
 Lexiscan  nuclear stress test   Increase atorvastatin  to 40 mg once a day , decrease back down to 20 mg once a day if developing any side effects such as muscle pain or muscle weakness , and let Dr. Akemi Overholser know   Add CoQ10 200 -300 mg once day while on statin   Check lipid panel,  liver function and HgA1C in 2 months after fasting x 12 hrs   Re-start Aspirin  81 mg once a day

## 2024-01-06 LAB — ECG 12-LEAD
Atrial Rate: 66 {beats}/min
P Axis: 53 degrees
P-R Interval: 180 ms
Q-T Interval: 418 ms
QRS Duration: 86 ms
QTC Calculation (Bezet): 438 ms
R Axis: -4 degrees
T Axis: 62 degrees
Ventricular Rate: 66 {beats}/min

## 2024-01-09 ENCOUNTER — Other Ambulatory Visit: Payer: Self-pay | Admitting: Internal Medicine

## 2024-01-09 DIAGNOSIS — I159 Secondary hypertension, unspecified: Secondary | ICD-10-CM

## 2024-01-09 DIAGNOSIS — G479 Sleep disorder, unspecified: Secondary | ICD-10-CM

## 2024-01-16 ENCOUNTER — Inpatient Hospital Stay
Admission: RE | Admit: 2024-01-16 | Discharge: 2024-01-16 | Disposition: A | Discharge status: Home / Self Care | Source: Ambulatory Visit | Attending: Cardiovascular Disease | Admitting: Cardiovascular Disease

## 2024-01-16 DIAGNOSIS — I2510 Atherosclerotic heart disease of native coronary artery without angina pectoris: Secondary | ICD-10-CM | POA: Insufficient documentation

## 2024-01-16 MED ORDER — REGADENOSON 0.4 MG/5ML IV SOLN
0.4000 mg | Freq: Once | INTRAVENOUS | Status: AC | PRN
Start: 2024-01-16 — End: 2024-01-16
  Administered 2024-01-16: 0.4 mg via INTRAVENOUS
  Filled 2024-01-16: qty 5

## 2024-01-16 MED ORDER — TECHNETIUM TC 99M TETROFOSMIN IV KIT
10.0000 | PACK | Freq: Once | INTRAVENOUS | Status: AC | PRN
Start: 2024-01-16 — End: 2024-01-16
  Administered 2024-01-16: 10 via INTRAVENOUS

## 2024-01-16 MED ORDER — TECHNETIUM TC 99M TETROFOSMIN IV KIT
35.0000 | PACK | Freq: Once | INTRAVENOUS | Status: AC | PRN
Start: 2024-01-16 — End: 2024-01-16
  Administered 2024-01-16: 35 via INTRAVENOUS

## 2024-01-16 MED ORDER — REGADENOSON 0.4 MG/5ML IV SOLN
INTRAVENOUS | Status: AC
Start: 2024-01-16 — End: 2024-01-16
  Filled 2024-01-16: qty 5

## 2024-01-17 LAB — NM MYOCARDIAL PERFUSION SPECT W STRESS AND REST: Stress LV Ejection Fraction: 51

## 2024-01-19 ENCOUNTER — Ambulatory Visit (INDEPENDENT_AMBULATORY_CARE_PROVIDER_SITE_OTHER): Payer: Self-pay | Admitting: Cardiovascular Disease

## 2024-01-19 DIAGNOSIS — I2584 Coronary atherosclerosis due to calcified coronary lesion: Secondary | ICD-10-CM

## 2024-01-20 NOTE — Telephone Encounter (Signed)
Left voicemail with instructions to call back

## 2024-01-23 DIAGNOSIS — I10 Essential (primary) hypertension: Secondary | ICD-10-CM | POA: Diagnosis not present

## 2024-01-23 DIAGNOSIS — E039 Hypothyroidism, unspecified: Secondary | ICD-10-CM | POA: Diagnosis not present

## 2024-01-23 NOTE — Telephone Encounter (Signed)
 Called mobile number listed.  Left voicemail with instructions to call back.

## 2024-01-23 NOTE — Telephone Encounter (Signed)
 Called pt at home number.  Left message to call back.

## 2024-01-23 NOTE — Telephone Encounter (Signed)
 Late entry for 3:18 pm: Called home number. Spoke to pt's wife.   States pt is busy. Informed RN will call pt in 30 minutes.

## 2024-01-23 NOTE — Telephone Encounter (Signed)
 Pts wife called back, stating Are you the nurse that called earlier. He's available now to talk.    Chart reviewed, ask pts spouse if pt was available to talk; spouse/wife said, yea he's right here pt picked up phone, wife/spouse continued to remain on other line and talk/listen to conversation.    Writer d/w pt results and recommendations per Dr. Zhao.  All questions discussed/answered from both pt and spouse.  Provided both pt and spouse with CDS phone number to schedule Echocardiogram; pt/spouse will call our office or send MyChart message if any challenges in scheduling f/u OV with Dr. Zhao or NP/PA for several days after Echo is scheduled/so we may assist prn.    Both pt and spouse verbalized an understanding and agreeable with plan.

## 2024-01-25 ENCOUNTER — Ambulatory Visit (INDEPENDENT_AMBULATORY_CARE_PROVIDER_SITE_OTHER)

## 2024-01-25 ENCOUNTER — Ambulatory Visit
Admission: RE | Admit: 2024-01-25 | Discharge: 2024-01-25 | Disposition: A | Discharge status: Home / Self Care | Source: Ambulatory Visit | Attending: Cardiovascular Disease | Admitting: Cardiovascular Disease

## 2024-01-25 DIAGNOSIS — I2584 Coronary atherosclerosis due to calcified coronary lesion: Secondary | ICD-10-CM | POA: Insufficient documentation

## 2024-01-25 DIAGNOSIS — I2510 Atherosclerotic heart disease of native coronary artery without angina pectoris: Secondary | ICD-10-CM | POA: Insufficient documentation

## 2024-01-25 NOTE — Progress Notes (Signed)
 Prevnar 20 vaccination was administered IM in left deltoid. Detailed information and VIS was given to the patient. Patient left in good condition.     Instructed patient to apply a cold compress to injection site if soreness, redness, itching or swelling occurs and contact the office if these symptoms persists after 5 days.

## 2024-01-26 LAB — ECHO ADULT TTE COMPLETE
AV Area (Cont Eq VTI): 4.7192
AV Mean Gradient: 3
AV Peak Velocity: 1.07
Ao Root Diameter (2D): 3.6
BP Mod LV Ejection Fraction: 59
GLS Average: -21
IVS Diastolic Thickness (2D): 1.13
LA Dimension (2D): 4.6
LA Volume Index (BP A-L): 33.3757
LVID diastole (2D): 4.77
LVID systole (2D): 3.25
MV E/A: 0.6514
MV E/e' (Average): 10.6803
Prox Ascending Aorta Diameter: 4
Pulmonary Valve Findings: NORMAL
RV Basal Diastolic Dimension: 3.94
RV Systolic Pressure: 23.7936
TAPSE: 2.65
Tricuspid Valve Findings: NORMAL

## 2024-02-02 ENCOUNTER — Ambulatory Visit (INDEPENDENT_AMBULATORY_CARE_PROVIDER_SITE_OTHER): Payer: Self-pay | Admitting: Cardiovascular Disease

## 2024-02-15 DIAGNOSIS — M1990 Unspecified osteoarthritis, unspecified site: Secondary | ICD-10-CM | POA: Diagnosis not present

## 2024-02-15 DIAGNOSIS — H3552 Pigmentary retinal dystrophy: Secondary | ICD-10-CM | POA: Diagnosis not present

## 2024-02-15 DIAGNOSIS — Z6825 Body mass index (BMI) 25.0-25.9, adult: Secondary | ICD-10-CM | POA: Diagnosis not present

## 2024-02-15 DIAGNOSIS — E039 Hypothyroidism, unspecified: Secondary | ICD-10-CM | POA: Diagnosis not present

## 2024-02-15 DIAGNOSIS — I10 Essential (primary) hypertension: Secondary | ICD-10-CM | POA: Diagnosis not present

## 2024-02-15 DIAGNOSIS — F17211 Nicotine dependence, cigarettes, in remission: Secondary | ICD-10-CM | POA: Diagnosis not present

## 2024-02-15 DIAGNOSIS — E663 Overweight: Secondary | ICD-10-CM | POA: Diagnosis not present

## 2024-02-15 DIAGNOSIS — Z008 Encounter for other general examination: Secondary | ICD-10-CM | POA: Diagnosis not present

## 2024-02-15 DIAGNOSIS — Z8673 Personal history of transient ischemic attack (TIA), and cerebral infarction without residual deficits: Secondary | ICD-10-CM | POA: Diagnosis not present

## 2024-02-17 ENCOUNTER — Ambulatory Visit (INDEPENDENT_AMBULATORY_CARE_PROVIDER_SITE_OTHER): Admitting: Adult Health

## 2024-02-17 ENCOUNTER — Encounter (INDEPENDENT_AMBULATORY_CARE_PROVIDER_SITE_OTHER): Payer: Self-pay | Admitting: Adult Health

## 2024-02-17 VITALS — BP 197/124 | HR 69 | Resp 16 | Wt 196.8 lb

## 2024-02-17 DIAGNOSIS — I2584 Coronary atherosclerosis due to calcified coronary lesion: Secondary | ICD-10-CM

## 2024-02-17 DIAGNOSIS — E782 Mixed hyperlipidemia: Secondary | ICD-10-CM

## 2024-02-17 DIAGNOSIS — R931 Abnormal findings on diagnostic imaging of heart and coronary circulation: Secondary | ICD-10-CM

## 2024-02-17 DIAGNOSIS — I2510 Atherosclerotic heart disease of native coronary artery without angina pectoris: Secondary | ICD-10-CM

## 2024-02-17 DIAGNOSIS — I10 Essential (primary) hypertension: Secondary | ICD-10-CM

## 2024-02-17 NOTE — Patient Instructions (Addendum)
-   continue current medications    - have fasting labs this month or next     - BP goal <130/80- send me a BP reading once you are here    - LDL goal <70    - 150 minutes moderate aerobic exercise per week     - Mediterranean style diet     - 6 month follow-up with Dr. Zhao

## 2024-02-17 NOTE — Progress Notes (Signed)
 [facility] [place] OFFICE VISIT    I had the pleasure of seeing Frederick [name] today for cardiovascular follow up. He is a pleasant 78 y.o. male who presents to review recent cardiac testing.     Last OV 12/2023 with Dr. Zhao. Elevated CAC ~600. MPI ordered which did not show any areas of ischemia. EF noted to be 51%. 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     Today he said he is feeling well.     Continues to exercise at home w/ treadmill, total gym, light resistance training.     Patient denies CP, SOB, PND, orthopnea, palpitations, dizziness, syncope, or edema.      BP at home runs 120-130/70-80.     MEDICATIONS:     amLODIPine  (NORVASC ) 5 MG tablet, TAKE 1 TABLET DAILY    aspirin  EC 81 MG EC tablet, Take 1 tablet (81 mg) by mouth once daily    atorvastatin  (LIPITOR) 40 MG tablet, TAKE 1 TABLET NIGHTLY    Cholecalciferol (Vitamin D3) 50 MCG (2000 UT) Tab, Take by mouth daily    Coenzyme Q10 (CoQ10) 200 MG Cap, Take 1 capsule (200 mg) by mouth once daily    eszopiclone  (LUNESTA ) 2 MG tablet, TAKE 1 TABLET NIGHTLY    losartan  (COZAAR ) 100 MG tablet, TAKE 1 TABLET DAILY    ketoconazole  (NIZORAL ) 2 % cream, Apply topically daily (Patient not taking: Reported on 02/17/2024)    ketoconazole  (NIZORAL ) 2 % shampoo, Apply topically once a week (Patient not taking: Reported on 02/17/2024)    PHYSICAL EXAMINATION  Vital Signs: BP (!) 197/124 Comment: pt is anxious and does not like getting his BP checked at doctors' offices  Pulse 69   Resp 16   Wt 89.3 kg (196 lb 12.8 oz)   SpO2 98%   BMI 25.96 kg/m    Chest: Clear to auscultation bilaterally  Cardiovascular: No murmurs or gallops.   Abdomen: Soft, nontender. No pulsatile masses or bruits.    Extremities: Warm without edema. Peripheral pulses are full and equal.    LABS:   Lab Results   Component Value Date    WBC 6.26 01/05/2023    HGB 15.5 01/05/2023    HCT 47.3 01/05/2023    PLT 270 01/05/2023    NA 144  01/05/2023    K 4.4 01/05/2023    BUN 15 01/05/2023    CREAT 0.8 01/05/2023    GLU 114 (H) 01/05/2023    CHOL 162 01/05/2023    TRIG 116 01/05/2023    HDL 48 01/05/2023    LDL 91 01/05/2023    AST 26 01/05/2023    ALT 24 01/05/2023    HGBA1C 5.3 12/15/2020    TSH 1.98 01/05/2023        IMPRESSION/RECOMMENDATIONS: Frederick [name] is a 78 y.o. male who presents for follow up.     - CAD, CAC 654, with no ischemia on MPI, normal TTE.     - HTN, elevated here but he has known [facility]- I have asked him to send me a reading once he is home.     - HLD, atorvastatin  in crease to 40 mg last month-needs updated labs.      - TAA, 4.0 cm     Plan   - continue current medications  - have fasting labs this month or next   - BP goal <130/80- send me a BP reading once you are here  - LDL goal <  70  - 150 minutes moderate aerobic exercise per week   - Mediterranean style diet   - f/u with Dr. Zhao 9/11 as scheduled

## 2024-02-21 ENCOUNTER — Other Ambulatory Visit: Payer: Self-pay

## 2024-02-21 ENCOUNTER — Encounter (INDEPENDENT_AMBULATORY_CARE_PROVIDER_SITE_OTHER): Payer: Self-pay | Admitting: Adult Health

## 2024-02-21 DIAGNOSIS — E559 Vitamin D deficiency, unspecified: Secondary | ICD-10-CM

## 2024-02-21 DIAGNOSIS — Z0000 Encounter for general adult medical examination without abnormal findings: Secondary | ICD-10-CM

## 2024-02-21 DIAGNOSIS — Z125 Encounter for screening for malignant neoplasm of prostate: Secondary | ICD-10-CM

## 2024-02-21 NOTE — Addendum Note (Signed)
 Addended by: VENETS, Kallin Henk on: 02/21/2024 04:43 PM     Modules accepted: Orders

## 2024-02-23 DIAGNOSIS — I10 Essential (primary) hypertension: Secondary | ICD-10-CM | POA: Diagnosis not present

## 2024-02-23 DIAGNOSIS — E039 Hypothyroidism, unspecified: Secondary | ICD-10-CM | POA: Diagnosis not present

## 2024-02-24 ENCOUNTER — Ambulatory Visit: Payer: Medicare Other

## 2024-02-27 ENCOUNTER — Encounter: Payer: Self-pay | Admitting: Internal Medicine

## 2024-02-28 ENCOUNTER — Ambulatory Visit (INDEPENDENT_AMBULATORY_CARE_PROVIDER_SITE_OTHER)

## 2024-02-28 ENCOUNTER — Ambulatory Visit (INDEPENDENT_AMBULATORY_CARE_PROVIDER_SITE_OTHER): Payer: Medicare Other | Admitting: Internal Medicine

## 2024-02-28 ENCOUNTER — Ambulatory Visit (FREE_STANDING_LABORATORY_FACILITY): Payer: Medicare Other

## 2024-02-28 ENCOUNTER — Encounter: Payer: Self-pay | Admitting: Internal Medicine

## 2024-02-28 VITALS — BP 186/82 | HR 68 | Temp 98.3°F | Resp 15 | Ht 74.0 in | Wt 194.3 lb

## 2024-02-28 DIAGNOSIS — Z0000 Encounter for general adult medical examination without abnormal findings: Secondary | ICD-10-CM

## 2024-02-28 DIAGNOSIS — I10 Essential (primary) hypertension: Secondary | ICD-10-CM

## 2024-02-28 DIAGNOSIS — E559 Vitamin D deficiency, unspecified: Secondary | ICD-10-CM

## 2024-02-28 DIAGNOSIS — Z125 Encounter for screening for malignant neoplasm of prostate: Secondary | ICD-10-CM

## 2024-02-28 DIAGNOSIS — N401 Enlarged prostate with lower urinary tract symptoms: Secondary | ICD-10-CM

## 2024-02-28 DIAGNOSIS — E782 Mixed hyperlipidemia: Secondary | ICD-10-CM

## 2024-02-28 DIAGNOSIS — Z79899 Other long term (current) drug therapy: Secondary | ICD-10-CM

## 2024-02-28 DIAGNOSIS — M4726 Other spondylosis with radiculopathy, lumbar region: Secondary | ICD-10-CM

## 2024-02-28 DIAGNOSIS — Z85820 Personal history of malignant melanoma of skin: Secondary | ICD-10-CM

## 2024-02-28 DIAGNOSIS — R351 Nocturia: Secondary | ICD-10-CM

## 2024-02-28 DIAGNOSIS — M720 Palmar fascial fibromatosis [Dupuytren]: Secondary | ICD-10-CM

## 2024-02-28 DIAGNOSIS — H9313 Tinnitus, bilateral: Secondary | ICD-10-CM

## 2024-02-28 DIAGNOSIS — G4700 Insomnia, unspecified: Secondary | ICD-10-CM

## 2024-02-28 LAB — LIPID PANEL
Cholesterol / HDL Ratio: 3.1 {index}
Cholesterol: 150 mg/dL (ref <=199)
HDL: 48 mg/dL (ref >=40)
LDL Calculated: 82 mg/dL (ref 0–99)
Triglycerides: 98 mg/dL (ref 34–149)
VLDL Calculated: 20 mg/dL (ref 10–40)

## 2024-02-28 LAB — COMPREHENSIVE METABOLIC PANEL
ALT: 31 U/L (ref <=55)
AST (SGOT): 36 U/L (ref <=41)
Albumin/Globulin Ratio: 1.3 (ref 0.9–2.2)
Albumin: 4.4 g/dL (ref 3.5–5.0)
Alkaline Phosphatase: 62 U/L (ref 37–117)
Anion Gap: 13 (ref 5.0–15.0)
BUN: 12 mg/dL (ref 9–28)
Bilirubin, Total: 2.2 mg/dL — ABNORMAL HIGH (ref 0.2–1.2)
CO2: 23 meq/L (ref 17–29)
Calcium: 9.5 mg/dL (ref 7.9–10.2)
Chloride: 108 meq/L (ref 99–111)
Creatinine: 0.9 mg/dL (ref 0.5–1.5)
GFR: >60 mL/min/1.73 m2 (ref >=60.0)
Globulin: 3.4 g/dL (ref 2.0–3.6)
Glucose: 108 mg/dL — ABNORMAL HIGH (ref 70–100)
Potassium: 4 meq/L (ref 3.5–5.3)
Protein, Total: 7.8 g/dL (ref 6.0–8.3)
Sodium: 144 meq/L (ref 135–145)

## 2024-02-28 LAB — LAB USE ONLY - CBC WITH DIFFERENTIAL
Absolute Basophils: 0.02 x10 3/uL (ref 0.00–0.08)
Absolute Eosinophils: 0.1 x10 3/uL (ref 0.00–0.44)
Absolute Immature Granulocytes: 0.02 x10 3/uL (ref 0.00–0.07)
Absolute Lymphocytes: 1.32 x10 3/uL (ref 0.42–3.22)
Absolute Monocytes: 0.45 x10 3/uL (ref 0.21–0.85)
Absolute Neutrophils: 3.93 x10 3/uL (ref 1.10–6.33)
Absolute nRBC: 0 x10 3/uL (ref <=0.00)
Basophils %: 0.3 %
Eosinophils %: 1.7 %
Hematocrit: 50 % — ABNORMAL HIGH (ref 37.6–49.6)
Hemoglobin: 16.6 g/dL (ref 12.5–17.1)
Immature Granulocytes %: 0.3 %
Lymphocytes %: 22.6 %
MCH: 30.9 pg (ref 25.1–33.5)
MCHC: 33.2 g/dL (ref 31.5–35.8)
MCV: 92.9 fL (ref 78.0–96.0)
MPV: 9.8 fL (ref 8.9–12.5)
Monocytes %: 7.7 %
Neutrophils %: 67.4 %
Platelet Count: 303 x10 3/uL (ref 142–346)
Preliminary Absolute Neutrophil Count: 3.93 x10 3/uL (ref 1.10–6.33)
RBC: 5.38 x10 6/uL (ref 4.20–5.90)
RDW: 13 % (ref 11–15)
WBC: 5.84 x10 3/uL (ref 3.10–9.50)
nRBC %: 0 /100{WBCs} (ref <=0.0)

## 2024-02-28 LAB — URINALYSIS WITH MICROSCOPIC EXAM
Urine Bilirubin: NEGATIVE
Urine Glucose: NEGATIVE
Urine Ketones: NEGATIVE mg/dL
Urine Leukocyte Esterase: NEGATIVE
Urine Nitrite: NEGATIVE
Urine Specific Gravity: 1.017 (ref 1.001–1.035)
Urine Urobilinogen: NORMAL mg/dL (ref 0.2–2.0)
Urine pH: 6 (ref 5.0–8.0)

## 2024-02-28 LAB — TSH: TSH: 2.08 u[IU]/mL (ref 0.35–4.94)

## 2024-02-28 LAB — BILIRUBIN, DIRECT: Bilirubin Direct: 0.7 mg/dL — ABNORMAL HIGH (ref 0.0–0.5)

## 2024-02-28 LAB — VITAMIN D, 25 OH, TOTAL: Vitamin D 25-OH, Total: 63 ng/mL (ref 30–100)

## 2024-02-28 LAB — HEMOGLOBIN A1C
Average Estimated Glucose: 114 mg/dL
Hemoglobin A1C: 5.6 % (ref 4.6–5.6)

## 2024-02-28 LAB — PSA TOTAL, ANNUAL SCREENING: Prostate Specific Antigen, Total: 1.7 ng/mL (ref 0.000–4.000)

## 2024-02-28 NOTE — Progress Notes (Signed)
 Testing:  Labs were drawn from left [facility] without difficulty. Aseptic technique utilized. No complications noted. Dressing applied. Patient tolerated well. Urine collected at this visit. Patient instructed on clean-catch technique per protocol. Patient verbalized understanding.    The below labs were sent to [facility] refrigerated:  one (1) Gold/SST Tube(s) - collection: centrifuged and serum transferred to plastic vial.  one (1) Lavender (EDTA) Tube(s) - collection: whole blood sent.  one (1) Urinalysis Tube.

## 2024-02-28 NOTE — Progress Notes (Unsigned)
 Fitness Evaluation:   Body Fat as percentage: In men, over 25% is obese, 20-25% is higher than normal, 16-20% is healthy / normal, <16% or under is considered lean / ideal.  2025 = 20.2%  2024 = 19.4%    Visceral Fat: Abdominal belly fat.Visceral fat is a type of body fat that exists in the abdomen and surrounds the internal organs. Everyone has some, especially those who are sedentary, chronically stressed, or maintain unhealthy diets. A different type of fat -- subcutaneous fat -- which builds up under the skin, has less of a negative impact on health and is easier to lose than visceral fat. A high level of visceral fat can increase your risk for serious health problems including cardiovascular disease, types 2 diabetes, and increased blood pressure.    2025 = 8 (normal <10).   2023 = 7    Current Exercise Program:  Aerobics: Walking/running on treadmill every other day.  Strength: Resistance training every other day.  Stretching: Every other day with aerobic and strength training.    Audiology test: Patient declined. Patient has hearing aids and severe tinnitus. Yearly appointments with ENT.    Vision test: Patient declined. Yearly appointments with optometrist and ophthalmologist.       Pulmonary Function Test: Test completed. MD to review results with patient.    EKG: Done in June 2025 by cardiology.

## 2024-02-28 NOTE — Progress Notes (Unsigned)
 [facility]         Subjective     History of Present Illness  Frederick [name] is his wife. 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 - Dr Jerilynn Seip   3.Insomnia - Lunesta  2 mg needs a refill  4.Improved Back pain - stiffness - xrays show L4-L5 Jumped off a Bannister at a young age - glucosamine, Dr Melany did discectomy and now he feels pain controlled with stretch and massage, he is able to golf again  5.HTN controlled on Losaratan 100 mg , White coat HTN at home BP are > 140 recommend to monitor and share BP log in 2 weeks  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 Veggie sausage  Lunch - dinner, sandwhiches  Snack  sugar snaps with hummus  Dinner - homecooked meal, [address] - salmon, shrimp filet 2 x a month   Soda - none  Juice - none  Coffee - de-cafe coffee  Water - seltzer  Drink a little less wine    EXERCISE: 4 x a week every other day  Treadmill 30- 45 minutes he has been less compliant   Total Gym 20 mins    SLEEP: 11 - 4 to urinate , 6-7 am gets up, he does have about 6 hours of sleep, every night with Lunesta  2 mg QHS with in 10-15 mins    STRESS: self care with attitude and exercise    CANCER:No family history of uterine, cervical, ovary, colon, prostate  PGM and PAunt - radical mastectomy    HEALTH MAINTENANCE:  Immunizations-   Covid 4 shots total - Moderna - muscle soreness  Tdap   Flu vaccine   ShingRx-  will f/u at pharmacy/wait list    COLON:4 years will repeat next year - 2018 and then repeat in 5 years   EDJ:unijb   SKIN:dermatologist - melanoma on the back, left scalp removed pre-cancerous   Opthalmologist - slow growing cataract, specialist who is watching  the retina  Dental : UPTD had some dental work in place           Patient Active Problem List    Diagnosis Date Noted    S/P lumbar laminectomy 03/17/2020    DDD (degenerative disc disease), lumbar 01/30/2020    Spinal stenosis of lumbar region without neurogenic claudication 01/30/2020    Coronary artery disease involving native coronary artery of native heart without angina pectoris 01/22/2020    Essential hypertension 01/22/2020    Lumbar spondylosis 04/03/2019    ERRONEOUS ENCOUNTER--DISREGARD 05/05/2017    Dupuytren's contracture of left hand 08/09/2013    Labile blood pressure 02/22/2013    Dupuytren's contracture of both hands 02/22/2013    Hyperacusis of both ears 02/22/2013    Hyperlipidemia 01/18/2013    Insomnia 01/18/2013    Tinnitus 01/18/2013     Past Medical History:   Diagnosis Date    Diverticulitis     Elevated coronary artery calcium  score 01/2020    H/O ABNORMAL CORONARY CALCIUM  SCORE. 01/22/20 CARD NOTE FROM DR. BONTEMPO IN EPIC.  02/15/20 ECHO RESULTS PENDING IN EPIC.     Hearing loss     wears hearing aids, about 20% bilaterally     Heart murmur 01/22/2020    basal systolic murmur - ASYMPTOMATIC.    Hyperacusis of both ears     participating in audiotherapy     Hyperlipidemia     MANAGED WITH MEDS.    Hypertension      MANAGED WITH MEDS. H/O WHITE COAT SYNDROME.     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 Jerilynn Seip (Derm)    Tinnitus 1993     Past Surgical History[1]  Immunization History   Administered Date(s) Administered    COVID-19 mRNA BIVALENT vaccine 12 years and above (Moderna) 50 mcg/0.5 mL 04/14/2021, 12/29/2021    COVID-19 mRNA MONOVALENT vaccine BOOSTER 18 years and above (Moderna) 50 mcg/0.25 mL 06/03/2020, 12/02/2020    COVID-19 mRNA MONOVALENT vaccine PRIMARY SERIES 12 years and above (Moderna) 100 mcg/0.5 mL 08/18/2019, 09/15/2019    COVID-19 mRNA seasonal vaccine 12 years and above (Moderna) 50 mcg/0.5 mL 04/23/2022, 12/23/2022, 04/28/2023, 11/23/2023    Influenza quadrivalent (AFLURIA/FLUZONE ), 6 months and older, multi-dose, 5 mL 05/05/2018    Influenza quadrivalent (FLUBOK) recombinant, 18 years and older, 0.5 mL preservative free 04/30/2019    Influenza quadrivalent high-dose (FLUZONE  HIGH-DOSE) 65 years and older, 0.7 mL, preservative free 05/06/2020, 05/06/2021, 05/11/2022    Influenza trivalent (AFLURIA/FLUZONE ), 6 months and older, multi-dose 07/10/2013    Influenza trivalent high-dose (FLUZONE  HIGH-DOSE), 65 years and older, preservative free 05/14/2015, 05/04/2016, 05/01/2017, 05/13/2023    Pneumococcal conjugate (PREVNAR 20) 20-valent, preservative free 01/25/2024    Pneumococcal conjugate (PREVNAR) 13-valent 04/18/2014    Pneumococcal polysaccharide (PNEUMOVAX 23), 23-valent 08/28/2015    Tdap (tetanus, diphtheria reduced, acellular pertussis) (ADACEL/BOOSTRIX), adsorbed 04/18/2014    Zoster (SHINGRIX) vaccine, recombinant 03/24/2017,  07/29/2017    Zoster (ZOSTAVAX) vaccine, live 09/03/2015     Health Maintenance   Topic Date Due    Colonoscopy  08/24/2021    Influenza Vaccine  02/24/2024    FALLS RISK ANNUAL  02/27/2025    DEPRESSION SCREENING  02/27/2025    Medicare Annual Wellness Visit  02/27/2025    HEPATITIS C SCREENING  Completed    Shingrix Vaccine 50+  Completed  COVID-19 Vaccine  Completed    Pneumonia Vaccine Age 45 Years and Older  Completed   :  Current Medications[2]  Allergies[3]  Social History[4]  Family History[5]    Review of Systems  CONST: No weight change, no fevers/chills or sweats, fatigue, muscle aches  EYES: No blurry vision, double vision, loss of peripheral vision  EARS: No hearing loss, tinnitus, pain or discharge.   NOSE: No congestion, runny nose or bloody nose  MOUTH: No sore throat, oral lesions, difficulty swallowing, no change in character of voice  NECK: No swollen glands, stiffness or pain  CV: No chest pain, palpitations, leg swelling  RESP: No shortness of breath, cough, wheeze, asthma  GI: No n/v, diarrhea, constipation, abd pain, heartburn, no change in caliber/color of stool  MALE GU: No dysuria, frequency, hematuria, nocturia, testicular changes, penile discharge, erectile dysfunction  MALE GU: no breast pain, lumps or changes, dysuria, frequency, hematuria, nocturia, change in menstrual pattern, no abnormal bleeding or discharge  MSK:  No joint or muscle pain, swelling or weakness  SKIN:   No rashes lumps, sores or concerning moles  ENDO:  No heat/cold intolerance, polyuria, polydipsia, polyphagia  HEME:  No bruising/bleeding, swollen glands  NEURO:  No headache, lightneadedness, dizziness, loss of consciousness, numbness/tingling, weakness  PSYCH:  No depressed mood, anhedonia, anxiety    Physical Exam:  BP 186/82 (BP Site: Left arm, Patient Position: Sitting, Cuff Size: Medium) Comment: White Coat Syndrome  Pulse 68   Temp 98.3 F (36.8 C) (Temporal)   Resp 15   Ht 1.88 m (6' 2)   Wt 88.1  kg (194 lb 4.8 oz)   SpO2 99%   BMI 24.95 kg/m    Wt Readings from Last 3 Encounters:   02/28/24 88.1 kg (194 lb 4.8 oz)   02/17/24 89.3 kg (196 lb 12.8 oz)   12/29/23 88.9 kg (196 lb)     GEN: well developed well nourished male alert and appropriate in no apparent distress  HENT: tympanic membrane normal bilaterally, no nasal congestion, oropharynx normal w no exudate or erythema  EYES: PERRL, EOMI, no pallor or scleral icterus, normal conjunctiva  NECK: supple, no thyromegaly or nodules appreciated  LYMPH: no cervical or supraclavicular lymphadenopathy appreciated  CV: RRR, no m/r/g.  No LE edema.  2+ radial pulses present and equal.  RESP: CTAB, normal effort, no rhonchi or rales  GI: soft, nontender/ nondistended, NABS, no rebound or guarding, no evident hepatosplenomegaly  GU: normal external genitalia no masses or hernia evident or palpable  SKIN: warm, dry.  no visible rashes or concerning moles  MSK: Normal bulk and tone, normal strength 5/5 and sensation UE / LE  NEURO: PERRLA, EOMI, face symmetric, hearing intact to voice, palate raise, shoulder shrug and neck flexion intact, tongue midline. Moves all extremities.  PSYCH: normal mood and affect    No visits with results within 1 Week(s) from this visit.   Latest known visit with results is:   Hospital Outpatient Visit on 01/25/2024   Component Date Value Ref Range Status    GLS Average 01/25/2024 -21   Final    RV Basal Diastolic Dimension 01/25/2024 6.05   Final    RV Systolic Pressure 01/25/2024 23.7936   Final    Prox Ascending Aorta Diameter 01/25/2024 4   Final    TAPSE 01/25/2024 2.65   Final    LVID diastole (2D) 01/25/2024 4.77   Final    AV Peak Velocity 01/25/2024 1.07   Final  LVID systole (2D) 01/25/2024 3.25   Final    AV Area (Cont Eq VTI) 01/25/2024 [id number]   Final    IVS Diastolic Thickness (2D) 01/25/2024 1.13   Final    MV E/A 01/25/2024 [id number]   Final    BP Mod LV Ejection Fraction 01/25/2024 59   Final    AV  Mean Gradient 01/25/2024 3   Final    LA Dimension (2D) 01/25/2024 4.6   Final    Ao Root Diameter (2D) 01/25/2024 3.6   Final    LA Volume Index (BP A-L) 01/25/2024 [id number]   Final    MV E/e' (Average) 01/25/2024 [id number]   Final    Aortic Valve Findings 01/25/2024 There is no aortic stenosis.   Final    Aortic Valve Findings 01/25/2024 The aortic valve is tricuspid. The non coronary cusp is mildly calcified.   Final    Aortic Valve Findings 01/25/2024 There is trace aortic regurgitation.   Final    Pulmonary Valve Findings 01/25/2024 The pulmonic valve is structurally normal.   Final    Pulmonary Valve Findings 01/25/2024 There is no pulmonic regurgitation.   Final    Mitral Valve Findings 01/25/2024 The mitral valve is mildly thickened.   Final    Mitral Valve Findings 01/25/2024 There is trace mitral regurgitation.   Final    Tricuspid Valve Findings 01/25/2024 The tricuspid valve is structurally normal.   Final    Tricuspid Valve Findings 01/25/2024 There is trace tricuspid regurgitation.   Final    Tricuspid Valve Findings 01/25/2024 Insufficient tricuspid regurgitation jet to estimate pulmonary artery systolic pressure.   Final    Summary 01/25/2024    Final                    Value:ECHO ADULT TTE COMPLETE W STRAIN 3D Date: 01/25/2024 Normal atria. Compared to the prior study dated 02/15/2020, strain information is new and the ascending aorta measures 4.0cm on today's study (previously 3.7cm). Left ventricular systolic function   is normal with an ejection fraction by Biplane Method of Discs of  59 %. There is normal left ventricular diastolic function. The global longitudinal strain is -21.0 %, which is normal. Normal right ventricular systolic function. There is trace aortic   regurgitation. Insufficient tricuspid regurgitation jet to estimate pulmonary artery systolic pressure. 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          Fitness Evaluation: Recommend [facility]  Audiology  test:  reviewed results with patient ***  Vision test:  reviewed results with patient   Treadmill Stress Test: reviewed results with patient ***  InBody: Reviewed results of InBody with patient - notable for Wt *** SKMM *** BFM ***BMI ***1 PBF *** VF ***      Assessment/Plan:    No diagnosis found.   No problem-specific Assessment & Plan notes found for this encounter.       No orders of the defined types were placed in this encounter.                     Results          Assessment/Plan     Assessment & Plan  Risks & benefits of the new medication(s) were explained to the patient, who appeared to understand and agrees to the treatment plan.    No follow-ups on file.    Pinkey Shove, MD  12:51 PM 02/28/2024    [facility]  [address]  [place], [place] [address]  P) [phone]  F) [phone]  [email or web address]    Verbal consent obtained to record this visit.          [1]   Past Surgical History:  Procedure Laterality Date    APPENDECTOMY (OPEN)  1973    COLONOSCOPY, DIAGNOSTIC (SCREENING)  02/2011    COLONOSCOPY, DIAGNOSTIC (SCREENING) N/A 08/24/2016    Procedure: COLONOSCOPY;  Surgeon: Glenis Coward, MD;  Location: [id number];  Service: Gastroenterology;  Laterality: N/A;  COLONOSCOPY    EXCISION, CYST  12/2006    back    KNEE ARTHROSCOPY Left 09/2011    LAMINECTOMY, POSTERIOR LUMBAR, DECOMPRESSION, LEVEL 2 Bilateral 03/06/2020    Procedure: BILATERAL L2-L4 LAMINECTOMY;  Surgeon: Buel Jacquet, MD;  Location: [facility];  Service: Neurosurgery;  Laterality: Bilateral;  BILATERAL L2-L4 LAMINECTOMIES    RELEASE, 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    RELEASE, 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    ROTATOR CUFF REPAIR Left 09/2000    and shaving of bone spur   [2]   Current Outpatient Medications   Medication Sig Dispense Refill    amLODIPine  (NORVASC ) 5 MG tablet TAKE 1 TABLET DAILY 90 tablet 0    aspirin  EC 81 MG EC tablet Take 1 tablet (81 mg) by mouth once daily 90 tablet 3     atorvastatin  (LIPITOR) 40 MG tablet TAKE 1 TABLET NIGHTLY 90 tablet 3    Cholecalciferol (Vitamin D3) 50 MCG (2000 UT) Tab Take by mouth daily      Coenzyme Q10 (CoQ10) 200 MG Cap Take 1 capsule (200 mg) by mouth once daily      eszopiclone  (LUNESTA ) 2 MG tablet TAKE 1 TABLET NIGHTLY 90 tablet 0    losartan  (COZAAR ) 100 MG tablet TAKE 1 TABLET DAILY 90 tablet 0    ketoconazole  (NIZORAL ) 2 % cream Apply topically daily (Patient not taking: Reported on 02/28/2024) 60 g 2    ketoconazole  (NIZORAL ) 2 % shampoo Apply topically once a week (Patient not taking: Reported on 02/28/2024) 120 mL 2     No current facility-administered medications for this visit.   [3]   Allergies  Allergen Reactions    Pollen Extract    [4]   Social History  Socioeconomic History    Marital status: Married   Tobacco Use    Smoking status: Never     Passive exposure: Never    Smokeless tobacco: Never    Tobacco comments:     briefly tried pipes, cigars & cigarettes 1964-65; nothing since.   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     Social Drivers of Health     Financial Resource Strain: Low Risk  (02/27/2024)    Overall Financial Resource Strain (CARDIA)     Difficulty of Paying Living Expenses: Not hard at all   Food Insecurity: No Food Insecurity (02/27/2024)    Hunger Vital Sign     Worried About Running Out of Food in the Last Year: Never true     Ran Out of Food in the Last Year: Never  true   Transportation Needs: No Transportation Needs (02/27/2024)    PRAPARE - Therapist, art (Medical): No     Lack of Transportation (Non-Medical): No   Physical Activity: Sufficiently Active (02/27/2024)    Exercise Vital Sign     Days of Exercise per Week: 3 days      Minutes of Exercise per Session: 90 min   Stress: No Stress Concern Present (02/27/2024)    [employer] of Occupational Health - Occupational Stress Questionnaire     Feeling of Stress : Only a little   Social Connections: Unknown (05/09/2023)    Social Connection and Isolation Panel     Frequency of Communication with Friends and Family: Patient declined     Frequency of Social Gatherings with Friends and Family: Once a week     Attends Religious Services: Never     Database administrator or Organizations: No     Attends Banker Meetings: Never     Marital Status: Married   Catering manager Violence: Not At Risk (02/27/2024)    Humiliation, Afraid, Rape, and Kick questionnaire     Fear of Current or Ex-Partner: No     Emotionally Abused: No     Physically Abused: No     Sexually Abused: No   Housing Stability: Not At Risk (02/27/2024)    Housing Stability NCSS     Do you have housing?: Yes     Are you worried about losing your housing?: No   [5]   Family History  Problem Relation Name Age of Onset    Alzheimer's disease Mother      Heart attack Father Kayde Warehime     Coronary artery disease Father Opie Maclaughlin         died at 56    Heart failure Father Alm JUDITHANN Mace     Valve Surgery Father Alm JUDITHANN Mace         at age 84 quad bypass    Stroke Paternal Grandfather Cashton Hosley         died age 29; had stroke in his 4s

## 2024-03-01 ENCOUNTER — Encounter: Payer: Self-pay | Admitting: Internal Medicine

## 2024-03-06 ENCOUNTER — Ambulatory Visit (HOSPITAL_COMMUNITY)
Admission: RE | Admit: 2024-03-06 | Discharge: 2024-03-06 | Disposition: A | Discharge status: Home / Self Care | Source: Ambulatory Visit | Attending: Vascular Surgery | Admitting: Vascular Surgery

## 2024-03-06 ENCOUNTER — Other Ambulatory Visit (HOSPITAL_COMMUNITY): Payer: Self-pay | Admitting: Physician Assistant

## 2024-03-06 DIAGNOSIS — M7989 Other specified soft tissue disorders: Secondary | ICD-10-CM | POA: Diagnosis not present

## 2024-03-06 DIAGNOSIS — M79604 Pain in right leg: Secondary | ICD-10-CM | POA: Insufficient documentation

## 2024-03-06 DIAGNOSIS — R600 Localized edema: Secondary | ICD-10-CM | POA: Diagnosis not present

## 2024-03-25 DIAGNOSIS — I10 Essential (primary) hypertension: Secondary | ICD-10-CM | POA: Diagnosis not present

## 2024-03-25 DIAGNOSIS — E039 Hypothyroidism, unspecified: Secondary | ICD-10-CM | POA: Diagnosis not present

## 2024-03-28 DIAGNOSIS — E039 Hypothyroidism, unspecified: Secondary | ICD-10-CM | POA: Diagnosis not present

## 2024-03-28 DIAGNOSIS — R609 Edema, unspecified: Secondary | ICD-10-CM | POA: Diagnosis not present

## 2024-03-28 DIAGNOSIS — I10 Essential (primary) hypertension: Secondary | ICD-10-CM | POA: Diagnosis not present

## 2024-04-01 ENCOUNTER — Other Ambulatory Visit: Payer: Self-pay | Admitting: Internal Medicine

## 2024-04-01 DIAGNOSIS — I159 Secondary hypertension, unspecified: Secondary | ICD-10-CM

## 2024-04-01 DIAGNOSIS — G479 Sleep disorder, unspecified: Secondary | ICD-10-CM

## 2024-04-02 NOTE — Progress Notes (Signed)
 Dear Frederick [name]:      I wanted to review with you the results of your recent lab tests:    - PSA 1.700  - TSH normal 0- 4  - Electrolytes and kidney function are normal  - Liver functions are normal  - Fasting blood sugar mildly elevated 108 with no evidence of diabetes  - Blood count is normal with no evidence of infection or anemia  - Urinalysis is normal  - Vit D normal > 30     - Total cholesterol is 150 (goal < 200) triglycerides are 98 (goal < 150), HDL or good cholesterol is 48 (goal >40 ) LDL or bad cholesterol is 82 (numeric goal depends on overall cardiac risk estimation)    Please let me know if you have any concerns or questions.    Best regards,    Pinkey Shove, MD  10:14 PM 04/02/2024    [facility]  [address]  [place], [place] [address]  P) [phone]  F) [phone]  [email or web address]

## 2024-04-05 ENCOUNTER — Encounter (INDEPENDENT_AMBULATORY_CARE_PROVIDER_SITE_OTHER): Payer: Self-pay | Admitting: Cardiovascular Disease

## 2024-04-05 ENCOUNTER — Ambulatory Visit (INDEPENDENT_AMBULATORY_CARE_PROVIDER_SITE_OTHER): Admitting: Cardiovascular Disease

## 2024-04-05 VITALS — BP 154/90 | HR 75 | Ht 74.0 in | Wt 193.0 lb

## 2024-04-05 DIAGNOSIS — I2510 Atherosclerotic heart disease of native coronary artery without angina pectoris: Secondary | ICD-10-CM

## 2024-04-05 DIAGNOSIS — I10 Essential (primary) hypertension: Secondary | ICD-10-CM

## 2024-04-05 DIAGNOSIS — R9439 Abnormal result of other cardiovascular function study: Secondary | ICD-10-CM

## 2024-04-05 DIAGNOSIS — I2584 Coronary atherosclerosis due to calcified coronary lesion: Secondary | ICD-10-CM

## 2024-04-05 DIAGNOSIS — E782 Mixed hyperlipidemia: Secondary | ICD-10-CM

## 2024-04-05 MED ORDER — ATORVASTATIN CALCIUM 80 MG PO TABS
ORAL_TABLET | ORAL | 3 refills | Status: AC
Start: 2024-04-05 — End: ?

## 2024-04-05 NOTE — Patient Instructions (Signed)
 Increase atorvastatin  to 80 mg once a day , decrease to 40 mg once a day if developing any side effects such as muscle pain, muscle weakness or joint pain and let Dr. Dawan Farney Know . Otherwise, check lipid panel and liver function in 2 months after fasting x 12 hrs  Coronary CTA. Check BMP prior to the test .

## 2024-04-05 NOTE — Progress Notes (Signed)
 [facility]     Chief Complaint   Patient presents with    Coronary Artery Disease     Patient is here for follow up had nuc stress done 01/16/24, echo done 01/25/24. Patient states he is doing well denies any cardiac symptoms      Assessment and Plan   1.  Elevated coronary calcium  score / Coronary artery disease   Elevated coronary calcium  score at 654 (02/19/2020)   Asymptomatic   ECG: NSR , WNL   Pt has significantly CV risk factors but has been asymptomatic   Nuclear stress test  01/17/24: abnormal but no sig ischemia, diaphragmatic attenuation artifact present   - Coronary CTA to rule out obstructive CAD and reassess calcium  score   -check BMP prior to the test   - continue ASA  and Losartan    - Increase Atorvastatin  to 80 mg po qd.     2. Essential hypertension  Elevated today but pt reports BP within normal limits when checked at home. Reading comparison was done in the past   - continue amlodipine  and losartan  at current dosages    3. Hyperlipidemia  LDL 91 mg/dl  (3/87/75) , 82 (07/29/72) goal < 70 mg/dl   - Increase Atorvastatin  to  80 mg po qd.   - continue CoQ10 200 mg po qd.   - check lipid panel and LFT in 2 months.     Previous Cardiovascular Testing   Echo 01/25/24: nl LV size , mildly increased LV wall thickness, LVEF 59%,  LV GLS -21%, no RWMA, nl LVDF,  nl RV size and RVSF, nl LA and RA size, no sig valve dz, RVSP could not be assessed,aortic root 3.6 cm , AA 4.0 cm. RAP 3 mmHg.     Exercise nuclear stress test  01/17/24:   1. Abnormal stress and rest myocardial perfusion study with evidence of  infarct with minimal peri-infarct ischemia the territory of the RCA.    2. Subdiaphragmatic attenuation artifact limits interpretation of the  inferior wall.    3. Gated wall motion study demonstrates low normal left ventricular function  with calculated ejection fraction 51 %.    4. There appears to be mild hypokinesis in the mid to basal inferior wall  and basal inferoseptal wall.    5. 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    Echo  02/19/2020: nl LV size , mildly increased LV wall thickness, LVEF 62%, mild AI, RVSP  25 mmHg. Nl RV size and RVSF.   Lexiscan  perfusion study  01/25/2020: LVEF 59% , no ischemia .     Coronary calcium  score   12/20/2019:   Left Main:  117  Left Anterior Descending:  409  Left Circumflex:           83  Right Coronary:            45  Posterior Descending:      0  TOTAL CAC SCORE:       654    History of Present Illness   77 y.o.male with a PMH of diverticulitis, elevated coronary artery calcium  score  01/2020, HTN, HLP,hyperacusis, Melanoma s/p surgical resection, who presents for follow-up.     Pt states that he has been feeling well since the  previous visit. No CP, SOB, palpitations, dizziness, LH or legs welling. He has been physically active and denies DOE. His BP is elevated today in clinic but he reports normal BP when checked at home.     Past Medical History     Past Medical History:   Diagnosis Date    Diverticulitis     Elevated coronary artery calcium  score 01/2020    H/O ABNORMAL CORONARY CALCIUM  SCORE. 01/22/20 CARD NOTE FROM DR. BONTEMPO IN EPIC. 02/15/20 ECHO RESULTS PENDING IN EPIC.     Hearing loss     wears hearing aids, about 20% bilaterally     Heart murmur 01/22/2020    basal systolic murmur - ASYMPTOMATIC.    Hyperacusis of both ears     participating in audiotherapy     Hyperlipidemia     MANAGED WITH MEDS.    Hypertension      MANAGED WITH MEDS. H/O WHITE COAT SYNDROME.     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 Jerilynn Seip (Derm)    Tinnitus 1993     Past Surgical History     Past Surgical History:   Procedure Laterality Date    APPENDECTOMY (OPEN)  1973     COLONOSCOPY, DIAGNOSTIC (SCREENING)  02/2011    COLONOSCOPY, DIAGNOSTIC (SCREENING) N/A 08/24/2016    Procedure: COLONOSCOPY;  Surgeon: Glenis Coward, MD;  Location: [id number];  Service: Gastroenterology;  Laterality: N/A;  COLONOSCOPY    EXCISION, CYST  12/2006    back    KNEE ARTHROSCOPY Left 09/2011    LAMINECTOMY, POSTERIOR LUMBAR, DECOMPRESSION, LEVEL 2 Bilateral 03/06/2020    Procedure: BILATERAL L2-L4 LAMINECTOMY;  Surgeon: Buel Jacquet, MD;  Location: [facility];  Service: Neurosurgery;  Laterality: Bilateral;  BILATERAL L2-L4 LAMINECTOMIES    RELEASE, 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   Vaping Use    Vaping status: Never Used   Substance Use Topics    Alcohol use: Yes     Alcohol/week: 4.0 - 14.0 standard drinks of alcohol     Types: 4 - 14 Standard drinks or equivalent per week     Comment: 2 glasses per day    Drug use: Yes     Types: Marijuana     Comment: Typically uses twice daily     Family History     Family History   Problem Relation Name Age of Onset    Alzheimer's disease Mother      Heart attack Father Taren Toops     Coronary artery disease Father Alm JUDITHANN Mace         died at 27    Heart failure Father Alm JUDITHANN Mace     Valve Surgery Father Alm JUDITHANN Mace         at age 36 quad bypass    Stroke Paternal Grandfather Loi Rennaker  died age 7; had stroke in his 39s     Allergies     Allergies   Allergen Reactions    Pollen Extract        Medications     Current Outpatient Medications   Medication Sig Dispense Refill    amLODIPine  (NORVASC ) 5 MG tablet TAKE 1 TABLET DAILY 90 tablet 0    aspirin  EC 81 MG EC tablet Take 1  tablet (81 mg) by mouth once daily 90 tablet 3    Cholecalciferol (Vitamin D3) 50 MCG (2000 UT) Tab Take by mouth daily      Coenzyme Q10 (CoQ10) 200 MG Cap Take 1 capsule (200 mg) by mouth once daily      eszopiclone  (LUNESTA ) 2 MG tablet TAKE 1 TABLET NIGHTLY 90 tablet 0    losartan  (COZAAR ) 100 MG tablet TAKE 1 TABLET DAILY 90 tablet 0    atorvastatin  (LIPITOR) 80 MG tablet TAKE 1 TABLET NIGHTLY 90 tablet 3     No current facility-administered medications for this visit.     Review of Systems     General: Not Present- Fatigue, Weight Gain and Weight Loss.  Respiratory: Not Present- Bloody sputum, Cough, and Wheezing.  Cardiovascular: Not Present- Calf, thigh or buttock pain with walking, Chest Pain, Difficulty Breathing Lying Down, Dyspnea, Edema, Awakening Short of Breath, and Palpitations.  Gastrointestinal: Not Present- Bloody Stool, Constipation, Diarrhea, Hematemesis, Indigestion, Nausea and Vomiting.  Neurological: Not Present- Dizziness, Syncope and Weakness.    Physical Exam     Vitals:    04/05/24 0904   BP: 154/90   BP Site: Left arm   Patient Position: Sitting   Cuff Size: Medium   Pulse: 75   SpO2: 98%   Weight: 87.5 kg (193 lb)   Height: 1.88 m (6' 2)     Body mass index is 24.78 kg/m.    General: awake, alert, oriented x 3, no acute distress.  Neck: no JVD, normal carotid upstoke without bruits  Cardiovascular: regular rate and rhythm, nl S1 and S2, no murmur,  rubs or gallops     Lungs: CTA bilaterally,  no wheezing or rales.   Abdomen: soft, non-tender, non-distended, no hepatosplenomegaly, normoactive bowel sounds  Extremities: no clubbing, cyanosis, or edema , 2+ DP b/l  Neuro: no focal deficit, sensation intact. CNS 2-12 intact     Labs   12/17/2021: TSH  2.48, LFT normal except T. Bili 2.2   01/05/23: LFT normal except T. Bili 2.2 , TSH  1.98  12/15/2020: HgA1C 5.3%   02/28/24: TSH  2.08,  HgA1C 5.6%  Lipid Panel    Latest Reference Range & Units 12/17/21 09:09 01/05/23 11:12 02/28/24 12:07    Cholesterol <=199 mg/dL [phone]   HDL >=59 mg/dL 56 48 48   LDL Calculated 0 - 99 mg/dL 83 91 82   Triglycerides 34 - 149 mg/dL 75 883 98   Cholesterol / HDL Ratio Index 2.8 3.4 3.1   VLDL Calculated 10 - 40 mg/dL 15 23 20       Latest Reference Range & Units 12/17/21 09:09 01/05/23 11:12 02/28/24 12:07   AST <=41 U/L 32 26 36   ALT <=55 U/L 31 24 31    Alkaline Phosphatase 37 - 117 U/L 52 59 62   Albumin 3.5 - 5.0 g/dL 4.0 4.2 4.4   Protein Total 6.0 - 8.3 g/dL 6.7 7.3 7.8   Globulin 2.0 - 3.6 g/dL 2.7 3.1 3.4   Albumin/Globulin Ratio 0.9 -  2.2  1.5 1.4 1.3   Bilirubin Total 0.2 - 1.2 mg/dL 2.2 (H) 2.2 (H) 2.2 (H)   Bilirubin Direct 0.0 - 0.5 mg/dL   0.7 (H)   (H): Data is abnormally high  CBC   Lab Results   Component Value Date    WBC 5.84 02/28/2024    HGB 16.6 02/28/2024    HCT 50.0 (H) 02/28/2024    MCV 92.9 02/28/2024    PLT 303 02/28/2024      BMP:   Lab Results   Component Value Date    Sodium 144 02/28/2024    Sodium 141 12/17/2021    Potassium 4.0 02/28/2024    Potassium 4.1 12/17/2021    Chloride 108 02/28/2024    Chloride 107 12/17/2021    Chloride 104 08/09/2013    CO2 23 02/28/2024    CO2 25 12/17/2021    BUN 12 02/28/2024    BUN 12.0 12/17/2021    Creatinine 0.9 02/28/2024    Creatinine 0.8 12/17/2021    Creatinine 0.89 08/09/2013    Glucose 108 (H) 02/28/2024    Glucose 108 (H) 12/17/2021    Calcium  9.5 02/28/2024    Calcium  8.8 12/17/2021     INR No results found for: INR, PROTIME     EKG   ECG 04/27/2022: NSR, WNL.   ECG 12/29/23: SR at 66 bpm, with PVCs.     Follow-Up     Return in about 3 months (around 07/05/2024).    Warmest Regards,    Keyly Baldonado, MD PhD  [facility]

## 2024-04-13 NOTE — Progress Notes (Signed)
 Frederick [name]  DOB: [birth year]      Following instructions were reviewed with the patients wife and arrival time of 12pm confirmed.  You have a coronary CT angiogram scheduled for 04/16/2024, at 1pm.  Please arrive at 12pm.      Go to [facility] at [address], [place], [place] [address]. Park in the [place] garage then go to [facility] on the first floor of the Heart and Vascular building.     Check in at the desk and we will get you as soon as we are able.    Prior to your appointment DO NOT take any products with caffeine (like Coffee, Tea, etc). You may have a light breakfast, however, stop eating 4 hours prior to your appointment time.  Please continue to HYDRATE with water following your fluid restrictions if you have any.      For this appointment it is important that you wear a loose fitting, short sleeve shirt, without any zippers, sequins, metal, logos or pictures.    Take all of your home medications as prescribed, as long as your physician did not tell you otherwise.    .     If you have been prescribed Metoprolol  take it on arrival to your appointment.      If you need to reschedule for any reason, call scheduling at [phone].  Our nursing office number is [phone].     Office hours are Monday to Friday 8 am to 4 pm.    04/13/24

## 2024-04-16 ENCOUNTER — Ambulatory Visit
Admission: RE | Admit: 2024-04-16 | Discharge: 2024-04-16 | Disposition: A | Discharge status: Home / Self Care | Source: Ambulatory Visit | Attending: Cardiovascular Disease | Admitting: Cardiovascular Disease

## 2024-04-16 DIAGNOSIS — I2584 Coronary atherosclerosis due to calcified coronary lesion: Secondary | ICD-10-CM | POA: Insufficient documentation

## 2024-04-16 DIAGNOSIS — I2510 Atherosclerotic heart disease of native coronary artery without angina pectoris: Secondary | ICD-10-CM | POA: Insufficient documentation

## 2024-04-16 DIAGNOSIS — R9439 Abnormal result of other cardiovascular function study: Secondary | ICD-10-CM | POA: Insufficient documentation

## 2024-04-16 LAB — CREATININE POCT
Creatinine POCT: 1 mg/dL (ref 0.9–1.3)
GFR: >=60 mL/min/1.73 m2 (ref >=60.0)

## 2024-04-16 MED ORDER — METOPROLOL TARTRATE 25 MG PO TABS
ORAL_TABLET | ORAL | Status: AC
Start: 2024-04-16 — End: 2024-04-16
  Filled 2024-04-16: qty 1

## 2024-04-16 MED ORDER — NITROGLYCERIN 0.4 MG SL SUBL
0.4000 mg | SUBLINGUAL_TABLET | Freq: Once | SUBLINGUAL | Status: AC
Start: 2024-04-16 — End: 2024-04-16
  Administered 2024-04-16: 0.4 mg via SUBLINGUAL

## 2024-04-16 MED ORDER — METOPROLOL TARTRATE 25 MG PO TABS
25.0000 mg | ORAL_TABLET | Freq: Once | ORAL | Status: AC
Start: 2024-04-16 — End: 2024-04-16
  Administered 2024-04-16: 25 mg via ORAL

## 2024-04-16 MED ORDER — IOHEXOL 350 MG/ML IV SOLN
70.0000 mL | Freq: Once | INTRAVENOUS | Status: AC | PRN
Start: 2024-04-16 — End: 2024-04-16
  Administered 2024-04-16: 70 mL via INTRAVENOUS
  Filled 2024-04-16: qty 100

## 2024-04-16 MED ORDER — METOPROLOL TARTRATE 50 MG PO TABS
50.0000 mg | ORAL_TABLET | Freq: Once | ORAL | Status: DC
Start: 2024-04-16 — End: 2024-04-16

## 2024-04-16 MED ORDER — NITROGLYCERIN 0.4 MG SL SUBL
SUBLINGUAL_TABLET | SUBLINGUAL | Status: AC
Start: 2024-04-16 — End: 2024-04-16
  Filled 2024-04-16: qty 25

## 2024-04-16 NOTE — Discharge Instr - AVS First Page (Signed)
After the Examination:  You may resume your normal diet.    HYDRATE! It is important to increase your fluid intake (6-8 glasses of water, juice and limited caffeine products).  The contrast leaves your body by way of the kidneys through your urine.  It will not change the color of your urine.    During the scan you received nitroglycerin used to dilate the coronary arteries.  This has been known to dilate all blood vessels causing lightheadedness, dizziness, low blood pressure, and/or headaches.  The duration of the symptoms is approximately 45 minutes to 1 hour.     You may have been given some medication called metoprolol, a beta blocker used to slow the heart rate.  The medication stays in your body for approximately 6-8 hours.  The medication can cause dizziness, fatigue, drowsiness, and blood pressure changes.      Be sure to get up slowly when sitting and standing.      Avoid physically strenuous activities for the rest of the day.  Use caution when driving, operating heavy machinery, or performing other activities.     You can resume all medications as prescribed by your doctor unless informed by the staff today.  Check your heart rate and blood pressure prior to any cardiac medication administrations.  Follow your medication guidelines/instructions.        If you have any further questions and/or concerns contact us at [phone] or contact your provider.

## 2024-04-16 NOTE — Discharge Summary -  Nursing (Signed)
 Levaughn Puccinelli  01/05/1946      Outpatient Cardiac CT Angiogram    Admitted to radiology. Patient alert and oriented x4. Allergies, medication regimen, past medical history reviewed.    Patient placed on monitor. Orders per protocol/MD Jodean. Vitals assessed. GFR with in protocol parameter.     Cardiac CT Angiography, Nitroglycerin , Metoprolol  education and discharge instructions (see After the Examination Instructions ) provided and reviewed, patient verbalized understanding.     Patient medicated (See MAR).     Patient tolerated medication and examination without incident. Post scan, patient without pain, dizziness or discomfort. Patient discharged per protocol. Patient discharged ambulating with steady gait.     Temp:  [98.4 F (36.9 C)] 98.4 F (36.9 C)  Heart Rate:  [59-85] 59  Resp Rate:  [12-20] 12  BP: (164-178)/(80-95) 164/80        04/16/24   1:31 PM

## 2024-04-17 ENCOUNTER — Other Ambulatory Visit (INDEPENDENT_AMBULATORY_CARE_PROVIDER_SITE_OTHER): Payer: Self-pay | Admitting: Cardiovascular Disease

## 2024-04-17 ENCOUNTER — Ambulatory Visit
Admission: RE | Admit: 2024-04-17 | Discharge: 2024-04-17 | Disposition: A | Discharge status: Home / Self Care | Source: Ambulatory Visit | Attending: Cardiovascular Disease | Admitting: Cardiovascular Disease

## 2024-04-17 DIAGNOSIS — I2510 Atherosclerotic heart disease of native coronary artery without angina pectoris: Secondary | ICD-10-CM

## 2024-04-17 DIAGNOSIS — I2584 Coronary atherosclerosis due to calcified coronary lesion: Secondary | ICD-10-CM

## 2024-04-17 DIAGNOSIS — R9439 Abnormal result of other cardiovascular function study: Secondary | ICD-10-CM

## 2024-04-19 ENCOUNTER — Other Ambulatory Visit (FREE_STANDING_LABORATORY_FACILITY)

## 2024-04-19 ENCOUNTER — Telehealth (INDEPENDENT_AMBULATORY_CARE_PROVIDER_SITE_OTHER): Admitting: Family

## 2024-04-19 ENCOUNTER — Encounter (INDEPENDENT_AMBULATORY_CARE_PROVIDER_SITE_OTHER): Payer: Self-pay | Admitting: Family

## 2024-04-19 ENCOUNTER — Encounter (INDEPENDENT_AMBULATORY_CARE_PROVIDER_SITE_OTHER): Payer: Self-pay

## 2024-04-19 VITALS — BP 168/82 | HR 66 | Ht 74.0 in | Wt 193.0 lb

## 2024-04-19 DIAGNOSIS — E785 Hyperlipidemia, unspecified: Secondary | ICD-10-CM

## 2024-04-19 DIAGNOSIS — R931 Abnormal findings on diagnostic imaging of heart and coronary circulation: Secondary | ICD-10-CM | POA: Insufficient documentation

## 2024-04-19 DIAGNOSIS — I2510 Atherosclerotic heart disease of native coronary artery without angina pectoris: Secondary | ICD-10-CM

## 2024-04-19 DIAGNOSIS — Z01818 Encounter for other preprocedural examination: Secondary | ICD-10-CM

## 2024-04-19 LAB — LAB USE ONLY - CBC WITH DIFFERENTIAL
Absolute Basophils: 0.02 x10 3/uL (ref 0.00–0.08)
Absolute Eosinophils: 0.06 x10 3/uL (ref 0.00–0.44)
Absolute Immature Granulocytes: 0.01 x10 3/uL (ref 0.00–0.07)
Absolute Lymphocytes: 0.94 x10 3/uL (ref 0.42–3.22)
Absolute Monocytes: 0.41 x10 3/uL (ref 0.21–0.85)
Absolute Neutrophils: 5.32 x10 3/uL (ref 1.10–6.33)
Absolute nRBC: 0 x10 3/uL (ref <=0.00)
Basophils %: 0.3 %
Eosinophils %: 0.9 %
Hematocrit: 46.9 % (ref 37.6–49.6)
Hemoglobin: 15.6 g/dL (ref 12.5–17.1)
Immature Granulocytes %: 0.1 %
Lymphocytes %: 13.9 %
MCH: 30.8 pg (ref 25.1–33.5)
MCHC: 33.3 g/dL (ref 31.5–35.8)
MCV: 92.5 fL (ref 78.0–96.0)
MPV: 9 fL (ref 8.9–12.5)
Monocytes %: 6.1 %
Neutrophils %: 78.7 %
Platelet Count: 239 x10 3/uL (ref 142–346)
Preliminary Absolute Neutrophil Count: 5.32 x10 3/uL (ref 1.10–6.33)
RBC: 5.07 x10 6/uL (ref 4.20–5.90)
RDW: 13 % (ref 11–15)
WBC: 6.76 x10 3/uL (ref 3.10–9.50)
nRBC %: 0 /100{WBCs} (ref <=0.0)

## 2024-04-19 LAB — BASIC METABOLIC PANEL
Anion Gap: 9 (ref 5.0–15.0)
BUN: 17 mg/dL (ref 9–28)
CO2: 25 meq/L (ref 17–29)
Calcium: 9.5 mg/dL (ref 7.9–10.2)
Chloride: 107 meq/L (ref 99–111)
Creatinine: 0.9 mg/dL (ref 0.5–1.5)
GFR: >60 mL/min/1.73 m2 (ref >=60.0)
Glucose: 97 mg/dL (ref 70–100)
Hemolysis Index: 4 {index}
Potassium: 4.1 meq/L (ref 3.5–5.3)
Sodium: 141 meq/L (ref 135–145)

## 2024-04-19 LAB — LIPID PANEL
Cholesterol / HDL Ratio: 2.7 {index}
Cholesterol: 140 mg/dL (ref <=199)
HDL: 52 mg/dL (ref >=40)
LDL Calculated: 68 mg/dL (ref 0–99)
Triglycerides: 102 mg/dL (ref 34–149)
VLDL Calculated: 20 mg/dL (ref 10–40)

## 2024-04-19 MED ORDER — EZETIMIBE 10 MG PO TABS
10.0000 mg | ORAL_TABLET | Freq: Every day | ORAL | 3 refills | Status: AC
Start: 2024-04-19 — End: ?

## 2024-04-19 NOTE — Patient Instructions (Addendum)
 Schedule left heart catheterization  Start Zetia  10 mg daily for cholesterol, in addition to your atorvastatin .   Continue aspirin  and atorvastatin .  Continue Losartan .   Continue to check blood pressures at home, write down readings for next office visit.  Will repeat lipid panel in 3 months  Follow-up: post-cath

## 2024-04-19 NOTE — Progress Notes (Signed)
 [facility] V[facility]  (Patient is at [facility])    I had the pleasure of seeing Frederick [name] today for cardiovascular follow up. He is a pleasant 78 y.o. male who presents for follow-up.    Patient was last seen by Dr. Zhao on 04/05/24 for follow-up. At that time, patient was recommended to obtain a CCTA due to elevated CAC scoring, and to increase Atorvastatin  to 80 mg daily.    CCTA on 04/16/24 showed CAC score of 869, showing severe stenosis 70-99% in the mid LAD and RCA. Proximal LAD with moderate lesion. Left main with mild distal stenosis. Minimal left circumflex disease. Subsequent FFRCT analysis revealed severely abnormal FFR CT values of the mid LAD after the first diagonal and mid RCA.    Currently patient denies CP, shortness of breath, heart palpitations, dizziness, lightheadedness, syncope, and claudication.    He reports significant family history of CAD with CABG with his father.     Patient follows a Mediterranean diet and remains active by walking on a treadmill daily for 30-60 minutes. Denies exertional symptoms with activity.     Blood pressures are elevated this visit, but is normal at home in the  120s-130s/70s.     History:  CAD by CCTA 04/16/24  Hypertension  Hyperlipidemia   Back surgery    MEDICATIONS:     amLODIPine  (NORVASC ) 5 MG tablet, TAKE 1 TABLET DAILY    aspirin  EC 81 MG EC tablet, Take 1 tablet (81 mg) by mouth once daily    atorvastatin  (LIPITOR) 80 MG tablet, TAKE 1 TABLET NIGHTLY (Patient taking differently: Take 1 tablet (80 mg) by mouth once daily TAKE 1 TABLET NIGHTLY)    Cholecalciferol (Vitamin D3) 50 MCG (2000 UT) Tab, Take by mouth daily    Coenzyme Q10 (CoQ10) 200 MG Cap, Take 1 capsule (200 mg) by mouth once daily    eszopiclone  (LUNESTA ) 2 MG tablet, TAKE 1 TABLET NIGHTLY    losartan  (COZAAR ) 100 MG tablet, TAKE 1 TABLET DAILY    ezetimibe  (ZETIA ) 10 MG tablet, Take 1 tablet (10 mg) by mouth once daily    PHYSICAL EXAMINATION  Vital Signs: BP 168/82 (BP Site: Left arm,  Patient Position: Sitting, Cuff Size: Medium)   Pulse 66   Ht 1.88 m (6' 2)   Wt 87.5 kg (193 lb) Comment: patient reports home weight  SpO2 97%   BMI 24.78 kg/m    Chest: Clear to auscultation bilaterally  Cardiovascular: No murmurs or gallops.   Abdomen: Soft, nontender. No pulsatile masses or bruits.    Extremities: Warm without edema. Peripheral pulses are full and equal.    LABS:   Lab Results   Component Value Date    WBC 5.84 02/28/2024    HGB 16.6 02/28/2024    HCT 50.0 (H) 02/28/2024    PLT 303 02/28/2024    NA 144 02/28/2024    K 4.0 02/28/2024    BUN 12 02/28/2024    CREAT 1.0 04/16/2024    GLU 108 (H) 02/28/2024    CHOL 150 02/28/2024    TRIG 98 02/28/2024    HDL 48 02/28/2024    LDL 82 02/28/2024    AST 36 02/28/2024    ALT 31 02/28/2024    HGBA1C 5.6 02/28/2024    TSH 2.08 02/28/2024      Cardiac Testing/Interventions:  CCTA 04/16/24:  CARDIAC:     1. Multivessel coronary artery disease. It appears that the mid LAD and RCA  have severe degree  of stenosis with the degree of disease in the 70-99%  range. Proximal LAD has moderate lesion. Left main has mild degree of  distal stenosis. Left circumflex disease is minimal.     2. Calcium  score: 869, increased since 2021. 71% of patient's of the same  sex and similar age had the same or lower scores.    FFRCT analysis   IMPRESSION:      1. Severely abnormal FFR CT values of the mid LAD after the first diagonal  and mid RCA    Echocardiogram 01/25/24:  Summary    * Left ventricular systolic function is normal with an ejection fraction by  Biplane Method of Discs of  59 %.    * There is normal left ventricular diastolic function.    * The global longitudinal strain is -21.0 %, which is normal.    * Normal right ventricular systolic function.    * Normal atria.    * There is trace aortic regurgitation.    * Insufficient tricuspid regurgitation jet to estimate pulmonary artery  systolic pressure.    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    *  Compared to the prior study dated 02/15/2020, strain information is new and  the ascending aorta measures 4.0cm on today's study (previously 3.7cm).    Lexiscan  Nuclear Stress Test 01/16/24:  Summary    1. Abnormal stress and rest myocardial perfusion study with evidence of  infarct with minimal peri-infarct ischemia the territory of the RCA.    2. Subdiaphragmatic attenuation artifact limits interpretation of the  inferior wall.    3. Gated wall motion study demonstrates low normal left ventricular function  with calculated ejection fraction 51 %.    4. There appears to be mild hypokinesis in the mid to basal inferior wall  and basal inferoseptal wall.    5. 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    IMPRESSION/RECOMMENDATIONS: Frederick [name] is a 78 y.o. male who presents for follow up.     MV CAD by CCTA 04/16/24  CCTA on 04/16/24 showed CAC score of 869, showing severe stenosis 70-99% in the mid LAD and RCA. Proximal LAD with moderate lesion. Left main with mild distal stenosis. Minimal left circumflex disease. Subsequent FFRCT analysis revealed severely abnormal FFR CT values of the mid LAD after the first diagonal and mid RCA. I've discussed this result and case with his primary cardiologist Dr. Zhao, who recommended LHC for further evaluation. I've also discussed DAPT post-stent, along with cardiac rehab. I've also educated on scenarios to include surgical evaluation if indicated. All questions were answered.  The procedure of cardiac catheterization and moderate sedation was discussed in detail with the patient.  The risks of cardiac catheterization including, but not limited to, MI, CVA, damage to a coronary artery, bleeding complication were discussed as well.  The  patient verbalized understanding all information and has agreed to proceed.  The patient will have CBC and BMP drawn as scheduled.         Continue:   -ASA 81 mg daily  -Atorvastatin  80 mg daily  -Amlodipine  5 mg daily  -Losartan  100 mg daily    Hypertension  -Instructed to check blood pressures routinely at home, and to write down  readings for next visit.   -Continue Amlodipine  5 mg daily  -Continue Losartan  100 mg daily    Hyperlipidemia   LDL of 82 as of 02/28/24.   -Continue Atorvastatin  80 mg daily  -Will add on Zetia  10 mg daily for LDL goal <70.     Follow-up: post-cath

## 2024-04-20 DIAGNOSIS — U071 COVID-19: Secondary | ICD-10-CM | POA: Diagnosis not present

## 2024-04-21 LAB — LIPOPROTEIN A (LPA): Lipoprotein (a): 204 nmol/L — ABNORMAL HIGH (ref <75)

## 2024-04-23 ENCOUNTER — Ambulatory Visit (INDEPENDENT_AMBULATORY_CARE_PROVIDER_SITE_OTHER): Payer: Self-pay | Admitting: Family

## 2024-04-23 ENCOUNTER — Ambulatory Visit (INDEPENDENT_AMBULATORY_CARE_PROVIDER_SITE_OTHER): Payer: Self-pay

## 2024-04-24 DIAGNOSIS — E039 Hypothyroidism, unspecified: Secondary | ICD-10-CM | POA: Diagnosis not present

## 2024-04-24 DIAGNOSIS — I10 Essential (primary) hypertension: Secondary | ICD-10-CM | POA: Diagnosis not present

## 2024-04-25 ENCOUNTER — Ambulatory Visit

## 2024-04-25 NOTE — Pre-Procedure Instructions (Signed)
 Important Instructions Before Your Procedure  Procedure(s):  Left Heart Cath on 04/27/2024 at 1500 with Claria Packer, MD    ARRIVAL TIME: 1:00 PM    The date and/or time of your procedure may change.  Your MD's office will notify you, up until the business day before procedure, if there is any change to your procedure date or time.  Please don't hesitate to call your MD's office directly with any questions.    QUESTIONS:  If you have any questions about the information from this interview, please call the Pre-Procedure Nurse at: [phone].     Your procedure will take place at one of the following labs (all use the same registration desk):  Cardiac Cath Lab - Electrophysiology Lab - IR Lab, Ground Floor   [facility]  [address], [place] [address]    Directions:   From the G Level of the [facility], enter the [facility])    Continue down the corridor until it opens into a round lobby  The registration desk for CC/EP/IR will be on your left.    Valet Parking: (free service)   Valet parking is at the circular driveway marked [facility]; Leave your car with the [place] and continue across the atrium to the procedure check in. Wheelchairs are available upon request.     Day of procedure contact:    Call [phone] if you are running late, run into traffic or any other delay the day of your surgery/procedure    Eating and Drinking Instructions:   Follow your surgeon's instruction regarding your eating and drinking prior procedure  Follow the fasting instructions for procedures requiring anesthesia or sedation as outlined below:    Solids Clear Liquids/Ice Chips   No solid food after 11 pm the night before surgery. You may have clear liquids or ice chips up to 2 hours prior to the specified Procedure Time.    Examples of clear liquids include water, apple juice, sports drinks such as  Gatorade, and coffee or tea without cream or milk. Sugar or sweetener may be added.     Other Instructions:    You must have a responsible adult family or friend to take you home safely; we cannot send you home in an [place], or taxi-like service without a responsible adult with you.  You may have no more than 2 adults with you the day of surgery (No Visitors under the age of 66)  Bring a list of your medications (Do Not bring any medications from home)  Bring photo ID, insurance card, and method of payment for copay  Bring a case for glasses, hearing aids, dentures, marked with your name and date of birth.      Do not use any lotions, perfumes/colognes, cosmetics, creams, powders, or deodorant   Avoid shaving the surgical site                                    Wear loose comfortable clothing, easy to change in and out of  Wear no jewelry or metal; if jewelry is not removable, notify pre-op nurse when you arrive  If you become sick or your condition changes, immediately call your surgeon's office                         Avoid Alcohol/tobacco for at least 24 hours        MPORTANT You must visit the Preparing for Your Procedure guide link below for additional instructions including fasting guidelines and directions before your procedure. If you received fasting or skin preparation instructions from your surgeon or pre-procedural provider, please follow those specific instructions. The instructions here are general instructions that do not pertain to all patients.  [email or web address].[email or web address]    If the link doesn't open, please copy and paste in your browser

## 2024-04-25 NOTE — [facility] Phone Screen (Signed)
 [facility] Pre Procedure Evaluation    Pre-op phone visit requested by:   Reason for pre-op phone visit: Patient anticipating Left Heart Cath procedure.on 04/27/2024 at 1500: Arrival 1300.    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   02/22/2013     Dupuytren's contracture of left hand M72.0   08/09/2013     ERRONEOUS ENCOUNTER--DISREGARD    05/05/2017     Lumbar spondylosis F52.183   04/03/2019     Coronary artery disease involving native coronary artery of native heart without angina pectoris I25.10   01/22/2020     Essential hypertension I10   01/22/2020     DDD (degenerative disc disease), 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    Diverticulitis     Elevated coronary artery calcium  score 01/2020    H/O ABNORMAL CORONARY CALCIUM  SCORE. 01/22/20 CARD NOTE FROM DR. BONTEMPO IN EPIC. 02/15/20 ECHO RESULTS PENDING IN EPIC.     Hearing loss     wears hearing aids, about 20% bilaterally     Heart murmur 01/22/2020    basal systolic murmur - ASYMPTOMATIC.    Hyperacusis of both ears     participating in audiotherapy     Hyperlipidemia     MANAGED WITH MEDS.    Hypertension     MANAGED WITH MEDS. H/O WHITE COAT SYNDROME. BP at home 130/70's    Insomnia     on Rx    Low back pain     HX     Melanoma (CMS/HCC) 2022    Back of neck, removed by Dr. Jerilynn Seip (Derm)    Tinnitus 1993    on going     Past Surgical History[1]     Medication List            Accurate as of April 25, 2024 11:00 AM. Always use your most recent med list.                amLODIPine  5 MG tablet  TAKE 1 TABLET DAILY  Commonly known as: NORVASC   Medication Adjustments for Surgery: Take as prescribed     aspirin  EC 81 MG EC tablet  Take 1 tablet (81 mg) by mouth once daily  Medication Adjustments for Surgery: Take as prescribed     atorvastatin  80 MG tablet  TAKE 1 TABLET NIGHTLY  Commonly known as: LIPITOR  Medication Adjustments for Surgery: Take as prescribed  CoQ10 200 MG Caps  Take 1 capsule (200 mg) by mouth once daily  Medication Adjustments for Surgery: Stop 7 days before surgery     eszopiclone  2 MG tablet  TAKE 1 TABLET NIGHTLY  Commonly known as: LUNESTA   Medication Adjustments for Surgery: Take as prescribed     ezetimibe  10 MG tablet  Take 1 tablet (10 mg) by mouth once daily  Commonly known as: ZETIA   Medication Adjustments for Surgery: Take as prescribed     losartan  100 MG tablet  TAKE 1 TABLET DAILY  Commonly known as: COZAAR   Medication Adjustments for Surgery: Take as prescribed     Vitamin D3 50 MCG (2000 UT) Tabs  Take by mouth once daily  Medication Adjustments for Surgery: Hold day of surgery            Allergies[2]  Family History[3]  Social History     Occupational History    Not on file   Tobacco Use    Smoking status: Never     Passive exposure: Never    Smokeless tobacco: Never    Tobacco comments:     briefly tried pipes, cigars & cigarettes 1964-65; nothing since.   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 // Last used 04/25/2024     Sexual activity: Not on file           Exam Scores:   SDB score  OSA Risk Category: No Risk        STBUR score       PONV score  Nausea Risk: MODERATE RISK    MST score  MST Score: 0    PEN-FAST score       Frailty score  CFS Score: 3    CHADsVasc            Visit Vitals  Ht 1.88 m (6' 2)   Wt 87.5 kg (193 lb)   BMI 24.78 kg/m        Recent Labs   CBC (last 180 days) 02/28/24  1207 04/19/24  1514   WBC 5.84 6.76   RBC 5.38 5.07   Hemoglobin 16.6 15.6   Hematocrit 50.0* 46.9   MCV 92.9 92.5   MCH 30.9 30.8   MCHC 33.2 33.3   RDW 13 13   Platelet Count 303 239   MPV 9.8 9.0   nRBC % 0.0 0.0   Absolute nRBC 0.00 0.00     Recent Labs   BMP (last 180 days) 02/28/24  1207 04/16/24  1239 04/19/24  1514   Glucose 108*  --  97   BUN 12  --  17   Creatinine 0.9  --  0.9   Creatinine POCT  --  1.0  --    Sodium 144  --  141   Potassium 4.0  --  4.1   Chloride 108  --  107   CO2 23  --  25   Calcium  9.5  --  9.5   Anion Gap 13.0  --  9.0   GFR >60.0 >=60.0 >60.0  Recent Labs   Other (last 180 days) 02/28/24  1207   TSH 2.08   Bilirubin, Total 2.2*   ALT 31   AST (SGOT) 36   Protein, Total 7.8   Hemoglobin A1C 5.6                      [1]   Past Surgical History:  Procedure Laterality Date    APPENDECTOMY (OPEN)  1973    COLONOSCOPY, DIAGNOSTIC (SCREENING)  02/2011    Last 04/2022 // q 63yrs    COLONOSCOPY, DIAGNOSTIC (SCREENING) N/A 08/24/2016    Procedure: COLONOSCOPY;  Surgeon: Glenis Coward, MD;  Location: [facility];  Service: Gastroenterology;  Laterality: N/A;  COLONOSCOPY    EXCISION, MELANOMA  2022    Back of the neck    EXCISION, SEBACEOUS CYST  12/2006    several on Back // All Benign    KNEE ARTHROSCOPY Left 09/2011    LAMINECTOMY, POSTERIOR LUMBAR, DECOMPRESSION, LEVEL 2 Bilateral 03/06/2020    Procedure: BILATERAL L2-L4 LAMINECTOMY;  Surgeon: Buel Jacquet, MD;  Location: [facility];  Service: Neurosurgery;  Laterality: Bilateral;  BILATERAL L2-L4 LAMINECTOMIES    RELEASE, DUPUYTREN'S  CONTRACTURE Right 03/2013    RELEASE, 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    ROTATOR CUFF REPAIR Left 09/2000    and shaving of bone spur   [2]   Allergies  Allergen Reactions    Pollen Extract    [3]   Family History  Problem Relation Name Age of Onset    Alzheimer's disease Mother      Heart attack Father Adnan Vanvoorhis     Coronary artery disease Father Alm JUDITHANN Mace         died at 61    Heart failure Father Alm JUDITHANN Mace     Valve Surgery Father Alm JUDITHANN Mace         at age 58 quad bypass    Stroke Paternal Grandfather Darrick Greenlaw         died age 59; had stroke in his 29s

## 2024-04-25 NOTE — [facility] Phone Screen (Deleted)
 [facility] Pre Procedure Evaluation    Pre-op phone visit requested by:   Reason for pre-op phone visit: Patient anticipating Left Heart Cath procedure.on 04/27/2024 at 1500: Arrival 1300.    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   02/22/2013     Dupuytren's contracture of left hand M72.0   08/09/2013     ERRONEOUS ENCOUNTER--DISREGARD    05/05/2017     Lumbar spondylosis F52.183   04/03/2019     Coronary artery disease involving native coronary artery of native heart without angina pectoris I25.10   01/22/2020     Essential hypertension I10   01/22/2020     DDD (degenerative disc disease), 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    Diverticulitis     Elevated coronary artery calcium  score 01/2020    H/O ABNORMAL CORONARY CALCIUM  SCORE. 01/22/20 CARD NOTE FROM DR. BONTEMPO IN EPIC. 02/15/20 ECHO RESULTS PENDING IN EPIC.     Hearing loss     wears hearing aids, about 20% bilaterally     Heart murmur 01/22/2020    basal systolic murmur - ASYMPTOMATIC.    Hyperacusis of both ears     participating in audiotherapy     Hyperlipidemia     MANAGED WITH MEDS.    Hypertension     MANAGED WITH MEDS. H/O WHITE COAT SYNDROME. BP at home 130/70's    Insomnia     on Rx    Low back pain     HX     Melanoma (CMS/HCC) 2022    Back of neck, removed by Dr. Jerilynn Seip (Derm)    Tinnitus 1993    on going     Past Surgical History[1]     Medication List            Accurate as of April 25, 2024 10:45 AM. Always use your most recent med list.                amLODIPine  5 MG tablet  TAKE 1 TABLET DAILY  Commonly known as: NORVASC   Medication Adjustments for Surgery: Take as prescribed     aspirin  EC 81 MG EC tablet  Take 1 tablet (81 mg) by mouth once daily  Medication Adjustments for Surgery: Take as prescribed     atorvastatin  80 MG tablet  TAKE 1 TABLET NIGHTLY  Commonly known as: LIPITOR  Medication Adjustments for Surgery: Take as prescribed  CoQ10 200 MG Caps  Take 1 capsule (200 mg) by mouth once daily  Medication Adjustments for Surgery: Stop 7 days before surgery     eszopiclone  2 MG tablet  TAKE 1 TABLET NIGHTLY  Commonly known as: LUNESTA   Medication Adjustments for Surgery: Take as prescribed     ezetimibe  10 MG tablet  Take 1 tablet (10 mg) by mouth once daily  Commonly known as: ZETIA   Medication Adjustments for Surgery: Take as prescribed     losartan  100 MG tablet  TAKE 1 TABLET DAILY  Commonly known as: COZAAR   Medication Adjustments for Surgery: Take as prescribed     Vitamin D3 50 MCG (2000 UT) Tabs  Take by mouth once daily  Medication Adjustments for Surgery: Hold day of surgery            Allergies[2]  Family History[3]  Social History     Occupational History    Not on file   Tobacco Use    Smoking status: Never     Passive exposure: Never    Smokeless tobacco: Never    Tobacco comments:     briefly tried pipes, cigars & cigarettes 1964-65; nothing since.   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 // Hold for 7 Days  before procedure    Sexual activity: Not on file           Exam Scores:   SDB score  OSA Risk Category: No Risk        STBUR score       PONV score  Nausea Risk: MODERATE RISK    MST score  MST Score: 0    PEN-FAST score       Frailty score  CFS Score: 3    CHADsVasc            Visit Vitals  Ht 1.88 m (6' 2)   Wt 87.5 kg (193 lb)   BMI 24.78 kg/m        Recent Labs   CBC (last 180 days) 02/28/24  1207 04/19/24  1514   WBC 5.84 6.76   RBC 5.38 5.07   Hemoglobin 16.6 15.6   Hematocrit 50.0* 46.9   MCV 92.9 92.5   MCH 30.9 30.8   MCHC 33.2 33.3   RDW 13 13   Platelet Count 303 239   MPV 9.8 9.0   nRBC % 0.0 0.0   Absolute nRBC 0.00 0.00     Recent Labs   BMP (last 180 days) 02/28/24  1207 04/16/24  1239 04/19/24  1514   Glucose 108*  --  97   BUN 12  --  17   Creatinine 0.9  --  0.9   Creatinine POCT  --  1.0  --    Sodium 144  --  141   Potassium 4.0  --  4.1   Chloride 108  --  107   CO2 23  --  25   Calcium  9.5  --  9.5   Anion Gap 13.0  --  9.0   GFR >60.0 >=60.0 >60.0  Recent Labs   Other (last 180 days) 02/28/24  1207   TSH 2.08   Bilirubin, Total 2.2*   ALT 31   AST (SGOT) 36   Protein, Total 7.8   Hemoglobin A1C 5.6                      [1]   Past Surgical History:  Procedure Laterality Date    APPENDECTOMY (OPEN)  1973    COLONOSCOPY, DIAGNOSTIC (SCREENING)  02/2011    Last 04/2022 // q 6yrs    COLONOSCOPY, DIAGNOSTIC (SCREENING) N/A 08/24/2016    Procedure: COLONOSCOPY;  Surgeon: Glenis Coward, MD;  Location: [facility];  Service: Gastroenterology;  Laterality: N/A;  COLONOSCOPY    EXCISION, MELANOMA  2022    Back of the neck    EXCISION, SEBACEOUS CYST  12/2006    several on Back // All Benign    KNEE ARTHROSCOPY Left 09/2011    LAMINECTOMY, POSTERIOR LUMBAR, DECOMPRESSION, LEVEL 2 Bilateral 03/06/2020    Procedure: BILATERAL L2-L4 LAMINECTOMY;  Surgeon: Buel Jacquet, MD;  Location:  MAIN OR;  Service: Neurosurgery;  Laterality: Bilateral;  BILATERAL L2-L4 LAMINECTOMIES    RELEASE,  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    RELEASE, 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    ROTATOR CUFF REPAIR Left 09/2000    and shaving of bone spur   [2]   Allergies  Allergen Reactions    Pollen Extract    [3]   Family History  Problem Relation Name Age of Onset    Alzheimer's disease Mother      Heart attack Father Marland Reine     Coronary artery disease Father Alm JUDITHANN Mace         died at 48    Heart failure Father Alm JUDITHANN Mace     Valve Surgery Father Alm JUDITHANN Mace         at age 40 quad bypass    Stroke Paternal Grandfather Kaesen Rodriguez         died age 59; had stroke in his 51s

## 2024-04-26 ENCOUNTER — Other Ambulatory Visit (INDEPENDENT_AMBULATORY_CARE_PROVIDER_SITE_OTHER): Payer: Self-pay

## 2024-04-27 ENCOUNTER — Encounter
Admission: RE | Disposition: A | Discharge status: Home / Self Care | Payer: Self-pay | Source: Ambulatory Visit | Attending: Student in an Organized Health Care Education/Training Program

## 2024-04-27 ENCOUNTER — Ambulatory Visit
Admission: RE | Admit: 2024-04-27 | Discharge: 2024-04-27 | Disposition: A | Discharge status: Home / Self Care | Source: Ambulatory Visit | Attending: Student in an Organized Health Care Education/Training Program | Admitting: Student in an Organized Health Care Education/Training Program

## 2024-04-27 DIAGNOSIS — Z955 Presence of coronary angioplasty implant and graft: Secondary | ICD-10-CM

## 2024-04-27 DIAGNOSIS — R931 Abnormal findings on diagnostic imaging of heart and coronary circulation: Secondary | ICD-10-CM | POA: Insufficient documentation

## 2024-04-27 DIAGNOSIS — Z9889 Other specified postprocedural states: Secondary | ICD-10-CM

## 2024-04-27 DIAGNOSIS — I2510 Atherosclerotic heart disease of native coronary artery without angina pectoris: Secondary | ICD-10-CM | POA: Insufficient documentation

## 2024-04-27 DIAGNOSIS — F17290 Nicotine dependence, other tobacco product, uncomplicated: Secondary | ICD-10-CM | POA: Insufficient documentation

## 2024-04-27 DIAGNOSIS — Z01810 Encounter for preprocedural cardiovascular examination: Secondary | ICD-10-CM

## 2024-04-27 HISTORY — PX: CORONARY STENT: CATH2032

## 2024-04-27 HISTORY — PX: CORONARY ANGIOGRAM OF NATIVE CORONARIES WITH LEFT VENTRICULAR EVALUATION: CATH2058

## 2024-04-27 HISTORY — PX: INTRAVASCULAR ULTRASOUND OF CORONARY (IVUS): CATH2046

## 2024-04-27 LAB — ACT KAOLIN POCT
ACT Kaolin POCT: 210 s — ABNORMAL HIGH (ref 74–147)
ACT Kaolin POCT: 239 s — ABNORMAL HIGH (ref 74–147)
ACT Kaolin POCT: 245 s — ABNORMAL HIGH (ref 74–147)
ACT Kaolin POCT: 291 s — ABNORMAL HIGH (ref 74–147)
ACT Kaolin POCT: 343 s — ABNORMAL HIGH (ref 74–147)

## 2024-04-27 SURGERY — Coronary Angiogram of Native Coronaries with Left Ventricular Evaluation
Anesthesia: Conscious Sedation | Site: Chest

## 2024-04-27 MED ORDER — HEPARIN SODIUM (PORCINE) 1000 UNIT/ML IJ SOLN
INTRAMUSCULAR | Status: AC
Start: 2024-04-27 — End: 2024-04-27
  Filled 2024-04-27: qty 10

## 2024-04-27 MED ORDER — SODIUM CHLORIDE 0.9 % IV SOLN
INTRAVENOUS | Status: AC
Start: 2024-04-27 — End: 2024-04-27
  Filled 2024-04-27: qty 1000

## 2024-04-27 MED ORDER — ASPIRIN 81 MG PO TBEC
81.0000 mg | DELAYED_RELEASE_TABLET | Freq: Every day | ORAL | Status: DC
Start: 2024-04-27 — End: 2024-04-27

## 2024-04-27 MED ORDER — ASPIRIN 325 MG PO TABS
ORAL_TABLET | ORAL | Status: AC | PRN
Start: 2024-04-27 — End: 2024-04-27
  Administered 2024-04-27: 325 mg via ORAL

## 2024-04-27 MED ORDER — NITROGLYCERIN IN D5W 200-5 MCG/ML-% IV SOLN
INTRAVENOUS | Status: AC
Start: 2024-04-27 — End: 2024-04-27
  Filled 2024-04-27: qty 250

## 2024-04-27 MED ORDER — FENTANYL CITRATE (PF) 50 MCG/ML IJ SOLN (WRAP)
INTRAMUSCULAR | Status: AC | PRN
Start: 2024-04-27 — End: 2024-04-27
  Administered 2024-04-27: 50 ug via INTRAVENOUS

## 2024-04-27 MED ORDER — HEPARIN (PORCINE) IN NACL 1000-0.9 UT/500ML-% IV SOLN - TABLE FLUSH (SEDATION NARRATOR)
INTRAVENOUS | Status: AC | PRN
Start: 2024-04-27 — End: 2024-04-27
  Administered 2024-04-27: 3000 [IU]

## 2024-04-27 MED ORDER — NITROGLYCERIN IN D5W 200-5 MCG/ML-% IV SOLN VIAL
INTRAVENOUS | Status: AC | PRN
Start: 2024-04-27 — End: 2024-04-27
  Administered 2024-04-27: 100 ug via INTRA_ARTERIAL

## 2024-04-27 MED ORDER — ATROPINE SULFATE 0.1 MG/ML IJ SOLN (WRAP)
INTRAMUSCULAR | Status: AC
Start: 2024-04-27 — End: 2024-04-27
  Filled 2024-04-27: qty 10

## 2024-04-27 MED ORDER — HEPARIN SODIUM (PORCINE) 1000 UNIT/ML IJ SOLN (WRAP)
INTRAMUSCULAR | Status: AC | PRN
Start: 2024-04-27 — End: 2024-04-27
  Administered 2024-04-27: 5000 [IU] via INTRAVENOUS

## 2024-04-27 MED ORDER — MIDAZOLAM HCL 1 MG/ML IJ SOLN (WRAP)
INTRAMUSCULAR | Status: AC | PRN
Start: 2024-04-27 — End: 2024-04-27
  Administered 2024-04-27: 1 mg via INTRAVENOUS

## 2024-04-27 MED ORDER — VERAPAMIL HCL 2.5 MG/ML IV SOLN
INTRAVENOUS | Status: AC
Start: 2024-04-27 — End: 2024-04-27
  Filled 2024-04-27: qty 4

## 2024-04-27 MED ORDER — DIPHENHYDRAMINE HCL 50 MG/ML IJ SOLN
INTRAMUSCULAR | Status: AC
Start: 2024-04-27 — End: 2024-04-27
  Filled 2024-04-27: qty 1

## 2024-04-27 MED ORDER — HEPARIN (PORCINE) IN NACL 2-0.9 UNIT/ML-% IJ SOLN (WRAP)
INTRAVENOUS | Status: AC
Start: 2024-04-27 — End: 2024-04-27
  Filled 2024-04-27: qty 500

## 2024-04-27 MED ORDER — SODIUM CHLORIDE 0.9 % IV SOLN
INTRAVENOUS | Status: DC | PRN
Start: 2024-04-27 — End: 2024-04-28
  Administered 2024-04-27: 100 mL/h via INTRAVENOUS

## 2024-04-27 MED ORDER — CLOPIDOGREL BISULFATE 300 MG PO TABS
ORAL_TABLET | ORAL | Status: AC | PRN
Start: 2024-04-27 — End: 2024-04-27
  Administered 2024-04-27: 600 mg via ORAL

## 2024-04-27 MED ORDER — VERAPAMIL HCL 2.5 MG/ML IV SOLN (IR NARRATOR)
INTRAVENOUS | Status: AC | PRN
Start: 2024-04-27 — End: 2024-04-27
  Administered 2024-04-27: 2.5 mg via INTRA_ARTERIAL

## 2024-04-27 MED ORDER — NITROGLYCERIN IN D5W 200-5 MCG/ML-% IV SOLN VIAL
INTRAVENOUS | Status: AC | PRN
Start: 2024-04-27 — End: 2024-04-27
  Administered 2024-04-27: 200 ug via INTRA_ARTERIAL

## 2024-04-27 MED ORDER — ASPIRIN 325 MG PO TABS
ORAL_TABLET | ORAL | Status: AC
Start: 2024-04-27 — End: 2024-04-27
  Filled 2024-04-27: qty 1

## 2024-04-27 MED ORDER — SODIUM CHLORIDE 0.9 % IV BOLUS
INTRAVENOUS | Status: DC | PRN
Start: 2024-04-27 — End: 2024-04-28
  Administered 2024-04-27: 250 mL via INTRAVENOUS

## 2024-04-27 MED ORDER — CLOPIDOGREL BISULFATE 75 MG PO TABS
75.0000 mg | ORAL_TABLET | Freq: Every day | ORAL | 3 refills | Status: AC
Start: 2024-04-28 — End: ?

## 2024-04-27 MED ORDER — IODIXANOL 320 MG/ML IV SOLN
INTRAVENOUS | Status: AC | PRN
Start: 2024-04-27 — End: 2024-04-27
  Administered 2024-04-27: 150 mL via INTRA_ARTERIAL

## 2024-04-27 MED ORDER — HEPARIN SODIUM (PORCINE) 1000 UNIT/ML IJ SOLN (WRAP)
INTRAMUSCULAR | Status: AC | PRN
Start: 2024-04-27 — End: 2024-04-27
  Administered 2024-04-27: 4000 [IU] via INTRAVENOUS

## 2024-04-27 MED ORDER — LOSARTAN POTASSIUM 25 MG PO TABS
100.0000 mg | ORAL_TABLET | Freq: Every day | ORAL | Status: DC
Start: 2024-04-27 — End: 2024-04-28
  Administered 2024-04-27: 100 mg via ORAL
  Filled 2024-04-27: qty 4

## 2024-04-27 MED ORDER — LIDOCAINE HCL 1 % IJ SOLN
INTRAMUSCULAR | Status: AC | PRN
Start: 2024-04-27 — End: 2024-04-27
  Administered 2024-04-27: 3 mL

## 2024-04-27 MED ORDER — FENTANYL CITRATE (PF) 50 MCG/ML IJ SOLN (WRAP)
INTRAMUSCULAR | Status: AC
Start: 2024-04-27 — End: 2024-04-27
  Filled 2024-04-27: qty 2

## 2024-04-27 MED ORDER — MIDAZOLAM HCL 1 MG/ML IJ SOLN (WRAP)
INTRAMUSCULAR | Status: AC
Start: 2024-04-27 — End: 2024-04-27
  Filled 2024-04-27: qty 2

## 2024-04-27 MED ORDER — CLOPIDOGREL BISULFATE 300 MG PO TABS
ORAL_TABLET | ORAL | Status: AC
Start: 2024-04-27 — End: 2024-04-27
  Filled 2024-04-27: qty 2

## 2024-04-27 MED ORDER — HEPARIN SODIUM (PORCINE) 1000 UNIT/ML IJ SOLN (WRAP)
INTRAMUSCULAR | Status: AC | PRN
Start: 2024-04-27 — End: 2024-04-27
  Administered 2024-04-27: 3000 [IU] via INTRAVENOUS

## 2024-04-27 MED ORDER — MIDAZOLAM HCL 1 MG/ML IJ SOLN (WRAP)
INTRAMUSCULAR | Status: AC | PRN
Start: 2024-04-27 — End: 2024-04-27
  Administered 2024-04-27: 2 mg via INTRAVENOUS

## 2024-04-27 MED ORDER — DIPHENHYDRAMINE HCL 50 MG/ML IJ SOLN
INTRAMUSCULAR | Status: AC | PRN
Start: 2024-04-27 — End: 2024-04-27
  Administered 2024-04-27: 25 mg via INTRAVENOUS

## 2024-04-27 SURGICAL SUPPLY — 19 items
BAG EQUIPMENT PERMANENT SLED PERMANENT SLED BAG MDU5 PLUS MOTORDRIVE (Drape) IMPLANT
CATHETER IVUS OPTICROSS HD 3FR X 135CM BAGLESS NON-CE (Catheter) IMPLANT
CATHETER OD5 FR L100 CM LARGE INNER LUMEN RADIOPAQUE SELECTIVE JL3.5 (Catheter Miscellaneous) IMPLANT
CATHETER OD5 FR L100 CM LARGE INNER LUMEN RADIOPAQUE SELECTIVE JR4 (Catheter Miscellaneous) IMPLANT
CATHETER OD6 FR L100 CM EBU3.5 CURVE LAUNCHER GUIDING LARGE LUMEN (Catheter) IMPLANT
CATHETER OD6 FR L100 CM STANDARD JL3.5 CURVE LAUNCHER GUIDING (Catheter) IMPLANT
CATHETER OD6 FR L100 CM STANDARD JR4 CURVE SHERPA NX BALANCED GUIDING (Catheter) IMPLANT
CATHETER ODSEC2.5 MM L144 CM EMERGE MONORAIL BALLOON DILATATION L12 MM (Balloons) IMPLANT
CATHETER ODSEC3.0 MM L144 CM EMERGE MONORAIL BALLOON DILATATION L12 MM (Balloons) IMPLANT
CATHETER ODSEC4 MM L143 CM EMERGE BALLOON DILATATION L12 MM XTRA (Balloons) IMPLANT
CATHETER ODSEC4 MM L143 CM EMERGE BALLOON DILATATION L20 MM XTRA (Balloons) IMPLANT
CATHETER ODSEC4 MM L143 CM EMERGEâ„¢ BALLOON DILATATION L12 MM XTRA (Balloons) ×2 IMPLANT
CATHETER ODSEC4 MM L143 CM EMERGEâ„¢ BALLOON DILATATION L20 MM XTRA (Balloons) ×2 IMPLANT
DEVICE COMPRESSION L24 CM REGULAR 2 BALLOON TRANSPARENT ADJUSTABLE (Lab Supplies) IMPLANT
GUIDEWIRE VASCULAR OD.36 MM L180 CM L3 CM RUNTHROUGH RADIOPAQUE EXTRA (Guidewire) IMPLANT
KIT INTRODUCER L10 CM .021 IN L35 MM FLEX STRAIGHT SHEATH DILATOR (Sheaths) IMPLANT
SYSTEM CORONARY STENT L23 MM L145 CM OD3.5 MM XIENCE SKYPOINT (Stent) IMPLANT
SYSTEM CORONARY STENT L38 MM L145 CM OD3.5 MM XIENCE SKYPOINT (Stent) IMPLANT
VALVE HEMOSTASIS COPILOT ID.096 IN 1 UNIT BLEEDBACK CONTROL (Procedure Accessories) IMPLANT

## 2024-04-27 NOTE — Discharge Instructions (Signed)
[facility]     Cardiac Rehabilitation     Your Key to Optimal Heart Health Through Education and Exercise   [facility]'s Cardiac Rehabilitation Program is a lifestyle management and behavior change program designed for individuals with heart disease. The program is offered at convenient locations throughout [place].     Who Can Participate?   Anyone who has a cardiac diagnosis or has undergone heart surgery can participate. Examples of cardiac diagnoses include heart attack (myocardial infarction), angina, heart failure and cardiomyopathy. Anyone who has undergone coronary bypass surgery, heart transplant, LVAD placement, heart valve repair or replacement, angioplasty, stenting, atherectomy, or other another surgical heart procedure can also participate.     Cardiac Rehab Secondary Prevention Program   Our clinical teams will develop an individualized education and exercise program for you based on a clinical assessment of your diagnosis, risk factors and knowledge level.   Our onsite programs are located at [facility], [facility] [place], [facility] and [facility]. You will receive individualized care to develop and assess your exercise progression, clinical condition, and educational needs.     As a participant in cardiac rehabilitation, you will:    Develop an increased understanding of your cardiovascular condition and treatment    Receive the support and guidance you need to make important lifestyle changes that will decrease        your disease risk    Improve your cardiovascular conditioning for work and recreational activities     Cardiac Rehabilitation Disease Management Program   This program is a self-pay option for people who may not have insurance benefits that offer coverage for cardiac rehabilitation. These sessions and educational opportunities are offered in the same format as that of our secondary prevention program. The  cost of this program is $300 for 12 sessions or $600 for 24 sessions.     Cardiac Wellness Program   Cardiac wellness is a bridge program for individuals who have completed the initial, comprehensive cardiac rehabilitation program and want to continue to exercise independently with some clinical supervision. This program provides a supportive environment for continued cardiac wellness efforts. The cost of this program is $300 for 12 sessions or $600 for 24 sessions. This program is currently offered at our [facility] and [facility] campuses.            What Are the Benefits of Cardiac Rehabilitation?   Lower blood pressure    *Improve circulation   Increased strength and endurance   *Improve weight control   Decreased cholesterol levels    *Better stress management   Improved flexibility and balance   *Improved self-confidence   Better diabetes management   *Expanded support network    Your Healthcare Team  [facility]'s team consists of experienced healthcare professionals including:   Exercise physiologists   Registered dietitians   Registered nurses   Physical therapists   Respiratory therapists  The clinical team offers a complete and personalized heart disease management program for you. The team will keep your providers informed of your progress and any clinical needs as they arise.    Education  [facility] offers educational opportunities to patients enrolled in the cardiac rehabilitation program. Educational offerings vary by location. Seminar topics may include:    Your heart and how it works   Exercise and your heart   Medication and heart health   Heart-healthy eating guidelines   Stress management      Fees and Insurance  Most insurance plans cover much of the cost of cardiac rehabilitation. However, the amount covered varies across insurance companies.   We encourage you to contact your insurance company for details about your plan's coverage for cardiac rehabilitation. Our team  will verify your benefits and inform you of your financial responsibility before you start our program.    How Can I Enroll?  If you are interested in learning more about our [facility]'s Cardiac Rehabilitation Program, please call one of the locations listed. A physician's referral is required to enroll in the program.    Our Locations  [facility] offers cardiac rehabilitation at convenient locations throughout [place].     [facility]  [facility] -    [address]   [facility]  [place], [place] [address]   [address] 1st floor  [phone]     [place], [place] [address]       [phone]    [facility]  [facility]  [address]   [address]  [place], [place] [address]  [place], [place] [address]  [phone]    [phone]              Scan here or visit  [email or web address] to get more information about our cardiac rehabilitation program

## 2024-04-27 NOTE — Progress Notes (Addendum)
 Chart reviewed prior to providing patient education. Pt education included using the How Your Heart Works sheet (added to AVS) and as noted below. Information reviewed with patient and spouse at bedside.     Status post PCI to the RCA with 1 DES and to the LAD with 1 DES. Right radial artery access.    Discussion regarding importance and significance of medications and possible side effects that require MD attention:  [x]  Aspirin  reviewed: home aspirin  continued  [x]  Anti-platelet therapy (clopidogrel (Plavix)/prasugrel HCl (Effient)/ticagrelor (Brilinta)): New Rx prescribed   [x]  ACEi/ARB/ARNI reviewed: home losartan  continued  []  Beta blocker reviewed: N/A, no h/o recent MI and bradycardia   [x]  Statin reviewed: home atorvastatin  continued   (Written Mytonomy education, added to the AVS, on the New Rx, reviewed)    [x]  Echo on 01/25/24 reviewed: EF 59%    Cardiac rehab:  [x]  Phase II - outpatient order referral   [x]  [facility] (location added to AVS)  [x]  Documented education - Reviewed AVS added brochure    [x]  Notified to follow-up with Cardiologist/APP (added to AVS): Dr. Arletta within ~ 7-10 days or as previously scheduled  [x]  AVS added - site care post procedure, discussed.  [x]  Reviewed AVS added - Diet_Cardiac information.  [x]  Reviewed stent card given to patient.  [x]  MyChart Mytonomy education assigned.    -Arlyne Marvel, BSN, RN, CV-BC  Patient Care Navigator  [email or web address]  [phone]

## 2024-04-27 NOTE — Discharge Summary (Signed)
 Pt remains stable, assisted oob ambulated in the halls and voided. Right radial puncture / incision site CDI, no bleeding/hematoma, PP palpable. Discharge instruction reviewed with patient including, medication, activity, diet, return visit to MD. Patient verbalized understanding. A copy of instructions along with prescription was given to patient. Pt was discharged home in stable condition

## 2024-04-27 NOTE — Progress Notes (Signed)
 H&P UPDATE WITH ASA/MALLAMPATTI        Date Time: 04/27/24 2:09 PM    PROCEDURE:    Left heart cath, possible percutaneous coronary intervention    INDICATIONS:    abnormal stress test, abnormal CCTA    H&P:    Frederick [name] is a 78 y.o. male who presents for planned left heart cath, coronary angiogram and possible PCI.    He reports that he has not had significant symptoms in the last several weeks and continues to exercise. He had a CCTA which demonstrated significant coronary artery disease in the RCA and LAD.     Past Medical History:   Diagnosis Date    Coronary artery disease     CTA 04/16/2024 showing severe stenosis 70-99% in the mid LAD and RCA.    Diverticulitis     Elevated coronary artery calcium  score 01/2020    H/O ABNORMAL CORONARY CALCIUM  SCORE. 01/22/20 CARD NOTE FROM DR. BONTEMPO IN EPIC. 02/15/20 ECHO RESULTS PENDING IN EPIC.     Hearing loss     wears hearing aids, about 20% bilaterally     Heart murmur 01/22/2020    basal systolic murmur - ASYMPTOMATIC.    Hyperacusis of both ears     participating in audiotherapy     Hyperlipidemia     MANAGED WITH MEDS.    Hypertension     MANAGED WITH MEDS. H/O WHITE COAT SYNDROME. BP at home 130/70's    Insomnia     on Rx    Low back pain     HX    Melanoma (CMS/HCC) 2022    Back of neck, removed by Dr. Jerilynn Seip (Derm)    Tinnitus 1993    on going     Past Surgical History[1]  family history includes Alzheimer's disease in his mother; Coronary artery disease in his father; Heart attack in his father; Heart failure in his father; Stroke in his paternal grandfather; Valve Surgery in his father.  Social History[2]  Allergies[3]  Current Medications[4]     Physical Exam:   There were no vitals filed for this visit.  No data recorded.      Constitutional: Cooperative, alert, no acute distress.  Neck:  JVP normal.  Cardiac: normal rate, regular rhythm. No murmurs, rubs, or gallops   Pulmonary: Clear to auscultation bilaterally, no wheezing, no rhonchi,  no rales.  Extremities: no pitting edema  edema.  Vascular:  +2 pulses in radial artery bilaterally, 2+ PT pulses bilaterally.    LABS:      Lab Results   Component Value Date    WBC 6.76 04/19/2024    HGB 15.6 04/19/2024    HCT 46.9 04/19/2024    PLT 239 04/19/2024    NA 141 04/19/2024    K 4.1 04/19/2024    CL 107 04/19/2024    CO2 25 04/19/2024    BUN 17 04/19/2024    CREAT 0.9 04/19/2024    EGFR >60.0 04/19/2024    GLU 97 04/19/2024       ASA PHYSICAL STATUS    Class 2 - Mild systemic disease, no functional limitations    MALLAMPATTI AIRWAY CLASSIFICATION    Class II: Visibility of hard and soft palate, upper portion of tonsils and uvula    PLANNED SEDATION:    ( ) NO SEDATION   (x) MODERATE SEDATION   ( ) DEEP SEDATION WITH ANESTHESIA     CONCLUSION:    The risks, benefits and alternatives  of the procedure have been discussed in detail and   he has indicated that he understands the procedure, indications, and risks inherent to the   procedure and is amenable to proceeding.  All questions were answered. Informed   consent was signed and verified.      Signed by: Domnick Cruise, MD         [1]   Past Surgical History:  Procedure Laterality Date    APPENDECTOMY (OPEN)  1973    COLONOSCOPY, DIAGNOSTIC (SCREENING)  02/2011    Last 04/2022 // q 45yrs    COLONOSCOPY, DIAGNOSTIC (SCREENING) N/A 08/24/2016    Procedure: COLONOSCOPY;  Surgeon: Glenis Coward, MD;  Location: [id number];  Service: Gastroenterology;  Laterality: N/A;  COLONOSCOPY    EXCISION, MELANOMA  2022    Back of the neck    EXCISION, SEBACEOUS CYST  12/2006    several on Back // All Benign    KNEE ARTHROSCOPY Left 09/2011    LAMINECTOMY, POSTERIOR LUMBAR, DECOMPRESSION, LEVEL 2 Bilateral 03/06/2020    Procedure: BILATERAL L2-L4 LAMINECTOMY;  Surgeon: Buel Jacquet, MD;  Location: [facility];  Service: Neurosurgery;  Laterality: Bilateral;  BILATERAL L2-L4 LAMINECTOMIES    RELEASE, 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 // Last used 04/25/2024     Social Drivers of Health     Financial Resource Strain: Low Risk (02/27/2024)    Overall Financial Resource Strain (CARDIA)     Difficulty of Paying Living Expenses: Not hard at all   Food Insecurity: No Food Insecurity (02/27/2024)    Hunger Vital Sign     Worried About Running Out of Food in the Last Year: Never true     Ran Out of Food in the Last Year: Never true   Transportation Needs: No Transportation Needs (02/27/2024)    PRAPARE - Therapist, art (Medical): No     Lack of Transportation (Non-Medical): No   Physical Activity: Sufficiently Active (02/27/2024)    Exercise Vital Sign     Days of Exercise per Week: 3 days     Minutes of Exercise per Session: 90 min   Stress: No Stress Concern Present (02/27/2024)    [employer] of Occupational Health - Occupational Stress Questionnaire     Feeling of Stress : Only a little   Social Connections: Unknown (05/09/2023)    Social Connection and Isolation Panel     Frequency of Communication with Friends and Family: Patient declined     Frequency of Social Gatherings with Friends and Family: Once a week  Attends Religious Services: Never     Active Member of Clubs or Organizations: No     Attends Banker Meetings: Never     Marital  Status: Married   Catering manager Violence: Not At Risk (02/27/2024)    Humiliation, Afraid, Rape, and Kick questionnaire     Fear of Current or Ex-Partner: No     Emotionally Abused: No     Physically Abused: No     Sexually Abused: No   Housing Stability: Not At Risk (02/27/2024)    Housing Stability NCSS     Do you have housing?: Yes     Are you worried about losing your housing?: No   [3]   Allergies  Allergen Reactions    Pollen Extract    [4]   No current facility-administered medications for this encounter.

## 2024-04-27 NOTE — Plan of Care (Signed)
CHECKLIST - SAME DAY DISCHARGE PCI (RN fill out after MD/APP approval)    Pre-procedure: non ACS, no complicated co-morbid cardiac/medical conditions   Procedure: No complications - hemodynamic compromise, high contrast use, thrombotic lesion, use of GP llb/lla inhibitors  Access site: Radial or Femoral access with successful closure device/hemostasis achieved in lab  Patient factors: Shared decision making with patient regarding option for same-day discharge and patient interested in this option      [x]Above information was reviewed with Physician and patient is deemed safe for same day discharge.      CHECKLIST - SAME DAY DISCHARGE - PCI (To be completed by [facility] Nurses)    [x] Patient is willing to be discharged the same-day as procedure  [x] Patient lives within 60 minutes of a hospital, has a working phone and adult supervision at home  [x] Anticipated discharge at a reasonable time as determined by MD  [x] Pt aware to make a 7-10 day office visit follow-up with patient's primary cardiologist or discharging physician  [x] Patient instructions given regarding handling access site issues or other potential complications  [x] Antithrombotic medication filled prior to leaving [facility] (or patient's preferred pharmacy was called and they have doses available today for pick-up)  [x] Patient seen/re-evaluated prior to discharge and access site assessed by physician/designee or APP

## 2024-04-27 NOTE — Procedures (Signed)
 CARDIAC CATH LAB REPORT    04/27/24      Operators  Domnick Cruise, MD      PROCEDURES PERFORMED  Ultrasound guided right radial artery access   Left heart catheterization  Coronary angiography  IVUS guided PCI/DES of the right coronary artery   IVUS guided PCI/DES of the left anterior descending coronary artery      LOCATION  [facility]       INDICATION  Coronary artery disease   Abnormal coronary CTA      PRE-PROCEDURE DIAGNOSIS  Coronary artery disease  Abnormal CTA       POST-PROCEDURE DIAGNOSIS  Obstructive coronary artery disease in the LAD and RCA   S/p PCI/DES of the LAD and RCA         DESCRIPTION OF PROCEDURE(S)    After informed consent was obtained, electrocardiographic and hemodynamic monitoring was established.  The patient was prepped and draped in the usual sterile fashion.  Subcutaneous lidocaine  was administered over the access site(s).    LEFT HEART CATH AND CORONARY ANGIOGRAPHY  Access:  Right radial artery. Ultrasound guidance and Seldinger technique. 24F Terumo slender glidesheath.  Anti-spasmotics:  2.5 mg verapamil were administered intra-arterially through the sheath.  0.035 J-tipped wire was used to advance and exchange diagnostic catheters.  The coronary ostia were selectively engaged and cineangiography was performed in a variety of axial and hemi-axial projections.  The aortic valve was crossed. Left ventricle and aortic pressures were obtained.  Diagnostic catheters used: 5 F Judkins right and Judkins L 3.5      PERCUTANEOUS CORONARY INTERVENTION   The decision was made to intervene on the right coronary artery.   After the administration of therapeutic heparin to obtain an ACT 250-300 seconds, a 24F JR4 guide was engaged at the right coronary artery followed by the placement of a runthrough guidewire. The lesion was pre-dilated with 2.5 x 12 mm semi-compliant balloon. IVUS was used to visualize the lesion and the distal reference diameter was 3 mm. Then a 3.5 x 38 mm drug-eluting  stent was placed and was inflated sub-nominal pressure. IVUS was then used for visualization once more. The stent was then post-dilated with 4 x 12 mm non-compliant balloon in the proximal portion of the stent. There was excellent apposition of the stent and TIMI III flow at the end of the case with 10 % residual stenosis.     The decision was made to intervene on the left anterior descending artery.   After the administration of therapeutic heparin to obtain an ACT 250-300 seconds, a 24F JL 3.5 guide was engaged at the left coronary artery followed by the placement of a runthrough guidewire. The lesion was pre-dilated with 2.5 x 12 mm semi-compliant balloon. IVUS was used to visualize the lesion and the distal reference diameter was 3.5 mm. The lesion was pre-dilated once more with 3  x 12 mm semi-compliant balloon. Then a 3.5 x 38 mm drug-eluting stent was placed. The stent was then post-dilated with 4 x 20 mm non-compliant balloon. There was excellent apposition of the stent and TIMI III flow at the end of the case with 5 % residual stenosis.       All equipment was removed. The patient tolerated the procedure well with no complications.       Blood loss: less than 50 mL  Sedation: Fentanyl  150mcg and Versed  4mg  and 25 mg of benadryl      The patient was given moderate sedation with medications  as documented by a dedicated health professional under my face-to-face time supervision using physiologic monitoring for a total intra-service time of  2 hours      FINDINGS    Left Heart Catheterization  LVEDP 5 mmHg  AO with no gradient across AV  AO 138/60/87 mmHg    Coronary Angiography  Left Main: large caliber vessel which gives rise to the left anterior descending and left circumflex coronary arteries. No significant stenosis  Left anterior descending: large caliber vessel with 80-90% stenosis in the mide segment   Left Circumflex: large caliber vessel with mild to moderate luminal irregularities  Right: Dominant,  large caliber vessel. There is a 90% stenosis in the mid segment and 70% stenosis in the proximal segment        SUMMARY    2 vessel coronary artery disease   S/p PCI/DES of the LAD and RCA  Complications: None    RECOMMENDATIONS    Recovery in [facility] for 4 hours for radial artery hemostasis   Fluids:150 mL/hr of normal saline   Medication recommendations:  ASA 81 mg daily   Clopidogrel 75 mg daily   Follow up with Dr. Zhao for post-cath care     The findings were discussed with the referring provide, patient and/or family. All questions were answered            - - - - - - - - -  -- -  -    Domnick Cruise, MD

## 2024-04-27 NOTE — Progress Notes (Signed)
 CATH LAB PROCEDURE HANDOFF REPORT    Date Time: 04/27/24 5:28 PM    INDICATIONS:    abnormal stress test  POST PROCEDURE DEBRIEF:    Left heart cath, DES to RCA    Instrument/sponge/needle counts, if applicable:  N/A  Specimen labeling, read labels aloud, including name:  N/A  Any applicable equipment problems to be addressed:  N/A    Per MD:  Key concerns for recovery/management: bleeding/hematoma, chest pain, vital signs, arrhythmias, and hemodynamics  ALLERGIES:    Pollen extract   ACC BLEEDING RISK SCORE       MEDICAL HISTORY:    Medical History[1]   ACCESS:    59F sheath in right radial artery  Hemostasis: TR band, 13CC of air at 1730  Post procedure pulses: palpable in right arm/hand  Visual appearance: clean/dry/intact with good distal pulses   MEDICATIONS:    Lidocaine  subQ  Versed : 4 mg IV  Fentanyl : 150 mcg IV  Heparin:  15,000 units IV  Nitroglycerin : 200 mcg Ic  Verapamil:  2.5 mg IA  Loading Dose of: Clopidogrel 600 mg and ASA 325 mg PO  IV Drips:  VITALS:    HR:65           Rhythm: sinus rhythm       BP: 151/82   O2 SAT: 97%  RA      PROCEDURE DETAILS:    Outcomes: See physician note                                Last ACT:   ACT [place] POCT   Date/Time Value Ref Range Status   04/27/2024 05:23 PM 245 (H) 74 - 147 sec Final      Final Chest Pain Assessment::0/10    Report given to: bedside [facility] RN    See Physicians Op note/ Report for details    SAFETY CHECKLIST   I, Maylon Paterson, RN, along with Carlin Fila have validated the following safety critical items:    Patient ID band present  IV is patent  Pulses checked  Sites dressed  Patient transported on monitor  EKG attached  Pulse oximetry attached  Last vital signs stable and documented         [1]   Past Medical History:  Diagnosis Date    Coronary artery disease     CTA 04/16/2024 showing severe stenosis 70-99% in the mid LAD and RCA.    Diverticulitis     Elevated coronary artery calcium  score 01/2020    H/O ABNORMAL CORONARY CALCIUM  SCORE.  01/22/20 CARD NOTE FROM DR. BONTEMPO IN EPIC. 02/15/20 ECHO RESULTS PENDING IN EPIC.     Hearing loss     wears hearing aids, about 20% bilaterally     Heart murmur 01/22/2020    basal systolic murmur - ASYMPTOMATIC.    Hyperacusis of both ears     participating in audiotherapy     Hyperlipidemia     MANAGED WITH MEDS.    Hypertension     MANAGED WITH MEDS. H/O WHITE COAT SYNDROME. BP at home 130/70's    Insomnia     on Rx    Low back pain     HX    Melanoma (CMS/HCC) 2022    Back of neck, removed by Dr. Jerilynn Seip (Derm)    Tinnitus 1993    on going

## 2024-04-27 NOTE — Discharge Instr - AVS First Page (Addendum)
 Thank you for coming to see us  today!    Today we talked about:  Left heart cath   Coronary angiogram   Percutaneous coronary intervention (PCI) to the right coronary artery (RCA) with placement of one drug eluting stent (DES) and the left anterior descending artery (LAD) with placement of one DES.    These are the medication changes we discussed:  You will take aspirin  81 mg daily and clopidogrel (plavix) 75 mg daily     A referral has been sent to cardiac rehab     Manage your risks for heart disease!  Get regular exercise including 150 minutes of moderate exercise per week   Eat a healthy and balanced diet limiting saturated fats and fried food   Quit smoking!  Manage risk factors such as blood pressure, elevated cholesterol, and diabetes       Interventional Cardiovascular Admission and Recovery  Catheterization Discharge Instructions          Access Site: Right Radial Artery    Activity:  Do not lift anything greater than five (5) pounds and no strenuous activity for 24 hours.  Avoid weight bearing in the affected arm and prevent flexion, extension and manipulation of the wrist area (a wrist splint may be placed to assist with this) for at least 24 hours unless otherwise instructed by the physician. Remove the wrist splint 24 hours after your procedure.   No driving for 24 hours following your procedure.  Ask your doctor when you should return to work. Usually you can return within 48 hours for desk jobs and within 3 (three) to five (5) days for jobs requiring heavy labor. If you had a myocardial infarction (i.e. heart attack), your physician may request that you take a longer period off work.    Drink 6-8 glasses of water for at least the next two (2) days to help flush your body of the contrast used during the procedure.    Gradually increase your level of activity back to normal depending on how you feel.   You are encouraged to walk as much as you feel up to it, walking is a great way to increase your  endurance. Remember to allow for rest periods.       Access Site Care:  After 24 hours REMOVE the dressing before or during your shower.  Again, let the water passively flow over the site, wash gently with mild soap and water using your hand, then pat the area dry. You may shower daily.   Do not submerge access site in water (dishwashing, tub bath, pool, etc) until completely healed (usually five (5) days).   Do not rub, pick or scratch the area.   Do not apply creams, powders, lotions, or ointments to the site.   If desired, apply a regular sized Band-Aid to the puncture site and change it daily or as needed for five (5) days.  Observe for signs of infection:  redness, warmth, swelling, drainage, or temperature greater than 100.4 degrees F.  If you suspect infection call the doctor who performed the procedure.  Monitor for bleeding and swelling. If either occurs, sit or lie down, apply manual pressure directly over the access and call the doctor who performed the procedure.     Normal Observation:  You may feel tenderness at the puncture site.  May take ACETAMINOPHEN  (TYLENOL ) if needed.  May also use ice and elevation for site discomfort.  You may experience some mild bruising.  If you received procedural sedation:  Procedural sedation is medicine to ease discomfort, pain, and anxiety during a procedure. The medicine is often given through an IV (intravenous) line in your arm or hand. In some cases, the medicine may be taken by mouth or inhaled. While you are under sedation, you will likely be in a dream-like state so you may not remember anything afterward.   Why procedural sedation is used  Sedation is used for many types of procedures. The goal is to reduce pain, anxiety, and stressful memories of a procedure. It can help your healthcare provider treat you.   Risks of procedural sedation  Risks and possible side effects include:   Headache  Nausea and vomiting  Lowered rate of breathing  Changes in heart  rate and blood pressure (rare)  Side effects will likely go away shortly after the procedure. Your healthcare team will watch your heart rate and breathing during and after your sedation. This is to help prevent problems.   Your own risks may vary. They can be based on your age and your overall health. They also depend on the type of sedation you are given. Talk with your healthcare provider about the risks that apply most to you.     Call 9-1-1 if:  If you are unable to stop the bleeding with manual pressure. An arterial bleed may become an emergency if left unattended.   Your fingers/hand/arm has a loss of normal sensation, becomes cold, numb, painful or grayish in color.   You are experiencing unrelieved chest pain.    Cardiac Rehab   Your cardiologist may want you to go to cardiac rehab to help you recover. Cardiac rehab is a program that will help you get stronger and healthier. It also teaches you to make healthy lifestyle changes such as exercising and eating a heart-healthy diet. To enroll, cardiac rehabilitation program will contact you after receiving your referral.     Follow up:  Schedule your follow-up appointment with your doctor.

## 2024-04-27 NOTE — Progress Notes (Signed)
 Received report and patient into [facility] 20 s/p LHC. Assumed patient's care. Bedside handoff completed with RCIS, ID band verified, alert and oriented x 3, VSS, aldrete 10, pain 0 /10, rhythm NSR. Pt on room air. Call bell within reach. Oriented pt to to room. Hourly rounding implemented. Site Checks/Vitals/Pain assessment/Pulses checked per protocol. Educated patient on site precautions including but not limited to: bed rest, straight leg post procedure, head position, etc. Pt verbalized understanding of all education.

## 2024-04-28 LAB — ECG 12-LEAD
Atrial Rate: 59 {beats}/min
P Axis: 28 degrees
P-R Interval: 156 ms
Q-T Interval: 444 ms
QRS Duration: 82 ms
QTC Calculation (Bezet): 439 ms
R Axis: -46 degrees
T Axis: 84 degrees
Ventricular Rate: 59 {beats}/min

## 2024-04-30 ENCOUNTER — Telehealth (HOSPITAL_BASED_OUTPATIENT_CLINIC_OR_DEPARTMENT_OTHER): Payer: Self-pay

## 2024-04-30 NOTE — Telephone Encounter (Signed)
 Pt wife asking if pt is ok to have hepatitis vaccine; pt due for vaccine 2 this week   Please advise

## 2024-05-02 NOTE — Telephone Encounter (Signed)
 Patient should discuss with PCP. No cardiac contraindication for hepatitis vaccine , agree Dr. Zhao?    Status post PCI to the RCA with 1 DES and to the LAD with 1 DES. Right radial artery access.

## 2024-05-04 ENCOUNTER — Encounter: Payer: Self-pay | Admitting: Student in an Organized Health Care Education/Training Program

## 2024-05-07 NOTE — Telephone Encounter (Signed)
 This has been resolved, he has OV 05/09/24

## 2024-05-08 ENCOUNTER — Telehealth: Payer: Self-pay | Admitting: Internal Medicine

## 2024-05-08 DIAGNOSIS — Z23 Encounter for immunization: Secondary | ICD-10-CM

## 2024-05-08 NOTE — Telephone Encounter (Signed)
 Patient spouse called and stated that Frederick [name] would like to come in to get a high dose flu shot. Need order

## 2024-05-09 ENCOUNTER — Encounter (INDEPENDENT_AMBULATORY_CARE_PROVIDER_SITE_OTHER): Payer: Self-pay | Admitting: Adult Health

## 2024-05-09 ENCOUNTER — Ambulatory Visit (INDEPENDENT_AMBULATORY_CARE_PROVIDER_SITE_OTHER): Admitting: Adult Health

## 2024-05-09 ENCOUNTER — Ambulatory Visit (INDEPENDENT_AMBULATORY_CARE_PROVIDER_SITE_OTHER)

## 2024-05-09 VITALS — BP 167/96 | HR 68 | Ht 74.0 in | Wt 193.0 lb

## 2024-05-09 DIAGNOSIS — E782 Mixed hyperlipidemia: Secondary | ICD-10-CM

## 2024-05-09 DIAGNOSIS — Z23 Encounter for immunization: Secondary | ICD-10-CM

## 2024-05-09 DIAGNOSIS — I2510 Atherosclerotic heart disease of native coronary artery without angina pectoris: Secondary | ICD-10-CM

## 2024-05-09 DIAGNOSIS — Z955 Presence of coronary angioplasty implant and graft: Secondary | ICD-10-CM

## 2024-05-09 DIAGNOSIS — I10 Essential (primary) hypertension: Secondary | ICD-10-CM

## 2024-05-09 NOTE — Progress Notes (Signed)
 Patient tolerated vaccine well, no reaction noted.

## 2024-05-09 NOTE — Progress Notes (Signed)
 [place] [facility] [place] OFFICE VISIT    I had the pleasure of seeing [name] today for cardiovascular follow up. He is a pleasant 78 y.o. male who presents for cardiac cath follow-up.     Patient had abnormal corCTA. Underwent LHC 10/3, DES to LAD and RCA. Of note no symptoms prior to cath.     Today he says he is feeling well. He is back on the treadmill doing 15 miles. Feels great.     Patient denies CP, SOB, PND, orthopnea, palpitations, dizziness, syncope, or edema.      BP has been running 110-120's/70's on home log over the last week.     MEDICATIONS:     amLODIPine  (NORVASC ) 5 MG tablet, TAKE 1 TABLET DAILY    aspirin  EC 81 MG EC tablet, Take 1 tablet (81 mg) by mouth once daily    atorvastatin  (LIPITOR) 80 MG tablet, TAKE 1 TABLET NIGHTLY    Cholecalciferol (Vitamin D3) 50 MCG (2000 UT) Tab, Take by mouth once daily    clopidogrel (PLAVIX) 75 mg tablet, Take 1 tablet (75 mg) by mouth once daily    Coenzyme Q10 (CoQ10) 200 MG Cap, Take 1 capsule (200 mg) by mouth once daily    eszopiclone  (LUNESTA ) 2 MG tablet, TAKE 1 TABLET NIGHTLY    ezetimibe  (ZETIA ) 10 MG tablet, Take 1 tablet (10 mg) by mouth once daily    losartan  (COZAAR ) 100 MG tablet, TAKE 1 TABLET DAILY    PHYSICAL EXAMINATION  Vital Signs: BP (!) 167/96 (BP Site: Left arm, Patient Position: Sitting, Cuff Size: Medium)   Pulse 68   Ht 1.88 m (6' 2)   Wt 87.5 kg (193 lb)   SpO2 97%   BMI 24.78 kg/m    Chest: Clear to auscultation bilaterally  Cardiovascular: No murmurs or gallops.   Abdomen: Soft, nontender. No pulsatile masses or bruits.    Extremities: Warm without edema. Peripheral pulses are full and equal.  R radial site: CDI.     LABS:   Lab Results   Component Value Date    WBC 6.76 04/19/2024    HGB 15.6 04/19/2024    HCT 46.9 04/19/2024    PLT 239 04/19/2024    NA 141 04/19/2024    K 4.1 04/19/2024    BUN 17 04/19/2024    CREAT 0.9 04/19/2024    GLU 97 04/19/2024    CHOL 140 04/19/2024    TRIG 102 04/19/2024    HDL 52 04/19/2024     LDL 68 04/19/2024    LIPOPROTEINA 204 (H) 04/19/2024    AST 36 02/28/2024    ALT 31 02/28/2024    HGBA1C 5.6 02/28/2024    TSH 2.08 02/28/2024        IMPRESSION/RECOMMENDATIONS: Frederick [name] is a 78 y.o. male who presents for post-cath follow-up.     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 Feels well with post procedure. Continues on asa, plavix, statin. His baseline exercise tolerance is excellent- deferring cardiac rehab.     - HLD, LDL 68 on lipitor and zetia .     - HTN w/ [facility]- good control on home log.     - TAA, 4 cm     Plan   - continue same medications   - continue exercise regimen   - BP goal <130/80, on average, at rest   - 6 month follow-up with Dr. Zhao- can cancel December NP visit     Incident to service performed with physician  present in the office in accordance with this patient's established plan of care.

## 2024-05-09 NOTE — Patient Instructions (Addendum)
-   continue same medications   - continue exercise regimen   - BP goal <130/80, on average, at rest   - 6 month follow-up with Dr. Zhao- can cancel December NP visit

## 2024-05-10 DIAGNOSIS — Z125 Encounter for screening for malignant neoplasm of prostate: Secondary | ICD-10-CM | POA: Diagnosis not present

## 2024-05-10 DIAGNOSIS — Z8673 Personal history of transient ischemic attack (TIA), and cerebral infarction without residual deficits: Secondary | ICD-10-CM | POA: Diagnosis not present

## 2024-05-10 DIAGNOSIS — Z0000 Encounter for general adult medical examination without abnormal findings: Secondary | ICD-10-CM | POA: Diagnosis not present

## 2024-05-10 DIAGNOSIS — Z23 Encounter for immunization: Secondary | ICD-10-CM | POA: Diagnosis not present

## 2024-05-10 DIAGNOSIS — H3552 Pigmentary retinal dystrophy: Secondary | ICD-10-CM | POA: Diagnosis not present

## 2024-05-10 DIAGNOSIS — E039 Hypothyroidism, unspecified: Secondary | ICD-10-CM | POA: Diagnosis not present

## 2024-05-10 DIAGNOSIS — Z1331 Encounter for screening for depression: Secondary | ICD-10-CM | POA: Diagnosis not present

## 2024-05-10 DIAGNOSIS — I10 Essential (primary) hypertension: Secondary | ICD-10-CM | POA: Diagnosis not present

## 2024-06-12 ENCOUNTER — Ambulatory Visit (INDEPENDENT_AMBULATORY_CARE_PROVIDER_SITE_OTHER): Admitting: Cardiovascular Disease

## 2024-06-25 ENCOUNTER — Encounter (INDEPENDENT_AMBULATORY_CARE_PROVIDER_SITE_OTHER): Payer: Self-pay | Admitting: Family

## 2024-07-08 NOTE — Progress Notes (Unsigned)
 [place] [facility] [place] OFFICE VISIT    I had the pleasure of seeing Mr. Wrightsman today for cardiovascular follow up.   Last seen in October. S/p LHC 10/3 with DES to LAD and RCA.   History of Present Illness  [name] is a 78 year old male with coronary artery disease who presents for follow-up after catheterization.    He was asymptomatic prior to catheterization and was surprised by the severity of his coronary disease. Since the procedure he has had no chest pain and no shortness of breath at rest or with exertion. He continues to run 30 to 40 minutes on the treadmill with incline and do resistance training on alternating days without symptoms.    His home blood pressures have been variable, with higher readings after exertion such as stair climbing. He often measures without resting first. He follows a Mediterranean, mostly vegetarian, low-sodium diet.  He has longstanding severe tinnitus. He drinks 1-2 glasses of wine per night to help with this.     Current medications are amlodipine  5 mg, aspirin , atorvastatin , Plavix , Zetia , and losartan . He has had no issues with Plavix .       Verbal consent obtained to record this visit.  MEDICATIONS:     amLODIPine  (NORVASC ) 5 MG tablet, TAKE 1 TABLET DAILY    aspirin  EC 81 MG EC tablet, Take 1 tablet (81 mg) by mouth once daily    atorvastatin  (LIPITOR) 80 MG tablet, TAKE 1 TABLET NIGHTLY    Cholecalciferol (Vitamin D3) 50 MCG (2000 UT) Tab, Take by mouth once daily    clopidogrel  (PLAVIX ) 75 mg tablet, Take 1 tablet (75 mg) by mouth once daily    Coenzyme Q10 (CoQ10) 200 MG Cap, Take 1 capsule (200 mg) by mouth once daily    eszopiclone  (LUNESTA ) 2 MG tablet, TAKE 1 TABLET NIGHTLY    ezetimibe  (ZETIA ) 10 MG tablet, Take 1 tablet (10 mg) by mouth once daily    losartan  (COZAAR ) 100 MG tablet, TAKE 1 TABLET DAILY    PHYSICAL EXAMINATION  Vital Signs: BP 160/77 (BP Site: Left arm, Patient Position: Sitting, Cuff Size: Large)   Ht 1.88 m (6' 2)   Wt 88.5 kg  (195 lb)   SpO2 98%   BMI 25.04 kg/m    Repeat BP 180/80 b/l  Chest: Clear to auscultation bilaterally  Cardiovascular: No murmurs or gallops.   Abdomen: Soft, nontender. No pulsatile masses or bruits.    Extremities: Warm without edema. Peripheral pulses are full and equal.     LABS:   Lab Results   Component Value Date    WBC 6.76 04/19/2024    HGB 15.6 04/19/2024    HCT 46.9 04/19/2024    PLT 239 04/19/2024    NA 141 04/19/2024    K 4.1 04/19/2024    BUN 17 04/19/2024    CREAT 0.9 04/19/2024    GLU 97 04/19/2024    CHOL 140 04/19/2024    TRIG 102 04/19/2024    HDL 52 04/19/2024    LDL 68 04/19/2024    LIPOPROTEINA 204 (H) 04/19/2024    AST 36 02/28/2024    ALT 31 02/28/2024    HGBA1C 5.6 02/28/2024    TSH 2.08 02/28/2024        IMPRESSION/RECOMMENDATIONS: Mr. Eltzroth is a 78 y.o. male who presents for follow up. Patient is doing well post cath. BP running high. Will adjust dose with follow up BP log.     -2 vessel CAD s/p  DES to LAD/RCA 04/27/24. Mild to mod LCx dx.  Deferred cardiac rehab.   -HLD  -HTN, running high. Goal closer to 120/80.   -TAA, 4cm.     Plan:  -Change losartan  to olmesartan . Start with 20 mg daily but may need to consider 40 mg daily  -Send BP log in a few weeks.   -BMP 2 weeks after final olmesartan  dose.   -Plavix  till 04/27/25.   -ASA indefinitely.   -Maintain activity.   -Reduce ETOH intake.

## 2024-07-09 ENCOUNTER — Ambulatory Visit (INDEPENDENT_AMBULATORY_CARE_PROVIDER_SITE_OTHER): Admitting: Family

## 2024-07-09 ENCOUNTER — Encounter (INDEPENDENT_AMBULATORY_CARE_PROVIDER_SITE_OTHER): Payer: Self-pay | Admitting: Family

## 2024-07-09 VITALS — BP 160/77 | Ht 74.0 in | Wt 195.0 lb

## 2024-07-09 DIAGNOSIS — Z955 Presence of coronary angioplasty implant and graft: Secondary | ICD-10-CM

## 2024-07-09 DIAGNOSIS — I2510 Atherosclerotic heart disease of native coronary artery without angina pectoris: Secondary | ICD-10-CM

## 2024-07-09 DIAGNOSIS — E782 Mixed hyperlipidemia: Secondary | ICD-10-CM

## 2024-07-09 DIAGNOSIS — I10 Essential (primary) hypertension: Secondary | ICD-10-CM

## 2024-07-09 MED ORDER — OLMESARTAN MEDOXOMIL 20 MG PO TABS: 20.0000 mg | ORAL_TABLET | Freq: Every day | ORAL | 3 refills | Status: AC

## 2024-07-09 NOTE — Patient Instructions (Addendum)
 Plan:  -Change losartan  to olmesartan . Start with 20 mg daily but may need to consider 40 mg daily  -Send BP log in a few weeks.   -BMP 2 weeks after final olmesartan  dose.   -Maintain activity.   -Reduce ETOH.

## 2024-07-10 ENCOUNTER — Encounter (INDEPENDENT_AMBULATORY_CARE_PROVIDER_SITE_OTHER): Payer: Self-pay | Admitting: Family

## 2024-07-11 ENCOUNTER — Other Ambulatory Visit: Payer: Self-pay | Admitting: Internal Medicine

## 2024-07-11 DIAGNOSIS — I159 Secondary hypertension, unspecified: Secondary | ICD-10-CM

## 2024-07-11 DIAGNOSIS — G479 Sleep disorder, unspecified: Secondary | ICD-10-CM

## 2024-07-21 ENCOUNTER — Other Ambulatory Visit: Payer: Self-pay | Admitting: Internal Medicine

## 2024-07-21 DIAGNOSIS — G479 Sleep disorder, unspecified: Secondary | ICD-10-CM

## 2024-07-27 ENCOUNTER — Other Ambulatory Visit: Payer: Self-pay

## 2024-07-27 DIAGNOSIS — I159 Secondary hypertension, unspecified: Secondary | ICD-10-CM

## 2024-07-27 MED ORDER — AMLODIPINE BESYLATE 5 MG PO TABS: 5.0000 mg | ORAL_TABLET | Freq: Every day | ORAL | 0 refills | Status: AC

## 2024-07-30 ENCOUNTER — Ambulatory Visit: Payer: Medicare Other | Attending: Dermatology | Admitting: Dermatology

## 2024-07-30 ENCOUNTER — Encounter: Payer: Self-pay | Admitting: Dermatology

## 2024-07-30 VITALS — BP 136/76 | HR 65 | Temp 97.5°F | Resp 16 | Ht 74.0 in | Wt 196.0 lb

## 2024-07-30 DIAGNOSIS — Z85820 Personal history of malignant melanoma of skin: Secondary | ICD-10-CM | POA: Insufficient documentation

## 2024-07-30 DIAGNOSIS — Z1283 Encounter for screening for malignant neoplasm of skin: Secondary | ICD-10-CM | POA: Insufficient documentation

## 2024-07-30 NOTE — Progress Notes (Signed)
 [facility]    CC: Total body skin examination (TBSE)    HISTORY OF PRESENT ILLNESS: 79 y.o. male with history of melanoma of right posterior neck. Patient's wife noted irregular brown lesion on right posterior neck prompting biopsy of the lesion with dermatology. This showed a non mitogenic 0.36mm melanoma right neck s/p WLE with Dr Laurence October 2022.      Oncology History   Malignant melanoma of neck (CMS/HCC) (Resolved)   04/09/2021 Initial Diagnosis    Malignant melanoma of neck     04/09/2021 Biopsy    Biopsy of  right posterior neck showed 0.3 mm thick, non-ulcerated melanoma with 0 mitoses/mm2. Margins were involved by in situ only.        04/29/2021 Procedure    WLE with 1 cm margins with Dr. Randall Laurence.     04/12/2022 Cancer Staged    Staging form: Melanoma of the Skin, AJCC 8th Edition  - Pathologic: Stage IA (pT1a, pN0, cM0) - Signed by Nathanael Wendelyn RAMAN, MD on 04/12/2022       Patient denies any other personal history of melanoma. History of BCC right forehead s/p excision. At time of melanoma WLE, a BCC on the right postauricular was excised by Dr. Laurence.   States history of moderate outdoor sun exposure. Denies tanning bed use. Patient denies any family history of melanoma or pancreatic cancer. Retired oncologist on [employer]  Current Outpatient Medications   Medication Sig Dispense Refill    amLODIPine  (NORVASC ) 5 MG tablet Take 1 tablet (5 mg) by mouth once daily 90 tablet 0    aspirin  EC 81 MG EC tablet Take 1 tablet (81 mg) by mouth once daily 90 tablet 3    atorvastatin  (LIPITOR) 80 MG tablet TAKE 1 TABLET NIGHTLY 90 tablet 3    Cholecalciferol (Vitamin D3) 50 MCG (2000 UT) Tab Take by mouth once daily      clopidogrel  (PLAVIX ) 75 mg tablet Take 1 tablet (75 mg) by mouth once daily 90 tablet 3    Coenzyme Q10 (CoQ10) 200 MG Cap Take 1 capsule (200 mg) by mouth once daily      eszopiclone  (LUNESTA ) 2 MG tablet TAKE 1 TABLET NIGHTLY 90 tablet 0    ezetimibe  (ZETIA ) 10 MG  tablet Take 1 tablet (10 mg) by mouth once daily 90 tablet 3    olmesartan  (BENICAR ) 20 MG tablet Take 1 tablet (20 mg) by mouth once daily 90 tablet 3     No current facility-administered medications for this visit.     Allergies   Allergen Reactions    Pollen Extract      Past Medical History:   Diagnosis Date    Coronary artery disease     CTA 04/16/2024 showing severe stenosis 70-99% in the mid LAD and RCA.    Diverticulitis     Elevated coronary artery calcium  score 01/2020    H/O ABNORMAL CORONARY CALCIUM  SCORE. 01/22/20 CARD NOTE FROM DR. BONTEMPO IN EPIC. 02/15/20 ECHO RESULTS PENDING IN EPIC.     Hearing loss     wears hearing aids, about 20% bilaterally     Heart murmur 01/22/2020    basal systolic murmur - ASYMPTOMATIC.    Hyperacusis of both ears     participating in audiotherapy     Hyperlipidemia     MANAGED WITH MEDS.    Hypertension     MANAGED WITH MEDS. H/O WHITE COAT SYNDROME. BP at home 130/70's  Insomnia     on Rx    Low back pain     HX    Melanoma (CMS/HCC) 2022    Back of neck, removed by Dr. Jerilynn Seip (Derm)    Tinnitus 1993    on going     Past Surgical History:   Procedure Laterality Date    APPENDECTOMY (OPEN)  1973    COLONOSCOPY, DIAGNOSTIC (SCREENING)  02/2011    Last 04/2022 // q 15yrs    COLONOSCOPY, DIAGNOSTIC (SCREENING) N/A 08/24/2016    Procedure: COLONOSCOPY;  Surgeon: Glenis Coward, MD;  Location: [id number];  Service: Gastroenterology;  Laterality: N/A;  COLONOSCOPY    CORONARY ANGIOGRAM OF NATIVE CORONARIES WITH LEFT VENTRICULAR EVALUATION N/A 04/27/2024    Procedure: Coronary Angiogram of Native Coronaries with Left Ventricular Evaluation;  Surgeon: Claria Packer, MD;  Location: [facility];  Service: Cardiovascular;  Laterality: N/A;    CORONARY STENT  04/27/2024    Procedure: Coronary Stent;  Surgeon: Claria Packer, MD;  Location: [facility];  Service: Cardiovascular;;    EXCISION, MELANOMA  2022    Back of the neck    EXCISION, SEBACEOUS CYST   12/2006    several on Back // All Benign    INTRAVASCULAR ULTRASOUND OF CORONARY (IVUS)  04/27/2024    Procedure: Intravascular Ultrasound of Coronary (IVUS);  Surgeon: Claria Packer, MD;  Location: [facility];  Service: Cardiovascular;;    KNEE ARTHROSCOPY Left 09/2011    LAMINECTOMY, POSTERIOR LUMBAR, DECOMPRESSION, LEVEL 2 Bilateral 03/06/2020    Procedure: BILATERAL L2-L4 LAMINECTOMY;  Surgeon: Buel Jacquet, MD;  Location: [facility];  Service: Neurosurgery;  Laterality: Bilateral;  BILATERAL L2-L4 LAMINECTOMIES    RELEASE, 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    RELEASE, 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    ROTATOR CUFF REPAIR Left 09/2000    and shaving of bone spur     Family History   Problem Relation Name Age of Onset    Alzheimer's disease Mother      Heart attack Father Taylor Levick     Coronary artery disease Father Alm JUDITHANN Mace         died at 10    Heart failure Father Alm JUDITHANN Mace     Valve Surgery Father Alm JUDITHANN Mace         at age 28 quad bypass    Stroke Paternal Grandfather Trevione Wert         died age 52; had stroke in his 29s       REVIEW OF SYSTEMS  Const - no fever no chills   Integ - see HPI  Objective:   BP 136/76   Pulse 65   Temp 97.5 F (36.4 C) (Tympanic)   Resp 16   Ht 1.88 m (6' 2)   Wt 88.9 kg (196 lb)   SpO2 100%   BMI 25.16 kg/m     Head including face-   Right postauricular skin just over the mastoid prominence- well healed linear scar,no evidence of recurrence (BCC excised October 2022)  Right forehead- scar, no evidence of recurrence (site of BCC)   Neck- Right posterior neck- well healed V shaped scar, no evidence of recurrence (Stage IA melanoma excised October 2022)  Chest including breasts, and axillae- no lesions of concern  Abdomen- no lesions of concern  Buttocks- no lesions of concern  Back- no lesions of concern; right lateral back T5 lipomatous nodule 3cm non tender mobile   Right upper extremity-  no lesions of  concern  Left upper extremity-no lesions of concern  Right lower extremity- no lesions of concern  Left lower extremity- no lesions of concern  Examination of lymph node basins is completed, including the H and N, axilla and groin. There is no lymphadenopathy.    Pathology reports   Patient Name: Emari Demmer  Date of Birth: Nov 28, 1945  Accession #: [id number]  Date of Service: 04/09/2021     Diagnosis  Neck, right posterior:  Malignant melanoma, superficial spreading type, associated with a minute dermal nevus     Specimen type: Shave biopsy  Clark's level: III (3)  Approximate maximum thickness: 0.3 mm  Growth phase: Vertical  Mitotic rate: 0 per mm^2  Ulceration: Not identified  Peripheral margin: Involved by situ  Deep margin: Involved by situ via adnexa  Regression: Not identified  Lymphovascular invasion: Not identified  Perineural invasion: Not identified  Microscopic satellites: Not identified  Lymphocytic host response: Not identified  Pathologic stage: pT1a (pT AJCC 8th Ed.) (dtt)     Gross  Irregular, 7x4 mm, tan granular surface, inked. Bisected, totally submitted. (Aa)  Assessment & Plan:     1. 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 He is aware that while the risk of recurrence is low, the sites of recurrence include within the scar, between the scar and the lymph nodes, in the lymph nodes and rarely can present with distant spread to lung, liver, brain and other sites. We reviewed potential worrisome signs and symptoms of recurrence.    2. 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 Multiple benign nevi-Reassured patient of benign appearance    4. Skin cancer screening - no lesions concerning to warrant biopsy today    5. Longitudinal follow up alternate with Dr Landy 6 months  until 2027 ; NEXT VISIT Dr Landy July 2026; I will see him Jan 2027     Leeann Bady S. Nathanael, MD, FAAD  Medical Director, [facility]  [facility], [facility]  Associate Professor of Medicine, [facility] [employer]  [address], [place], [place] [address]  T [phone]  F [phone]  [email or web address]    CC: Dr. Jerilynn Landy (Derm)

## 2025-01-08 ENCOUNTER — Ambulatory Visit (INDEPENDENT_AMBULATORY_CARE_PROVIDER_SITE_OTHER): Admitting: Cardiovascular Disease

## 2025-02-26 ENCOUNTER — Encounter: Admitting: Internal Medicine
